# Patient Record
Sex: Female | Born: 1995 | Race: Black or African American | Hispanic: No | Marital: Single | Smoking: Former smoker
Health system: Southern US, Community
[De-identification: ages and names within clinical notes are randomized; demographics above are authoritative.]

## PROBLEM LIST (undated history)

## (undated) DIAGNOSIS — F32A Depression, unspecified: Secondary | ICD-10-CM

## (undated) DIAGNOSIS — F419 Anxiety disorder, unspecified: Secondary | ICD-10-CM

## (undated) DIAGNOSIS — F329 Major depressive disorder, single episode, unspecified: Secondary | ICD-10-CM

## (undated) DIAGNOSIS — J45909 Unspecified asthma, uncomplicated: Secondary | ICD-10-CM

## (undated) DIAGNOSIS — F209 Schizophrenia, unspecified: Secondary | ICD-10-CM

## (undated) HISTORY — PX: HERNIA REPAIR: SHX51

## (undated) HISTORY — PX: EUSTACHIAN TUBE DILATION: SHX6770

---
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
## 2015-01-30 ENCOUNTER — Emergency Department: Payer: Self-pay | Admitting: Emergency Medicine

## 2015-05-22 ENCOUNTER — Encounter: Payer: Self-pay | Admitting: Emergency Medicine

## 2015-05-22 ENCOUNTER — Emergency Department: Payer: Medicaid Other

## 2015-05-22 ENCOUNTER — Emergency Department
Admission: EM | Admit: 2015-05-22 | Discharge: 2015-05-22 | Disposition: A | Discharge status: Home / Self Care | Payer: Medicaid Other | Attending: Student | Admitting: Student

## 2015-05-22 DIAGNOSIS — Z3202 Encounter for pregnancy test, result negative: Secondary | ICD-10-CM | POA: Insufficient documentation

## 2015-05-22 DIAGNOSIS — S39012D Strain of muscle, fascia and tendon of lower back, subsequent encounter: Secondary | ICD-10-CM | POA: Diagnosis not present

## 2015-05-22 DIAGNOSIS — M545 Low back pain: Secondary | ICD-10-CM | POA: Diagnosis present

## 2015-05-22 DIAGNOSIS — Z720 Tobacco use: Secondary | ICD-10-CM | POA: Insufficient documentation

## 2015-05-22 DIAGNOSIS — X58XXXD Exposure to other specified factors, subsequent encounter: Secondary | ICD-10-CM | POA: Diagnosis not present

## 2015-05-22 LAB — URINALYSIS COMPLETE WITH MICROSCOPIC (ARMC ONLY)
Bacteria, UA: NONE SEEN
Bilirubin Urine: NEGATIVE
Glucose, UA: NEGATIVE mg/dL
Hgb urine dipstick: NEGATIVE
KETONES UR: NEGATIVE mg/dL
LEUKOCYTES UA: NEGATIVE
NITRITE: NEGATIVE
Protein, ur: NEGATIVE mg/dL
Specific Gravity, Urine: 1.019 (ref 1.005–1.030)
pH: 7 (ref 5.0–8.0)

## 2015-05-22 LAB — POCT PREGNANCY, URINE: PREG TEST UR: NEGATIVE

## 2015-05-22 MED ORDER — DIAZEPAM 5 MG PO TABS: 5.0000 mg | ORAL_TABLET | Freq: Three times a day (TID) | ORAL | Status: DC | PRN

## 2015-05-22 MED ORDER — PREDNISONE 20 MG PO TABS: 40.0000 mg | ORAL_TABLET | Freq: Every day | ORAL | Status: DC

## 2015-05-22 NOTE — ED Provider Notes (Signed)
[facility] Emergency Department Provider Note ____________________________________________  Time seen: Approximately 4:02 PM  I have reviewed the triage vital signs and the nursing notes.   HISTORY  Chief Complaint Back Pain    HPI Tracy Poole is a 19 y.o. femalepresents the ER for the complaint of low back pain. Patient states she has had a history of some low back pain since she was age 75 after pregnancy. Patient denies recent fall or injury. Patient states that this "flare "pain has been present for approximately 3 weeks. Patient states the pain is primarily with movement. Patient states the pain improves when laying on her left side and on soft bedding. Patient states that she was seen 3 weeks ago at a hospital in [place] and diagnosed with a muscle pain. States no xrays have been done on her back. Patient states that she's been taken 10 mg Flexeril as needed for pain which has not relieved her pain.  States that pain occasionally radiates into right buttocks. Patient denies numbness or tingling. Patient denies difficulty ambulating. Denies dysuria, abdominal pain or other back pain. Denies fever. Denies chest pain or shortness of breath. Reports last menstrual last week.     History reviewed. No pertinent past medical history.  There are no active problems to display for this patient.   Past Surgical History  Procedure Laterality Date  . Hernia repair      No current outpatient prescriptions on file. Flexeril  Allergies Review of patient's allergies indicates no known allergies.  No family history on file.  Social History History  Substance Use Topics  . Smoking status: Current Some Day Smoker  . Smokeless tobacco: Not on file  . Alcohol Use: No    Review of Systems Constitutional: No fever/chills Eyes: No visual changes. ENT: No sore throat. Cardiovascular: Denies chest pain. Respiratory: Denies shortness of  breath. Gastrointestinal: No abdominal pain.  No nausea, no vomiting.  No diarrhea.  No constipation. Genitourinary: Negative for dysuria. Musculoskeletal: Positive for back pain. Negative for neck pain, arm or leg pain Skin: Negative for rash. Neurological: Negative for headaches, dizziness, weakness, focal weakness or numbness.  10-point ROS otherwise negative.  ____________________________________________   PHYSICAL EXAM:                                                           --     VITAL SIGNS:  blood pressure 103/60, pulse 80, temperature 98.4 F (36.9 C), temperature source Oral, resp. rate 18, height 5\' 4"  (1.626 m), weight 197 lb (89.359 kg), last menstrual period 05/13/2015, SpO2 100 %.  Constitutional: Alert and oriented. Well appearing and in no acute distress.playing on cellphone in room. Eyes: Conjunctivae are normal. PERRL. EOMI. Head: Atraumatic. Nose: No congestion/rhinnorhea. Mouth/Throat: Mucous membranes are moist.  Oropharynx non-erythematous. Neck: No stridor.  No cervical spine tenderness to palpation. Hematological/Lymphatic/Immunilogical: No cervical lymphadenopathy. Cardiovascular: Normal rate, regular rhythm. Grossly normal heart sounds.  Good peripheral circulation. Respiratory: Normal respiratory effort.  No retractions. Lungs CTAB. Gastrointestinal: Soft and nontender. No distention. No abdominal bruits. No CVA tenderness.normal bowel sounds. Musculoskeletal: No lower extremity tenderness nor edema.  No joint effusions. No cervical or thoracic tenderness. Mild to moderate lumbar and right paralumbar tenderness with pain over the right greater sciatic notch. Changes positions quickly and  easily without difficulty or distress. Bilateral straight leg test negative. No saddle anesthesia. Steady gait. Distal pedal pulses equal bilaterally and easily found. NO CVA tenderness. Neurologic:  Normal speech and language. No gross focal neurologic  deficits are appreciated. Speech is normal. No gait instability. Skin:  Skin is warm, dry and intact. No rash noted. Psychiatric: Mood and affect are normal. Speech and behavior are normal.  ____________________________________________   LABS (all labs ordered are listed, but only abnormal results are displayed)  Labs Reviewed  URINALYSIS COMPLETEWITH MICROSCOPIC ([facility] ONLY) - Abnormal; Notable for the following:    Color, Urine YELLOW (*)    APPearance CLEAR (*)    Squamous Epithelial / LPF 0-5 (*)    All other components within normal limits  POCT PREGNANCY, URINE   ____________________________________________   RADIOLOGY  LUMBAR SPINE - COMPLETE 4+ VIEW  COMPARISON: CT of the abdomen and pelvis on 07/17/2013  FINDINGS: There is no evidence of lumbar spine fracture. Alignment is normal. Intervertebral disc spaces are maintained.  IMPRESSION: Negative.   Electronically Signed By: Norva Pavlov M.D. On: 05/22/2015 16:46 ____________________________________________   INITIAL IMPRESSION / ASSESSMENT AND PLAN / ED COURSE  Pertinent labs & imaging results that were available during my care of the patient were reviewed by me and considered in my medical decision making (see chart for details).  No acute distress. Very well-appearing patient. Presents the ER for complaints of low back pain with intermittent radiation down her right buttocks 3 weeks. Reports history of similar. Denies other complaints. Changes positions quickly and easily without distress.  Xray negative and urine negative for UTI. Suspect lumbosacral strain. Will treat with oral valium, STOP flexeril, and prednisone. Follow up with PCP. Pt agreed to plan.  ____________________________________________   FINAL CLINICAL IMPRESSION(S) / ED DIAGNOSES  Final diagnoses:  Lumbosacral strain, subsequent encounter  Low back pain without sciatica, unspecified back pain laterality      Renford Dills, NP 05/22/15 1734  Renford Dills, NP 05/22/15 1735  Gayla Doss, MD 05/22/15 2101

## 2015-05-22 NOTE — ED Notes (Signed)
Right lower back pain radiating to right leg , no injury .

## 2015-05-22 NOTE — ED Notes (Signed)
Lower back and right leg pain x3 weeks. Ambulatory to triage.

## 2015-05-22 NOTE — Discharge Instructions (Signed)
Take medication as prescribed. Alternate heat and ice for comfort. Stretch well. Avoid overly strenuous activity. Follow up with your primary care physician this week. Follow up with the above as needed.  Return to the ER for new or worsening concerns.  Back Exercises Back exercises help treat and prevent back injuries. The goal of back exercises is to increase the strength of your abdominal and back muscles and the flexibility of your back. These exercises should be started when you no longer have back pain. Back exercises include:  Pelvic Tilt. Lie on your back with your knees bent. Tilt your pelvis until the lower part of your back is against the floor. Hold this position 5 to 10 sec and repeat 5 to 10 times.  Knee to Chest. Pull first 1 knee up against your chest and hold for 20 to 30 seconds, repeat this with the other knee, and then both knees. This may be done with the other leg straight or bent, whichever feels better.  Sit-Ups or Curl-Ups. Bend your knees 90 degrees. Start with tilting your pelvis, and do a partial, slow sit-up, lifting your trunk only 30 to 45 degrees off the floor. Take at least 2 to 3 seconds for each sit-up. Do not do sit-ups with your knees out straight. If partial sit-ups are difficult, simply do the above but with only tightening your abdominal muscles and holding it as directed.  Hip-Lift. Lie on your back with your knees flexed 90 degrees. Push down with your feet and shoulders as you raise your hips a couple inches off the floor; hold for 10 seconds, repeat 5 to 10 times.  Back arches. Lie on your stomach, propping yourself up on bent elbows. Slowly press on your hands, causing an arch in your low back. Repeat 3 to 5 times. Any initial stiffness and discomfort should lessen with repetition over time.  Shoulder-Lifts. Lie face down with arms beside your body. Keep hips and torso pressed to floor as you slowly lift your head and shoulders off the floor. Do not  overdo your exercises, especially in the beginning. Exercises may cause you some mild back discomfort which lasts for a few minutes; however, if the pain is more severe, or lasts for more than 15 minutes, do not continue exercises until you see your caregiver. Improvement with exercise therapy for back problems is slow.  See your caregivers for assistance with developing a proper back exercise program. Document Released: 12/20/2004 Document Revised: 02/04/2012 Document Reviewed: 09/13/2011 [facility] Patient Information 2015 [place], [place]. This information is not intended to replace advice given to you by your health care provider. Make sure you discuss any questions you have with your health care provider.

## 2015-08-19 ENCOUNTER — Emergency Department
Admission: EM | Admit: 2015-08-19 | Discharge: 2015-08-19 | Discharge status: Against Medical Advice | Payer: Medicaid Other | Attending: Emergency Medicine | Admitting: Emergency Medicine

## 2015-08-19 ENCOUNTER — Encounter: Payer: Self-pay | Admitting: Emergency Medicine

## 2015-08-19 DIAGNOSIS — Z720 Tobacco use: Secondary | ICD-10-CM | POA: Diagnosis not present

## 2015-08-19 DIAGNOSIS — R1032 Left lower quadrant pain: Secondary | ICD-10-CM | POA: Diagnosis not present

## 2015-08-19 LAB — CBC WITH DIFFERENTIAL/PLATELET
BASOS PCT: 1 %
Basophils Absolute: 0 10*3/uL (ref 0–0.1)
Eosinophils Absolute: 0.2 10*3/uL (ref 0–0.7)
Eosinophils Relative: 3 %
HCT: 38.5 % (ref 35.0–47.0)
Hemoglobin: 13.1 g/dL (ref 12.0–16.0)
Lymphocytes Relative: 27 %
Lymphs Abs: 2.1 10*3/uL (ref 1.0–3.6)
MCH: 30.2 pg (ref 26.0–34.0)
MCHC: 34.1 g/dL (ref 32.0–36.0)
MCV: 88.7 fL (ref 80.0–100.0)
Monocytes Absolute: 0.6 10*3/uL (ref 0.2–0.9)
Monocytes Relative: 7 %
NEUTROS ABS: 4.8 10*3/uL (ref 1.4–6.5)
Neutrophils Relative %: 62 %
Platelets: 152 10*3/uL (ref 150–440)
RBC: 4.35 MIL/uL (ref 3.80–5.20)
RDW: 13.4 % (ref 11.5–14.5)
WBC: 7.7 10*3/uL (ref 3.6–11.0)

## 2015-08-19 LAB — URINALYSIS COMPLETE WITH MICROSCOPIC (ARMC ONLY)
BILIRUBIN URINE: NEGATIVE
Glucose, UA: NEGATIVE mg/dL
Hgb urine dipstick: NEGATIVE
Ketones, ur: NEGATIVE mg/dL
NITRITE: NEGATIVE
Protein, ur: NEGATIVE mg/dL
Specific Gravity, Urine: 1.006 (ref 1.005–1.030)
pH: 7 (ref 5.0–8.0)

## 2015-08-19 LAB — COMPREHENSIVE METABOLIC PANEL
ALBUMIN: 4.1 g/dL (ref 3.5–5.0)
ALT: 28 U/L (ref 14–54)
ANION GAP: 6 (ref 5–15)
AST: 23 U/L (ref 15–41)
Alkaline Phosphatase: 72 U/L (ref 38–126)
BUN: 9 mg/dL (ref 6–20)
CALCIUM: 9 mg/dL (ref 8.9–10.3)
CO2: 25 mmol/L (ref 22–32)
Chloride: 105 mmol/L (ref 101–111)
Creatinine, Ser: 0.68 mg/dL (ref 0.44–1.00)
GFR calc Af Amer: >60 mL/min (ref >60)
GFR calc non Af Amer: >60 mL/min (ref >60)
GLUCOSE: 103 mg/dL — AB (ref 65–99)
Potassium: 4 mmol/L (ref 3.5–5.1)
SODIUM: 136 mmol/L (ref 135–145)
Total Bilirubin: 0.9 mg/dL (ref 0.3–1.2)
Total Protein: 7 g/dL (ref 6.5–8.1)

## 2015-08-19 LAB — POCT PREGNANCY, URINE: Preg Test, Ur: NEGATIVE

## 2015-08-19 NOTE — ED Notes (Signed)
Pt to ED with c/o LLQ abd. Pain that radiates toward back, states pain was worse yesterday and a little better today, denies any n,v,d or urinary symptoms

## 2015-08-22 ENCOUNTER — Telehealth: Payer: Self-pay | Admitting: Emergency Medicine

## 2015-08-22 NOTE — ED Notes (Signed)
Called patient due to lwot to inquire about condition and follow up plans. Patient says she is feeling better now, but not she has a cold.  Her pcp is at [facility].  She agrees to call them and inform them of the ed visit and that we did labs here.

## 2016-04-07 ENCOUNTER — Emergency Department: Payer: Medicaid Other

## 2016-04-07 ENCOUNTER — Emergency Department
Admission: EM | Admit: 2016-04-07 | Discharge: 2016-04-07 | Disposition: A | Discharge status: Home / Self Care | Payer: Medicaid Other | Attending: Emergency Medicine | Admitting: Emergency Medicine

## 2016-04-07 ENCOUNTER — Encounter: Payer: Self-pay | Admitting: Emergency Medicine

## 2016-04-07 DIAGNOSIS — Y9389 Activity, other specified: Secondary | ICD-10-CM | POA: Insufficient documentation

## 2016-04-07 DIAGNOSIS — Y999 Unspecified external cause status: Secondary | ICD-10-CM | POA: Insufficient documentation

## 2016-04-07 DIAGNOSIS — S0990XA Unspecified injury of head, initial encounter: Secondary | ICD-10-CM

## 2016-04-07 DIAGNOSIS — Y929 Unspecified place or not applicable: Secondary | ICD-10-CM | POA: Diagnosis not present

## 2016-04-07 DIAGNOSIS — F17210 Nicotine dependence, cigarettes, uncomplicated: Secondary | ICD-10-CM | POA: Insufficient documentation

## 2016-04-07 LAB — POCT PREGNANCY, URINE: Preg Test, Ur: NEGATIVE

## 2016-04-07 MED ORDER — NAPROXEN 500 MG PO TABS: 500.0000 mg | ORAL_TABLET | Freq: Two times a day (BID) | ORAL | Status: DC

## 2016-04-07 NOTE — Discharge Instructions (Signed)
Concussion, Adult  A concussion, or closed-head injury, is a brain injury caused by a direct blow to the head or by a quick and sudden movement (jolt) of the head or neck. Concussions are usually not life-threatening. Even so, the effects of a concussion can be serious. If you have had a concussion before, you are more likely to experience concussion-like symptoms after a direct blow to the head.   CAUSES  · Direct blow to the head, such as from running into another player during a soccer game, being hit in a fight, or hitting your head on a hard surface.  · A jolt of the head or neck that causes the brain to move back and forth inside the skull, such as in a car crash.  SIGNS AND SYMPTOMS  The signs of a concussion can be hard to notice. Early on, they may be missed by you, family members, and health care providers. You may look fine but act or feel differently.  Symptoms are usually temporary, but they may last for days, weeks, or even longer. Some symptoms may appear right away while others may not show up for hours or days. Every head injury is different. Symptoms include:  · Mild to moderate headaches that will not go away.  · A feeling of pressure inside your head.  · Having more trouble than usual:    Learning or remembering things you have heard.    Answering questions.    Paying attention or concentrating.    Organizing daily tasks.    Making decisions and solving problems.  · Slowness in thinking, acting or reacting, speaking, or reading.  · Getting lost or being easily confused.  · Feeling tired all the time or lacking energy (fatigued).  · Feeling drowsy.  · Sleep disturbances.    Sleeping more than usual.    Sleeping less than usual.    Trouble falling asleep.    Trouble sleeping (insomnia).  · Loss of balance or feeling lightheaded or dizzy.  · Nausea or vomiting.  · Numbness or tingling.  · Increased sensitivity to:    Sounds.    Lights.    Distractions.  · Vision problems or eyes that tire  easily.  · Diminished sense of taste or smell.  · Ringing in the ears.  · Mood changes such as feeling sad or anxious.  · Becoming easily irritated or angry for little or no reason.  · Lack of motivation.  · Seeing or hearing things other people do not see or hear (hallucinations).  DIAGNOSIS  Your health care provider can usually diagnose a concussion based on a description of your injury and symptoms. He or she will ask whether you passed out (lost consciousness) and whether you are having trouble remembering events that happened right before and during your injury.  Your evaluation might include:  · A brain scan to look for signs of injury to the brain. Even if the test shows no injury, you may still have a concussion.  · Blood tests to be sure other problems are not present.  TREATMENT  · Concussions are usually treated in an emergency department, in urgent care, or at a clinic. You may need to stay in the hospital overnight for further treatment.  · Tell your health care provider if you are taking any medicines, including prescription medicines, over-the-counter medicines, and natural remedies. Some medicines, such as blood thinners (anticoagulants) and aspirin, may increase the chance of complications. Also tell your health care   provider whether you have had alcohol or are taking illegal drugs. This information may affect treatment.  · Your health care provider will send you home with important instructions to follow.  · How fast you will recover from a concussion depends on many factors. These factors include how severe your concussion is, what part of your brain was injured, your age, and how healthy you were before the concussion.  · Most people with mild injuries recover fully. Recovery can take time. In general, recovery is slower in older persons. Also, persons who have had a concussion in the past or have other medical problems may find that it takes longer to recover from their current injury.  HOME  CARE INSTRUCTIONS  General Instructions  · Carefully follow the directions your health care provider gave you.  · Only take over-the-counter or prescription medicines for pain, discomfort, or fever as directed by your health care provider.  · Take only those medicines that your health care provider has approved.  · Do not drink alcohol until your health care provider says you are well enough to do so. Alcohol and certain other drugs may slow your recovery and can put you at risk of further injury.  · If it is harder than usual to remember things, write them down.  · If you are easily distracted, try to do one thing at a time. For example, do not try to watch TV while fixing dinner.  · Talk with family members or close friends when making important decisions.  · Keep all follow-up appointments. Repeated evaluation of your symptoms is recommended for your recovery.  · Watch your symptoms and tell others to do the same. Complications sometimes occur after a concussion. Older adults with a brain injury may have a higher risk of serious complications, such as a blood clot on the brain.  · Tell your teachers, school nurse, school counselor, coach, athletic trainer, or work manager about your injury, symptoms, and restrictions. Tell them about what you can or cannot do. They should watch for:    Increased problems with attention or concentration.    Increased difficulty remembering or learning new information.    Increased time needed to complete tasks or assignments.    Increased irritability or decreased ability to cope with stress.    Increased symptoms.  · Rest. Rest helps the brain to heal. Make sure you:    Get plenty of sleep at night. Avoid staying up late at night.    Keep the same bedtime hours on weekends and weekdays.    Rest during the day. Take daytime naps or rest breaks when you feel tired.  · Limit activities that require a lot of thought or concentration. These include:    Doing homework or job-related  work.    Watching TV.    Working on the computer.  · Avoid any situation where there is potential for another head injury (football, hockey, soccer, basketball, martial arts, downhill snow sports and horseback riding). Your condition will get worse every time you experience a concussion. You should avoid these activities until you are evaluated by the appropriate follow-up health care providers.  Returning To Your Regular Activities  You will need to return to your normal activities slowly, not all at once. You must give your body and brain enough time for recovery.  · Do not return to sports or other athletic activities until your health care provider tells you it is safe to do so.  · Ask   your health care provider when you can drive, ride a bicycle, or operate heavy machinery. Your ability to react may be slower after a brain injury. Never do these activities if you are dizzy.  · Ask your health care provider about when you can return to work or school.  Preventing Another Concussion  It is very important to avoid another brain injury, especially before you have recovered. In rare cases, another injury can lead to permanent brain damage, brain swelling, or death. The risk of this is greatest during the first 7-10 days after a head injury. Avoid injuries by:  · Wearing a seat belt when riding in a car.  · Drinking alcohol only in moderation.  · Wearing a helmet when biking, skiing, skateboarding, skating, or doing similar activities.  · Avoiding activities that could lead to a second concussion, such as contact or recreational sports, until your health care provider says it is okay.  · Taking safety measures in your home.    Remove clutter and tripping hazards from floors and stairways.    Use grab bars in bathrooms and handrails by stairs.    Place non-slip mats on floors and in bathtubs.    Improve lighting in dim areas.  SEEK MEDICAL CARE IF:  · You have increased problems paying attention or  concentrating.  · You have increased difficulty remembering or learning new information.  · You need more time to complete tasks or assignments than before.  · You have increased irritability or decreased ability to cope with stress.  · You have more symptoms than before.  Seek medical care if you have any of the following symptoms for more than 2 weeks after your injury:  · Lasting (chronic) headaches.  · Dizziness or balance problems.  · Nausea.  · Vision problems.  · Increased sensitivity to noise or light.  · Depression or mood swings.  · Anxiety or irritability.  · Memory problems.  · Difficulty concentrating or paying attention.  · Sleep problems.  · Feeling tired all the time.  SEEK IMMEDIATE MEDICAL CARE IF:  · You have severe or worsening headaches. These may be a sign of a blood clot in the brain.  · You have weakness (even if only in one hand, leg, or part of the face).  · You have numbness.  · You have decreased coordination.  · You vomit repeatedly.  · You have increased sleepiness.  · One pupil is larger than the other.  · You have convulsions.  · You have slurred speech.  · You have increased confusion. This may be a sign of a blood clot in the brain.  · You have increased restlessness, agitation, or irritability.  · You are unable to recognize people or places.  · You have neck pain.  · It is difficult to wake you up.  · You have unusual behavior changes.  · You lose consciousness.  MAKE SURE YOU:  · Understand these instructions.  · Will watch your condition.  · Will get help right away if you are not doing well or get worse.     This information is not intended to replace advice given to you by your health care provider. Make sure you discuss any questions you have with your health care provider.     Document Released: 02/02/2004 Document Revised: 12/03/2014 Document Reviewed: 06/04/2013  [employer]vier Interactive Patient Education ©2016 [employer]
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
## 2016-04-07 NOTE — ED Notes (Signed)
Discussed discharge instructions, prescriptions, and follow-up care with patient. No questions or concerns at this time. Pt stable at discharge.  
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
## 2017-02-26 ENCOUNTER — Encounter: Payer: Self-pay | Admitting: Emergency Medicine

## 2017-02-26 ENCOUNTER — Emergency Department
Admission: EM | Admit: 2017-02-26 | Discharge: 2017-02-26 | Disposition: A | Discharge status: Home / Self Care | Payer: Medicaid Other | Attending: Emergency Medicine | Admitting: Emergency Medicine

## 2017-02-26 DIAGNOSIS — G8929 Other chronic pain: Secondary | ICD-10-CM | POA: Diagnosis not present

## 2017-02-26 DIAGNOSIS — M545 Low back pain, unspecified: Secondary | ICD-10-CM

## 2017-02-26 DIAGNOSIS — Z79899 Other long term (current) drug therapy: Secondary | ICD-10-CM | POA: Diagnosis not present

## 2017-02-26 DIAGNOSIS — R1012 Left upper quadrant pain: Secondary | ICD-10-CM

## 2017-02-26 DIAGNOSIS — F17210 Nicotine dependence, cigarettes, uncomplicated: Secondary | ICD-10-CM | POA: Diagnosis not present

## 2017-02-26 LAB — URINALYSIS, COMPLETE (UACMP) WITH MICROSCOPIC
Bacteria, UA: NONE SEEN
Bilirubin Urine: NEGATIVE
GLUCOSE, UA: NEGATIVE mg/dL
Hgb urine dipstick: NEGATIVE
Ketones, ur: NEGATIVE mg/dL
Leukocytes, UA: NEGATIVE
NITRITE: NEGATIVE
PH: 6 (ref 5.0–8.0)
Protein, ur: NEGATIVE mg/dL
Specific Gravity, Urine: 1.019 (ref 1.005–1.030)

## 2017-02-26 LAB — CBC
HCT: 37.8 % (ref 35.0–47.0)
Hemoglobin: 13 g/dL (ref 12.0–16.0)
MCH: 29.7 pg (ref 26.0–34.0)
MCHC: 34.3 g/dL (ref 32.0–36.0)
MCV: 86.7 fL (ref 80.0–100.0)
PLATELETS: 178 10*3/uL (ref 150–440)
RBC: 4.36 MIL/uL (ref 3.80–5.20)
RDW: 13.8 % (ref 11.5–14.5)
WBC: 9.2 10*3/uL (ref 3.6–11.0)

## 2017-02-26 LAB — COMPREHENSIVE METABOLIC PANEL
ALT: 39 U/L (ref 14–54)
AST: 33 U/L (ref 15–41)
Albumin: 4 g/dL (ref 3.5–5.0)
Alkaline Phosphatase: 69 U/L (ref 38–126)
Anion gap: 6 (ref 5–15)
BUN: 7 mg/dL (ref 6–20)
CALCIUM: 9 mg/dL (ref 8.9–10.3)
CO2: 25 mmol/L (ref 22–32)
CREATININE: 0.7 mg/dL (ref 0.44–1.00)
Chloride: 105 mmol/L (ref 101–111)
GFR calc Af Amer: >60 mL/min (ref >60)
GFR calc non Af Amer: >60 mL/min (ref >60)
Glucose, Bld: 79 mg/dL (ref 65–99)
Potassium: 3.3 mmol/L — ABNORMAL LOW (ref 3.5–5.1)
Sodium: 136 mmol/L (ref 135–145)
TOTAL PROTEIN: 7.2 g/dL (ref 6.5–8.1)
Total Bilirubin: 0.5 mg/dL (ref 0.3–1.2)

## 2017-02-26 LAB — POCT PREGNANCY, URINE: Preg Test, Ur: NEGATIVE

## 2017-02-26 LAB — LIPASE, BLOOD: Lipase: 20 U/L (ref 11–51)

## 2017-02-26 MED ORDER — FAMOTIDINE 20 MG PO TABS
40.0000 mg | ORAL_TABLET | Freq: Once | ORAL | Status: AC
  Administered 2017-02-26: 40 mg via ORAL
  Filled 2017-02-26: qty 2

## 2017-02-26 MED ORDER — GI COCKTAIL ~~LOC~~
30.0000 mL | Freq: Once | ORAL | Status: AC
  Administered 2017-02-26: 30 mL via ORAL
  Filled 2017-02-26: qty 30

## 2017-02-26 MED ORDER — FAMOTIDINE 40 MG PO TABS: 20.0000 mg | ORAL_TABLET | Freq: Two times a day (BID) | ORAL | 0 refills | Status: DC

## 2017-02-26 NOTE — Discharge Instructions (Signed)
Please take your antacid medication twice a day for a full month. Return to the emergency department for any new or worsening symptoms such as if she cannot eat or drink, worsening pain, or for any other concerns. Otherwise follow up with your primary care physician as needed.  It was a pleasure to take care of you today, and thank you for coming to our emergency department.  If you have any questions or concerns before leaving please ask the nurse to grab me and I'm more than happy to go through your aftercare instructions again.  If you were prescribed any opioid pain medication today such as Norco, Vicodin, Percocet, morphine, hydrocodone, or oxycodone please make sure you do not drive when you are taking this medication as it can alter your ability to drive safely.  If you have any concerns once you are home that you are not improving or are in fact getting worse before you can make it to your follow-up appointment, please do not hesitate to call 911 and come back for further evaluation.  Merrily Brittle MD  Results for orders placed or performed during the hospital encounter of 02/26/17  Lipase, blood  Result Value Ref Range   Lipase 20 11 - 51 U/L  Comprehensive metabolic panel  Result Value Ref Range   Sodium 136 135 - 145 mmol/L   Potassium 3.3 (L) 3.5 - 5.1 mmol/L   Chloride 105 101 - 111 mmol/L   CO2 25 22 - 32 mmol/L   Glucose, Bld 79 65 - 99 mg/dL   BUN 7 6 - 20 mg/dL   Creatinine, Ser 1.61 0.44 - 1.00 mg/dL   Calcium 9.0 8.9 - 09.6 mg/dL   Total Protein 7.2 6.5 - 8.1 g/dL   Albumin 4.0 3.5 - 5.0 g/dL   AST 33 15 - 41 U/L   ALT 39 14 - 54 U/L   Alkaline Phosphatase 69 38 - 126 U/L   Total Bilirubin 0.5 0.3 - 1.2 mg/dL   GFR calc non Af Amer >60 >60 mL/min   GFR calc Af Amer >60 >60 mL/min   Anion gap 6 5 - 15  CBC  Result Value Ref Range   WBC 9.2 3.6 - 11.0 K/uL   RBC 4.36 3.80 - 5.20 MIL/uL   Hemoglobin 13.0 12.0 - 16.0 g/dL   HCT 04.5 40.9 - 81.1 %   MCV 86.7 80.0  - 100.0 fL   MCH 29.7 26.0 - 34.0 pg   MCHC 34.3 32.0 - 36.0 g/dL   RDW 91.4 78.2 - 95.6 %   Platelets 178 150 - 440 K/uL  Urinalysis, Complete w Microscopic  Result Value Ref Range   Color, Urine YELLOW (A) YELLOW   APPearance CLEAR (A) CLEAR   Specific Gravity, Urine 1.019 1.005 - 1.030   pH 6.0 5.0 - 8.0   Glucose, UA NEGATIVE NEGATIVE mg/dL   Hgb urine dipstick NEGATIVE NEGATIVE   Bilirubin Urine NEGATIVE NEGATIVE   Ketones, ur NEGATIVE NEGATIVE mg/dL   Protein, ur NEGATIVE NEGATIVE mg/dL   Nitrite NEGATIVE NEGATIVE   Leukocytes, UA NEGATIVE NEGATIVE   RBC / HPF 0-5 0 - 5 RBC/hpf   WBC, UA 0-5 0 - 5 WBC/hpf   Bacteria, UA NONE SEEN NONE SEEN   Squamous Epithelial / LPF 0-5 (A) NONE SEEN   Mucous PRESENT   Pregnancy, urine POC  Result Value Ref Range   Preg Test, Ur NEGATIVE NEGATIVE

## 2017-02-26 NOTE — ED Triage Notes (Signed)
Patient to ER for c/o left mid abd pain. Patient states she has chronic back pain, has noticed pain with walking to her back, but now having pain to side as well with walking. Patient unsure of pregnancy status and states she wants blood test done to test for pregnancy.

## 2017-02-26 NOTE — ED Notes (Signed)
Discussed lab results with patient, as well as advised of status (wait time). Patient awaiting room for MD eval. No other needs at this time. Patient in no acute distress.

## 2017-02-26 NOTE — ED Provider Notes (Signed)
[facility] Emergency Department Provider Note  ____________________________________________   First MD Initiated Contact with Patient 02/26/17 [phone]     (approximate)  I have reviewed the triage vital signs and the nursing notes.   HISTORY  Chief Complaint Abdominal Pain    HPI Tracy Poole is a 21 y.o. female who self presents to the emergency department with 2 issues. Today while walking to get dinner she felt moderate severity aching left upper quadrant burning sensation. Pain waxes and wanes. Nothing in particular makes it better or worse. It was not different after eating. She has had normal bowel movements and flatus. She had an umbilical hernia repair in the past. She's had no fevers or chills. She also has a one-month history of aching low back pain and figured that since she was coming to the emergency department anyway she would like that evaluated. The pain is been intermittent aching cramping mild to moderate severity and nonradiating. She's never had x-rays. No trauma. No fevers or chills. No steroids. No intravenous drug use. She has been intermittently taking naproxen with minimal relief.   History reviewed. No pertinent past medical history.  There are no active problems to display for this patient.   Past Surgical History:  Procedure Laterality Date  . HERNIA REPAIR      Prior to Admission medications   Medication Sig Start Date End Date Taking? Authorizing Provider  diazepam (VALIUM) 5 MG tablet Take 1 tablet (5 mg total) by mouth every 8 (eight) hours as needed for muscle spasms (do not drive or operate machinery while taking as can cause drowsiness.). 05/22/15   Renford Dills, NP  famotidine (PEPCID) 40 MG tablet Take 0.5 tablets (20 mg total) by mouth 2 (two) times daily. 02/26/17 03/28/17  Merrily Brittle, MD  naproxen (NAPROSYN) 500 MG tablet Take 1 tablet (500 mg total) by mouth 2 (two) times daily with a meal. 04/07/16   Christella Scheuermann,  PA-C  predniSONE (DELTASONE) 20 MG tablet Take 2 tablets (40 mg total) by mouth daily. 05/22/15   Renford Dills, NP    Allergies Patient has no known allergies.  No family history on file.  Social History Social History  Substance Use Topics  . Smoking status: Current Some Day Smoker    Packs/day: 1.00    Types: Cigarettes  . Smokeless tobacco: Never Used  . Alcohol use No    Review of Systems Constitutional: No fever/chills Eyes: No visual changes. ENT: No sore throat. Cardiovascular: Denies chest pain. Respiratory: Denies shortness of breath. Gastrointestinal: Positive abdominal pain.  No nausea, no vomiting.  No diarrhea.  No constipation. Genitourinary: Negative for dysuria. Musculoskeletal: Positive for back pain. Skin: Negative for rash. Neurological: Negative for headaches, focal weakness or numbness.  10-point ROS otherwise negative.  ____________________________________________   PHYSICAL EXAM:  VITAL SIGNS: ED Triage Vitals  Enc Vitals Group     BP 02/26/17 0010 (!) 146/74     Pulse Rate 02/26/17 0010 93     Resp 02/26/17 0010 18     Temp 02/26/17 0010 98.3 F (36.8 C)     Temp Source 02/26/17 0010 Oral     SpO2 02/26/17 0010 100 %     Weight 02/26/17 0011 215 lb (97.5 kg)     Height 02/26/17 0011  (1.626 m)     Head Circumference --      Peak Flow --      Pain Score 02/26/17 0011 7  Pain Loc --      Pain Edu? --      Excl. in GC? --     Constitutional: Alert and oriented x 4 well appearing nontoxic no diaphoresis speaks in full, clear sentences Eyes: PERRL EOMI. Head: Atraumatic. Nose: No congestion/rhinnorhea. Mouth/Throat: No trismus Neck: No stridor.   Cardiovascular: Normal rate, regular rhythm. Grossly normal heart sounds.  Good peripheral circulation. Respiratory: Normal respiratory effort.  No retractions. Lungs CTAB and moving good air Gastrointestinal: Soft nondistended nontender no rebound no guarding no peritonitis no  McBurney's tenderness negative Rovsing's no costovertebral tenderness negative Murphy's Musculoskeletal: No lower extremity edema   Neurologic:  Normal speech and language. No gross focal neurologic deficits are appreciated. Skin:  Skin is warm, dry and intact. No rash noted. Psychiatric: Mood and affect are normal. Speech and behavior are normal.    ____________________________________________   DIFFERENTIAL  Appendicitis, diverticulitis, cholecystitis, biliary colic, gastritis ____________________________________________   LABS (all labs ordered are listed, but only abnormal results are displayed)  Labs Reviewed  COMPREHENSIVE METABOLIC PANEL - Abnormal; Notable for the following:       Result Value   Potassium 3.3 (*)    All other components within normal limits  URINALYSIS, COMPLETE (UACMP) WITH MICROSCOPIC - Abnormal; Notable for the following:    Color, Urine YELLOW (*)    APPearance CLEAR (*)    Squamous Epithelial / LPF 0-5 (*)    All other components within normal limits  LIPASE, BLOOD  CBC  POC URINE PREG, ED  POCT PREGNANCY, URINE    Labs reassuring. No pancreatitis and normal renal function  EKG   ____________________________________________  RADIOLOGY   ____________________________________________   PROCEDURES  Procedure(s) performed: no  Procedures  Critical Care performed: no  ____________________________________________   INITIAL IMPRESSION / ASSESSMENT AND PLAN / ED COURSE  Pertinent labs & imaging results that were available during my care of the patient were reviewed by me and considered in my medical decision making (see chart for details).  The patient arrives very well-appearing with mild left upper quadrant pain. She is currently in no pain. Her abdomen is benign and nonsurgical. He did not believe she warrants advanced imaging at this time. I will give her a trial of H2 blocker and refer her back to her primary care physician.  Regarding her back pain of advised her to continue losing weight and to continue exercising as she has done and that it would be a good idea to take up yoga. She has normal sensation throughout. No red flags for back pain.      ____________________________________________   FINAL CLINICAL IMPRESSION(S) / ED DIAGNOSES  Final diagnoses:  Left upper quadrant pain  Chronic right-sided low back pain without sciatica      NEW MEDICATIONS STARTED DURING THIS VISIT:  New Prescriptions   FAMOTIDINE (PEPCID) 40 MG TABLET    Take 0.5 tablets (20 mg total) by mouth 2 (two) times daily.     Note:  This document was prepared using Dragon voice recognition software and may include unintentional dictation errors.     Merrily Brittle, MD 02/26/17 [phone]

## 2017-02-26 NOTE — ED Notes (Addendum)
Patient advised of wait time. No other needs at this time.

## 2017-03-23 ENCOUNTER — Emergency Department
Admission: EM | Admit: 2017-03-23 | Discharge: 2017-03-24 | Disposition: A | Discharge status: Home / Self Care | Payer: Medicaid Other | Attending: Emergency Medicine | Admitting: Emergency Medicine

## 2017-03-23 ENCOUNTER — Encounter: Payer: Self-pay | Admitting: Emergency Medicine

## 2017-03-23 DIAGNOSIS — Z3201 Encounter for pregnancy test, result positive: Secondary | ICD-10-CM

## 2017-03-23 DIAGNOSIS — F17210 Nicotine dependence, cigarettes, uncomplicated: Secondary | ICD-10-CM | POA: Diagnosis not present

## 2017-03-23 DIAGNOSIS — R51 Headache: Secondary | ICD-10-CM | POA: Insufficient documentation

## 2017-03-23 DIAGNOSIS — R519 Headache, unspecified: Secondary | ICD-10-CM

## 2017-03-23 DIAGNOSIS — Z3A01 Less than 8 weeks gestation of pregnancy: Secondary | ICD-10-CM | POA: Insufficient documentation

## 2017-03-23 DIAGNOSIS — O99331 Smoking (tobacco) complicating pregnancy, first trimester: Secondary | ICD-10-CM | POA: Diagnosis not present

## 2017-03-23 DIAGNOSIS — O26891 Other specified pregnancy related conditions, first trimester: Secondary | ICD-10-CM | POA: Diagnosis present

## 2017-03-23 DIAGNOSIS — Z79899 Other long term (current) drug therapy: Secondary | ICD-10-CM | POA: Diagnosis not present

## 2017-03-23 HISTORY — DX: Major depressive disorder, single episode, unspecified: F32.9

## 2017-03-23 HISTORY — DX: Depression, unspecified: F32.A

## 2017-03-23 LAB — URINALYSIS, COMPLETE (UACMP) WITH MICROSCOPIC
BILIRUBIN URINE: NEGATIVE
Bacteria, UA: NONE SEEN
Glucose, UA: NEGATIVE mg/dL
Hgb urine dipstick: NEGATIVE
KETONES UR: NEGATIVE mg/dL
Leukocytes, UA: NEGATIVE
Nitrite: NEGATIVE
PH: 6 (ref 5.0–8.0)
Protein, ur: NEGATIVE mg/dL
Specific Gravity, Urine: 1.02 (ref 1.005–1.030)

## 2017-03-23 LAB — POCT PREGNANCY, URINE: Preg Test, Ur: POSITIVE — AB

## 2017-03-23 NOTE — ED Triage Notes (Signed)
Pt arrives POV ambulatory to triage with c/o possible pregnancy, headache, and multiple medical complaints. Pt states that she had a positive pregnancy test at home but wanted to "make sure". Pt is in NAD at this time.

## 2017-03-24 MED ORDER — ACETAMINOPHEN 325 MG PO TABS
650.0000 mg | ORAL_TABLET | Freq: Once | ORAL | Status: AC
  Administered 2017-03-24: 650 mg via ORAL
  Filled 2017-03-24: qty 2

## 2017-03-24 NOTE — ED Provider Notes (Signed)
[facility] Emergency Department Provider Note   ____________________________________________   None    (approximate)  I have reviewed the triage vital signs and the nursing notes.   HISTORY  Chief Complaint Possible Pregnancy and Headache    HPI Tracy Poole is a 21 y.o. female who comes into the hospital today to confirm a pregnancy test and with a headache. The patient reports that her last period was sometime for last month. When asked if it was March of February she reports that she doesn't really keep up with when she gets her period. She reports that she is also had a headache for a couple of days. She took some ibuprofen but reports that the pain is currently a 6 out of 10 in intensity. She is post to be on Abilify and hydroxyzine for depression but after finding out that she was pregnant she stopped taking the medication. The family reports that she has had some of these headaches even before then. The patient has had nausea and increased stress. She hasn't really been eating or drinking much over the last few days. She reports that today is the first day that she's been eating and drinking. The patient is here for evaluation. The patient denies any abdominal pain any vomiting any back pain blurry vision.The patient has not had any vaginal discharge or bleeding.   Past Medical History:  Diagnosis Date  . Depression     There are no active problems to display for this patient.   Past Surgical History:  Procedure Laterality Date  . EUSTACHIAN TUBE DILATION    . HERNIA REPAIR      Prior to Admission medications   Medication Sig Start Date End Date Taking? Authorizing Provider  diazepam (VALIUM) 5 MG tablet Take 1 tablet (5 mg total) by mouth every 8 (eight) hours as needed for muscle spasms (do not drive or operate machinery while taking as can cause drowsiness.). 05/22/15   Renford Dills, NP  famotidine (PEPCID) 40 MG tablet Take 0.5 tablets  (20 mg total) by mouth 2 (two) times daily. 02/26/17 03/28/17  Merrily Brittle, MD  naproxen (NAPROSYN) 500 MG tablet Take 1 tablet (500 mg total) by mouth 2 (two) times daily with a meal. 04/07/16   Christella Scheuermann, PA-C  predniSONE (DELTASONE) 20 MG tablet Take 2 tablets (40 mg total) by mouth daily. 05/22/15   Renford Dills, NP    Allergies Patient has no known allergies.  No family history on file.  Social History Social History  Substance Use Topics  . Smoking status: Current Some Day Smoker    Packs/day: 1.00    Types: Cigarettes  . Smokeless tobacco: Never Used  . Alcohol use No    Review of Systems  Constitutional: No fever/chills Eyes: No visual changes. ENT: No sore throat. Cardiovascular: Denies chest pain. Respiratory: Denies shortness of breath. Gastrointestinal: Nausea with No abdominal pain. no vomiting.  No diarrhea.  No constipation. Genitourinary: Negative for dysuria. Musculoskeletal: Negative for back pain. Skin: Negative for rash. Neurological: Headache   ____________________________________________   PHYSICAL EXAM:  VITAL SIGNS: ED Triage Vitals  Enc Vitals Group     BP 03/23/17 2220 120/64     Pulse Rate 03/23/17 2220 (!) 102     Resp 03/23/17 2220 18     Temp 03/23/17 2220 99.2 F (37.3 C)     Temp Source 03/23/17 2220 Oral     SpO2 03/23/17 2220 100 %  Weight 03/23/17 2219 220 lb (99.8 kg)     Height 03/23/17 2219  (1.626 m)     Head Circumference --      Peak Flow --      Pain Score 03/23/17 2219 5     Pain Loc --      Pain Edu? --      Excl. in GC? --     Constitutional: Alert and oriented. Well appearing and in no acute distress. Eyes: Conjunctivae are normal. PERRL. EOMI. Head: Atraumatic. Nose: No congestion/rhinnorhea. Mouth/Throat: Mucous membranes are moist.  Oropharynx non-erythematous. Cardiovascular: Normal rate, regular rhythm. Grossly normal heart sounds.  Good peripheral circulation. Respiratory: Normal  respiratory effort.  No retractions. Lungs CTAB. Gastrointestinal: Soft and nontender. No distention. Positive bowel sounds Musculoskeletal: No lower extremity tenderness nor edema.   Neurologic:  Normal speech and language. No gross focal neurologic deficits are appreciated.  Skin:  Skin is warm, dry and intact.  Psychiatric: Mood and affect are normal.   ____________________________________________   LABS (all labs ordered are listed, but only abnormal results are displayed)  Labs Reviewed  URINALYSIS, COMPLETE (UACMP) WITH MICROSCOPIC - Abnormal; Notable for the following:       Result Value   Color, Urine YELLOW (*)    APPearance HAZY (*)    Squamous Epithelial / LPF 0-5 (*)    All other components within normal limits  POCT PREGNANCY, URINE - Abnormal; Notable for the following:    Preg Test, Ur POSITIVE (*)    All other components within normal limits   ____________________________________________  EKG  none ____________________________________________  RADIOLOGY  none ____________________________________________   PROCEDURES  Procedure(s) performed: None  Procedures  Critical Care performed: No  ____________________________________________   INITIAL IMPRESSION / ASSESSMENT AND PLAN / ED COURSE  Pertinent labs & imaging results that were available during my care of the patient were reviewed by me and considered in my medical decision making (see chart for details).  This is a 21 year old female who comes into the hospital today to confirm a pregnancy test that was positive at home. The patient also has a mild headache. The entire time that I'm attempting to speak to the patient she is texting on her phone. I did give the patient 650 mg of Tylenol for her headache. I also discussed with the patient that increased stress, hormones, and not eating and drinking well could contribute to her headache. The patient's mother did ask me about resuming her Abilify and  her hydroxyzine. I did inform her that she needs to get that information from the OB/GYN. The patient reports that she only recently started taking it. According to [employer] caution is advised taking Abilify in the first trimester and caution is also advised taking hydroxyzine. Again I will defer the patient's medication management to the OB/GYN's. The patient otherwise has no other complaints. She has no abdominal pain and no vaginal bleeding. She will be discharged home to follow-up with OB.      ____________________________________________   FINAL CLINICAL IMPRESSION(S) / ED DIAGNOSES  Final diagnoses:  Acute nonintractable headache, unspecified headache type  Positive pregnancy test      NEW MEDICATIONS STARTED DURING THIS VISIT:  New Prescriptions   No medications on file     Note:  This document was prepared using Dragon voice recognition software and may include unintentional dictation errors.    Rebecka Apley, MD 03/24/17 [phone]

## 2017-03-24 NOTE — Discharge Instructions (Signed)
Please follow-up with OB/GYN. Please asked them about resuming your antidepressant medications. Please return with any vaginal bleeding any abdominal pain or any other concerns.

## 2017-03-24 NOTE — ED Notes (Signed)
Patient she feels right anterior head pain )5/10) with no change in sensory perception.  Patient's mother says she gave her a  ibuprofen around 8pm tonight and she has had no relief.

## 2017-03-24 NOTE — ED Notes (Signed)
ED Provider at bedside. 

## 2017-03-27 ENCOUNTER — Emergency Department
Admission: EM | Admit: 2017-03-27 | Discharge: 2017-03-27 | Disposition: A | Discharge status: Home / Self Care | Payer: Medicaid Other | Attending: Emergency Medicine | Admitting: Emergency Medicine

## 2017-03-27 ENCOUNTER — Inpatient Hospital Stay
Admission: AD | Admit: 2017-03-27 | Discharge: 2017-04-01 | DRG: 775 | Disposition: A | Discharge status: Home / Self Care | Payer: Medicaid Other | Source: Intra-hospital | Attending: Psychiatry | Admitting: Psychiatry

## 2017-03-27 ENCOUNTER — Encounter: Payer: Self-pay | Admitting: Emergency Medicine

## 2017-03-27 DIAGNOSIS — F209 Schizophrenia, unspecified: Secondary | ICD-10-CM | POA: Diagnosis present

## 2017-03-27 DIAGNOSIS — G4700 Insomnia, unspecified: Secondary | ICD-10-CM | POA: Diagnosis present

## 2017-03-27 DIAGNOSIS — F3164 Bipolar disorder, current episode mixed, severe, with psychotic features: Secondary | ICD-10-CM | POA: Diagnosis not present

## 2017-03-27 DIAGNOSIS — E876 Hypokalemia: Secondary | ICD-10-CM | POA: Insufficient documentation

## 2017-03-27 DIAGNOSIS — Z3A00 Weeks of gestation of pregnancy not specified: Secondary | ICD-10-CM

## 2017-03-27 DIAGNOSIS — O99344 Other mental disorders complicating childbirth: Principal | ICD-10-CM | POA: Diagnosis present

## 2017-03-27 DIAGNOSIS — Z79899 Other long term (current) drug therapy: Secondary | ICD-10-CM | POA: Diagnosis not present

## 2017-03-27 DIAGNOSIS — O99281 Endocrine, nutritional and metabolic diseases complicating pregnancy, first trimester: Secondary | ICD-10-CM | POA: Insufficient documentation

## 2017-03-27 DIAGNOSIS — F29 Unspecified psychosis not due to a substance or known physiological condition: Secondary | ICD-10-CM

## 2017-03-27 DIAGNOSIS — O99341 Other mental disorders complicating pregnancy, first trimester: Secondary | ICD-10-CM | POA: Diagnosis present

## 2017-03-27 DIAGNOSIS — Z3491 Encounter for supervision of normal pregnancy, unspecified, first trimester: Secondary | ICD-10-CM

## 2017-03-27 DIAGNOSIS — Z87891 Personal history of nicotine dependence: Secondary | ICD-10-CM

## 2017-03-27 DIAGNOSIS — F200 Paranoid schizophrenia: Secondary | ICD-10-CM | POA: Diagnosis present

## 2017-03-27 DIAGNOSIS — Z3481 Encounter for supervision of other normal pregnancy, first trimester: Secondary | ICD-10-CM

## 2017-03-27 DIAGNOSIS — F2081 Schizophreniform disorder: Secondary | ICD-10-CM

## 2017-03-27 DIAGNOSIS — F323 Major depressive disorder, single episode, severe with psychotic features: Secondary | ICD-10-CM | POA: Diagnosis present

## 2017-03-27 DIAGNOSIS — O209 Hemorrhage in early pregnancy, unspecified: Secondary | ICD-10-CM

## 2017-03-27 LAB — CBC
HEMATOCRIT: 38.2 % (ref 35.0–47.0)
HEMOGLOBIN: 12.7 g/dL (ref 12.0–16.0)
MCH: 29.5 pg (ref 26.0–34.0)
MCHC: 33.4 g/dL (ref 32.0–36.0)
MCV: 88.5 fL (ref 80.0–100.0)
Platelets: 196 10*3/uL (ref 150–440)
RBC: 4.31 MIL/uL (ref 3.80–5.20)
RDW: 13.3 % (ref 11.5–14.5)
WBC: 12.4 10*3/uL — ABNORMAL HIGH (ref 3.6–11.0)

## 2017-03-27 LAB — LIPASE, BLOOD: LIPASE: 18 U/L (ref 11–51)

## 2017-03-27 LAB — URINE DRUG SCREEN, QUALITATIVE (ARMC ONLY)
Amphetamines, Ur Screen: NOT DETECTED
BARBITURATES, UR SCREEN: NOT DETECTED
BENZODIAZEPINE, UR SCRN: NOT DETECTED
CANNABINOID 50 NG, UR ~~LOC~~: NOT DETECTED
COCAINE METABOLITE, UR ~~LOC~~: NOT DETECTED
MDMA (Ecstasy)Ur Screen: NOT DETECTED
METHADONE SCREEN, URINE: NOT DETECTED
Opiate, Ur Screen: NOT DETECTED
Phencyclidine (PCP) Ur S: NOT DETECTED
TRICYCLIC, UR SCREEN: NOT DETECTED

## 2017-03-27 LAB — URINALYSIS, COMPLETE (UACMP) WITH MICROSCOPIC
BACTERIA UA: NONE SEEN
BILIRUBIN URINE: NEGATIVE
Glucose, UA: NEGATIVE mg/dL
KETONES UR: 80 mg/dL — AB
Nitrite: NEGATIVE
PH: 6 (ref 5.0–8.0)
PROTEIN: 30 mg/dL — AB
Specific Gravity, Urine: 1.02 (ref 1.005–1.030)

## 2017-03-27 LAB — COMPREHENSIVE METABOLIC PANEL
ALT: 24 U/L (ref 14–54)
AST: 20 U/L (ref 15–41)
Albumin: 4.5 g/dL (ref 3.5–5.0)
Alkaline Phosphatase: 53 U/L (ref 38–126)
Anion gap: 9 (ref 5–15)
BUN: 5 mg/dL — ABNORMAL LOW (ref 6–20)
CHLORIDE: 103 mmol/L (ref 101–111)
CO2: 22 mmol/L (ref 22–32)
CREATININE: 0.6 mg/dL (ref 0.44–1.00)
Calcium: 9.3 mg/dL (ref 8.9–10.3)
Glucose, Bld: 104 mg/dL — ABNORMAL HIGH (ref 65–99)
POTASSIUM: 2.8 mmol/L — AB (ref 3.5–5.1)
SODIUM: 134 mmol/L — AB (ref 135–145)
Total Bilirubin: 1 mg/dL (ref 0.3–1.2)
Total Protein: 7.7 g/dL (ref 6.5–8.1)

## 2017-03-27 LAB — POCT PREGNANCY, URINE: Preg Test, Ur: POSITIVE — AB

## 2017-03-27 LAB — ETHANOL

## 2017-03-27 MED ORDER — POTASSIUM CHLORIDE CRYS ER 20 MEQ PO TBCR
40.0000 meq | EXTENDED_RELEASE_TABLET | Freq: Once | ORAL | Status: AC
  Administered 2017-03-27: 40 meq via ORAL
  Filled 2017-03-27: qty 2

## 2017-03-27 MED ORDER — PRENATAL PLUS 27-1 MG PO TABS
1.0000 | ORAL_TABLET | Freq: Every day | ORAL | Status: DC
  Administered 2017-03-30 – 2017-04-01 (×2): 1 via ORAL
  Filled 2017-03-27 (×5): qty 1

## 2017-03-27 MED ORDER — PRENATAL MULTIVITAMIN CH: 1.0000 | ORAL_TABLET | Freq: Every day | ORAL | Status: DC

## 2017-03-27 MED ORDER — ACETAMINOPHEN 325 MG PO TABS
650.0000 mg | ORAL_TABLET | Freq: Once | ORAL | Status: AC
  Administered 2017-03-27: 650 mg via ORAL
  Filled 2017-03-27: qty 2

## 2017-03-27 MED ORDER — ALUM & MAG HYDROXIDE-SIMETH 200-200-20 MG/5ML PO SUSP: 30.0000 mL | ORAL | Status: DC | PRN

## 2017-03-27 MED ORDER — HYDROXYZINE HCL 25 MG PO TABS
25.0000 mg | ORAL_TABLET | Freq: Three times a day (TID) | ORAL | Status: DC | PRN
  Administered 2017-03-27 – 2017-03-28 (×2): 25 mg via ORAL
  Filled 2017-03-27 (×2): qty 1

## 2017-03-27 MED ORDER — MAGNESIUM HYDROXIDE 400 MG/5ML PO SUSP: 30.0000 mL | Freq: Every day | ORAL | Status: DC | PRN

## 2017-03-27 MED ORDER — TRAZODONE HCL 100 MG PO TABS
100.0000 mg | ORAL_TABLET | Freq: Every evening | ORAL | Status: DC | PRN
  Administered 2017-03-27: 100 mg via ORAL
  Filled 2017-03-27: qty 1

## 2017-03-27 MED ORDER — ACETAMINOPHEN 325 MG PO TABS
650.0000 mg | ORAL_TABLET | Freq: Four times a day (QID) | ORAL | Status: DC | PRN
  Administered 2017-03-28 (×2): 650 mg via ORAL
  Filled 2017-03-27 (×2): qty 2

## 2017-03-27 MED ORDER — DIPHENHYDRAMINE HCL 25 MG PO CAPS
50.0000 mg | ORAL_CAPSULE | Freq: Once | ORAL | Status: AC
  Administered 2017-03-27: 50 mg via ORAL
  Filled 2017-03-27: qty 2

## 2017-03-27 NOTE — ED Notes (Signed)
Dr.clapaac talking with patient 
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
## 2017-03-27 NOTE — ED Notes (Signed)
Pt claiming her name isn't her name now, pt referring to herself as "Deshawn".
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
## 2017-03-27 NOTE — ED Notes (Signed)
PT IVC PENDING PSYCH CONSULT. 

## 2017-03-27 NOTE — Consult Note (Signed)
[place] [facility] Consult   Reason for Consult:  Consult for 21 year old woman who came to the emergency room with her mother although I believe commitment papers being filed. New onset bizarre behavior. Referring Physician:  Mariea Clonts Patient Identification: Tracy Poole MRN:  [id number] Principal Diagnosis: Schizophreniform disorder [facility]) Diagnosis:   Patient Active Problem List   Diagnosis Date Noted  . Schizophreniform disorder ([place]) [F20.81] 03/27/2017  . First trimester pregnancy [Z34.90] 03/27/2017    Total Time spent with patient: 1 hour  Subjective:   Tracy Poole is a 21 y.o. female patient admitted with "I want to talk to my mother".  HPI:  Patient interviewed. She was not very cooperative. Reviewed findings with emergency room physician and TTS as well. Chart reviewed. This is a 21 year old woman who was brought in by her mother after what is reported to of been bizarre behavior today. The patient tells me "I guess I was acting crazy". She can't be any more specific than that except to say that she was running away from her mother. She can't remember why she was running away. Won't discuss what if anything is on her mind. Patient is not very forthcoming and quickly shuts down during the interview saying that she only wants to speak to her mother and won't talk with me anymore. Emergency room physician reports that her interaction with the patient showed the patient to be bizarre and appearing to be delusional and possibly hallucinating. TTS spoke with the patient and her interaction was described as the patient suddenly announcing that she was a man going by a separate name. Apparently this seemed to be pretty bizarre as well. Patient has newly been diagnosed with pregnancy. There is secondhand report in the chart that suggest that she had been prescribed some antipsychotic medicine recently at [facility] although the details of her symptoms are unclear. Also secondhand information to  suggest she had discontinued it when she found out she was pregnant although that may have only been a few days ago. So far we don't know anything about any substance abuse. Labs are still coming in.  Medical history: Patient does have a positive pregnancy test. No known medical problems otherwise. She has a history of complaining of chronic headaches without any specific etiology being found.  Substance abuse history: No record in the chart of any past substance abuse concerns. Patient denies any current substance abuse. Labs are still pending.  Social history: Patient tells me she lives with her mother here locally and does not work. She can't give me any other detail about her life.  Past Psychiatric History: We do not know of any previous psychiatric hospitalization. We know that his diagnosis of "bipolar" has been mentioned although the specifics of her symptoms are not on record. It appears that she had been prescribed some Abilify although the dosage and the time course of it is unknown. No known history of suicide or violence.  Risk to Self: Is patient at risk for suicide?: No Risk to Others:   Prior Inpatient Therapy:   Prior Outpatient Therapy:    Past Medical History:  Past Medical History:  Diagnosis Date  . Depression     Past Surgical History:  Procedure Laterality Date  . EUSTACHIAN TUBE DILATION    . HERNIA REPAIR     Family History: No family history on file. Family Psychiatric  History: Unknown. Patient denies being aware of any. Social History:  History  Alcohol Use No  History  Drug Use No    Social History   Social History  . Marital status: Single    Spouse name: N/A  . Number of children: N/A  . Years of education: N/A   Social History Main Topics  . Smoking status: Former Smoker    Packs/day: 1.00    Types: Cigarettes  . Smokeless tobacco: Never Used  . Alcohol use No  . Drug use: No  . Sexual activity: Not Asked   Other Topics Concern  .  None   Social History Narrative  . None   Additional Social History:    Allergies:  No Known Allergies  Labs:  Results for orders placed or performed during the hospital encounter of 03/27/17 (from the past 48 hour(s))  Lipase, blood     Status: None   Collection Time: 03/27/17 12:29 PM  Result Value Ref Range   Lipase 18 11 - 51 U/L  Comprehensive metabolic panel     Status: Abnormal   Collection Time: 03/27/17 12:29 PM  Result Value Ref Range   Sodium 134 (L) 135 - 145 mmol/L   Potassium 2.8 (L) 3.5 - 5.1 mmol/L   Chloride 103 101 - 111 mmol/L   CO2 22 22 - 32 mmol/L   Glucose, Bld 104 (H) 65 - 99 mg/dL   BUN 5 (L) 6 - 20 mg/dL   Creatinine, Ser 0.60 0.44 - 1.00 mg/dL   Calcium 9.3 8.9 - 10.3 mg/dL   Total Protein 7.7 6.5 - 8.1 g/dL   Albumin 4.5 3.5 - 5.0 g/dL   AST 20 15 - 41 U/L   ALT 24 14 - 54 U/L   Alkaline Phosphatase 53 38 - 126 U/L   Total Bilirubin 1.0 0.3 - 1.2 mg/dL   GFR calc non Af Amer >60 >60 mL/min   GFR calc Af Amer >60 >60 mL/min    Comment: (NOTE) The eGFR has been calculated using the CKD EPI equation. This calculation has not been validated in all clinical situations. eGFR's persistently <60 mL/min signify possible Chronic Kidney Disease.    Anion gap 9 5 - 15  CBC     Status: Abnormal   Collection Time: 03/27/17 12:29 PM  Result Value Ref Range   WBC 12.4 (H) 3.6 - 11.0 K/uL   RBC 4.31 3.80 - 5.20 MIL/uL   Hemoglobin 12.7 12.0 - 16.0 g/dL   HCT 38.2 35.0 - 47.0 %   MCV 88.5 80.0 - 100.0 fL   MCH 29.5 26.0 - 34.0 pg   MCHC 33.4 32.0 - 36.0 g/dL   RDW 13.3 11.5 - 14.5 %   Platelets 196 150 - 440 K/uL  Ethanol     Status: None   Collection Time: 03/27/17 12:29 PM  Result Value Ref Range   Alcohol, Ethyl (B) <5 <5 mg/dL    Comment:        LOWEST DETECTABLE LIMIT FOR SERUM ALCOHOL IS 5 mg/dL FOR MEDICAL PURPOSES ONLY   Urinalysis, Complete w Microscopic     Status: Abnormal   Collection Time: 03/27/17  1:52 PM  Result Value Ref  Range   Color, Urine YELLOW (A) YELLOW   APPearance CLOUDY (A) CLEAR   Specific Gravity, Urine 1.020 1.005 - 1.030   pH 6.0 5.0 - 8.0   Glucose, UA NEGATIVE NEGATIVE mg/dL   Hgb urine dipstick SMALL (A) NEGATIVE   Bilirubin Urine NEGATIVE NEGATIVE   Ketones, ur 80 (A) NEGATIVE mg/dL   Protein, ur 30 (A)  NEGATIVE mg/dL   Nitrite NEGATIVE NEGATIVE   Leukocytes, UA MODERATE (A) NEGATIVE   RBC / HPF 0-5 0 - 5 RBC/hpf   WBC, UA 6-30 0 - 5 WBC/hpf   Bacteria, UA NONE SEEN NONE SEEN   Squamous Epithelial / LPF 6-30 (A) NONE SEEN   Mucous PRESENT   Pregnancy, urine POC     Status: Abnormal   Collection Time: 03/27/17  2:02 PM  Result Value Ref Range   Preg Test, Ur POSITIVE (A) NEGATIVE    Comment:        THE SENSITIVITY OF THIS METHODOLOGY IS >24 mIU/mL     Current Facility-Administered Medications  Medication Dose Route Frequency Provider Last Rate Last Dose  . potassium chloride SA (K-DUR,KLOR-CON) CR tablet 40 mEq  40 mEq Oral Once Eula Listen, MD       Current Outpatient Prescriptions  Medication Sig Dispense Refill  . ARIPiprazole (ABILIFY) 20 MG tablet Take 20 mg by mouth daily.  0  . hydrOXYzine (ATARAX/VISTARIL) 50 MG tablet Take 100 mg by mouth at bedtime.  0  . diazepam (VALIUM) 5 MG tablet Take 1 tablet (5 mg total) by mouth every 8 (eight) hours as needed for muscle spasms (do not drive or operate machinery while taking as can cause drowsiness.). 12 tablet 0  . famotidine (PEPCID) 40 MG tablet Take 0.5 tablets (20 mg total) by mouth 2 (two) times daily. (Patient not taking: Reported on 03/27/2017) 60 tablet 0  . naproxen (NAPROSYN) 500 MG tablet Take 1 tablet (500 mg total) by mouth 2 (two) times daily with a meal. (Patient not taking: Reported on 03/27/2017) 30 tablet 0  . predniSONE (DELTASONE) 20 MG tablet Take 2 tablets (40 mg total) by mouth daily. (Patient not taking: Reported on 03/27/2017) 10 tablet 0    Musculoskeletal: Strength & Muscle Tone: within normal  limits Gait & Station: normal Patient leans: N/A  Psychiatric Specialty Exam: Physical Exam  Nursing note and vitals reviewed. Constitutional: She appears well-developed and well-nourished.  HENT:  Head: Normocephalic and atraumatic.  Eyes: Conjunctivae are normal. Pupils are equal, round, and reactive to light.  Neck: Normal range of motion.  Cardiovascular: Regular rhythm and normal heart sounds.   Respiratory: Effort normal. No respiratory distress.  GI: Soft.  Musculoskeletal: Normal range of motion.  Neurological: She is alert.  Skin: Skin is warm and dry.  Psychiatric: Her affect is blunt and inappropriate. Her speech is delayed and tangential. She is slowed and withdrawn. Thought content is paranoid and delusional. Cognition and memory are impaired. She expresses impulsivity. She expresses no homicidal and no suicidal ideation. She is noncommunicative. She exhibits abnormal recent memory.    Review of Systems  Constitutional: Negative.   HENT: Negative.   Eyes: Negative.   Respiratory: Negative.   Cardiovascular: Negative.   Gastrointestinal: Negative.   Musculoskeletal: Negative.   Skin: Negative.   Neurological: Positive for headaches.  Psychiatric/Behavioral: The patient is nervous/anxious.     Blood pressure 128/70, pulse (!) 127, temperature 98.4 F (36.9 C), temperature source Oral, resp. rate 20, height _0  (1.626 m), weight 101.6 kg (224 lb), SpO2 100 %.Body mass index is 38.45 kg/m.  General Appearance: Fairly Groomed  Eye Contact:  Minimal  Speech:  Slow  Volume:  Decreased  Mood:  Anxious  Affect:  Blunt and Constricted  Thought Process:  Irrelevant  Orientation:  Negative  Thought Content:  Illogical, Rumination and Tangential  Suicidal Thoughts:  No  Homicidal Thoughts:  No  Memory:  Immediate;   Fair Recent;   Poor Remote;   Poor  Judgement:  Impaired  Insight:  Lacking  Psychomotor Activity:  Decreased  Concentration:  Concentration: Poor   Recall:  Poor  Fund of Knowledge:  Fair  Language:  Fair  Akathisia:  No  Handed:  Right  AIMS (if indicated):     Assets:  Housing Physical Health Resilience Social Support  ADL's:  Impaired  Cognition:  Impaired,  Mild  Sleep:        Treatment Plan Summary: Plan 21 year old woman who is presenting to the hospital with what seems like a fairly acute psychosis. A lot of the details of the history is still lacking. Differential diagnosis includes schizophreniform disorder, psychotic mood disorder, brief reactive psychosis, substance-induced psychosis or medically induced psychosis. Ups are still pending but she appears to be medically stable enough that it would be safe to admit her to the psychiatric unit. Patient is under IVC. Orders will be completed to admit her to the inpatient psychiatric unit with when necessary medicines for now and the primary treatment team can work on deciding about further medication that would be specific to her illness. I left a phone message for the patient's mother as well.  Disposition: Recommend psychiatric Inpatient admission when medically cleared. Supportive therapy provided about ongoing stressors.  Alethia Berthold, MD 03/27/2017 2:56 PM

## 2017-03-27 NOTE — ED Notes (Signed)
Guido Sander, RN from [facility] called for report. No questions at this time. Updating VS then admitting downstairs.

## 2017-03-27 NOTE — ED Provider Notes (Signed)
[facility] Emergency Department Provider Note  ____________________________________________  Time seen: Approximately 12:46 PM  I have reviewed the triage vital signs and the nursing notes.   HISTORY  Chief Complaint Psychiatric Evaluation    HPI Tracy Poole is a 21 y.o. female with a history of bipolar disorder, off medications, possibly pregnant, brought by police for paranoia. The patient is not forthcoming about why she is here, and has no medical complaints at this time. Police reportthat mom told them that the patient was in "crisis" last week, and was at the public health department today when she began to say that she thought her mom would hurt her, so IVC paperwork was taken out. There is some question about whether the patient is pregnant or not.   Past Medical History:  Diagnosis Date  . Depression     There are no active problems to display for this patient.   Past Surgical History:  Procedure Laterality Date  . EUSTACHIAN TUBE DILATION    . HERNIA REPAIR      Current Outpatient Rx  . Order #: [id number] Class: Historical Med  . Order #: [id number] Class: Historical Med  . Order #: [id number] Class: Print  . Order #: [id number] Class: Print  . Order #: [id number] Class: Print  . Order #: [id number] Class: Print    Allergies Patient has no known allergies.  No family history on file.  Social History Social History  Substance Use Topics  . Smoking status: Former Smoker    Packs/day: 1.00    Types: Cigarettes  . Smokeless tobacco: Never Used  . Alcohol use No    Review of Systems Constitutional: No fever/chills. Eyes: No visual changes. ENT: No sore throat. No congestion or rhinorrhea. Cardiovascular: Denies chest pain. Denies palpitations. Respiratory: Denies shortness of breath.  No cough. Gastrointestinal: No abdominal pain.  No nausea, no vomiting.  No diarrhea.  No constipation. Genitourinary: Negative for dysuria.Possible  pregnancy. Musculoskeletal: Negative for back pain. Skin: Negative for rash. Neurological: Negative for headaches. No focal numbness, tingling or weakness.  Psychiatric:Positive delusional and paranoid. Denies SI, HI, auditory or visual hallucinations.  10-point ROS otherwise negative.  ____________________________________________   PHYSICAL EXAM:  VITAL SIGNS: ED Triage Vitals  Enc Vitals Group     BP 03/27/17 1226 128/70     Pulse Rate 03/27/17 1226 (!) 127     Resp 03/27/17 1226 20     Temp 03/27/17 1226 98.4 F (36.9 C)     Temp Source 03/27/17 1226 Oral     SpO2 03/27/17 1226 100 %     Weight 03/27/17 1227 224 lb (101.6 kg)     Height 03/27/17 1227  (1.626 m)     Head Circumference --      Peak Flow --      Pain Score 03/27/17 1226 5     Pain Loc --      Pain Edu? --      Excl. in GC? --     Constitutional: Alert and oriented. Well appearing and in no acute distress. Answers questions appropriately. Eyes: Conjunctivae are normal.  EOMI. No scleral icterus. Head: Atraumatic. Nose: No congestion/rhinnorhea. Mouth/Throat: Mucous membranes are moist.  Neck: No stridor.  Supple.  No JVD. Cardiovascular: Normal rate, regular rhythm. No murmurs, rubs or gallops.  Respiratory: Normal respiratory effort.  No accessory muscle use or retractions. Lungs CTAB.  No wheezes, rales or ronchi. Gastrointestinal: Overweight. Soft, nontender and nondistended.  No guarding or rebound.  No peritoneal signs.  Musculoskeletal: No LE edema.  Neurologic:  A&Ox3.  Speech is clear.  Face and smile are symmetric.  EOMI.  Moves all extremities well. Skin:  Skin is warm, dry and intact. No rash noted. Psychiatric: The patient has bizarre and paranoid responses to my questions. Occasionally she does appear to be responsive to internal stimulus. She has no insight into why she is here. She does not have pressured speech. She denies SI, HI or  hallucinations  ____________________________________________   LABS (all labs ordered are listed, but only abnormal results are displayed)  Labs Reviewed  COMPREHENSIVE METABOLIC PANEL - Abnormal; Notable for the following:       Result Value   Sodium 134 (*)    Potassium 2.8 (*)    Glucose, Bld 104 (*)    BUN 5 (*)    All other components within normal limits  CBC - Abnormal; Notable for the following:    WBC 12.4 (*)    All other components within normal limits  URINALYSIS, COMPLETE (UACMP) WITH MICROSCOPIC - Abnormal; Notable for the following:    Color, Urine YELLOW (*)    APPearance CLOUDY (*)    Hgb urine dipstick SMALL (*)    Ketones, ur 80 (*)    Protein, ur 30 (*)    Leukocytes, UA MODERATE (*)    Squamous Epithelial / LPF 6-30 (*)    All other components within normal limits  POCT PREGNANCY, URINE - Abnormal; Notable for the following:    Preg Test, Ur POSITIVE (*)    All other components within normal limits  LIPASE, BLOOD  ETHANOL  URINE DRUG SCREEN, QUALITATIVE ([facility] ONLY)  POC URINE PREG, ED   ____________________________________________  EKG  Not indicated ____________________________________________  RADIOLOGY  No results found.  ____________________________________________   PROCEDURES  Procedure(s) performed: None  Procedures  Critical Care performed: No ____________________________________________   INITIAL IMPRESSION / ASSESSMENT AND PLAN / ED COURSE  Pertinent labs & imaging results that were available during my care of the patient were reviewed by me and considered in my medical decision making (see chart for details).  21 y.o. female with a history of bipolar disorder off her medications presenting for paranoia and delusional behavior. Overall, the patient is mildly tachycardic and we will recheck this. We'll also do a pregnancy test. I'm awaiting the results of her laboratory studies for final medical clearance. She does meet  criteria for IVC, and will require psychiatric evaluation for final disposition.  ----------------------------------------- 2:48 PM on 03/27/2017 -----------------------------------------  The patient does have a positive pregnancy test, but she is not having any vaginal discharge, vaginal bleeding or acute complaints pertaining to her pregnancy today. She is hypokalemic and this has been supplemented. She has not been having nausea vomiting or diarrhea, so no additional monitoring of her potassium as needed. The patient is medically cleared for psychiatric disposition at this time.  ____________________________________________  FINAL CLINICAL IMPRESSION(S) / ED DIAGNOSES  Final diagnoses:  Hypokalemia  Bipolar disorder, current episode mixed, severe, with psychotic features (HCC)         NEW MEDICATIONS STARTED DURING THIS VISIT:  New Prescriptions   No medications on file      Rockne Menghini, MD 03/27/17 1449
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
## 2017-03-27 NOTE — Tx Team (Signed)
Initial Treatment Plan 03/27/2017 11:44 PM Tracy Poole [id number]    PATIENT STRESSORS: Marital or family conflict Medication change or noncompliance   PATIENT STRENGTHS: Ability for insight Motivation for treatment/growth Religious Affiliation   PATIENT IDENTIFIED PROBLEMS: DEPRESSION     PSYCHOSIS                  DISCHARGE CRITERIA:  Improved stabilization in mood, thinking, and/or behavior Motivation to continue treatment in a less acute level of care  PRELIMINARY DISCHARGE PLAN: Participate in family therapy  PATIENT/FAMILY INVOLVEMENT: This treatment plan has been presented to and reviewed with the patient, Tracy Poole,The patient and family have been given the opportunity to ask questions and make suggestions.  Trula Ore, RN 03/27/2017, 11:44 PM

## 2017-03-27 NOTE — ED Notes (Signed)
PT IVC/SEEN BY DR CLAPACS/RECOMMENDS PSYCHIATRIC INPATIENT ADMISSION WHEN MEDICALLY CLEARED. 

## 2017-03-27 NOTE — BHH Counselor (Signed)
Patient is to be admitted to [facility] by Dr. Toni Amend.  Attending Physician will be Dr.Pucilowska.   Patient has been assigned to room 315, by [facility] Charge Nurse Gwen.   Intake Paper Work has been signed and placed on patient chart.  ER staff is aware of the admission.

## 2017-03-27 NOTE — BH Assessment (Addendum)
TTS interview complete. Dr. Clapacs recommends inpatient admission pending medical clearance. Unit Charge RN (Gwen) provided with pt MRN. Pt chart pending review for possible placement with [facility].   

## 2017-03-28 ENCOUNTER — Encounter: Payer: Self-pay | Admitting: Psychiatry

## 2017-03-28 DIAGNOSIS — F29 Unspecified psychosis not due to a substance or known physiological condition: Secondary | ICD-10-CM

## 2017-03-28 DIAGNOSIS — F323 Major depressive disorder, single episode, severe with psychotic features: Secondary | ICD-10-CM | POA: Diagnosis present

## 2017-03-28 LAB — LIPID PANEL
Cholesterol: 120 mg/dL (ref 0–200)
HDL: 38 mg/dL — AB (ref >40)
LDL CALC: 77 mg/dL (ref 0–99)
Total CHOL/HDL Ratio: 3.2 RATIO
Triglycerides: 24 mg/dL (ref <150)
VLDL: 5 mg/dL (ref 0–40)

## 2017-03-28 LAB — TSH: TSH: 1.219 u[IU]/mL (ref 0.350–4.500)

## 2017-03-28 MED ORDER — OLANZAPINE 10 MG PO TABS
10.0000 mg | ORAL_TABLET | Freq: Once | ORAL | Status: AC
  Administered 2017-03-28: 10 mg via ORAL
  Filled 2017-03-28: qty 1

## 2017-03-28 MED ORDER — OLANZAPINE 5 MG PO TBDP
10.0000 mg | ORAL_TABLET | Freq: Once | ORAL | Status: AC
  Administered 2017-03-28: 10 mg via ORAL
  Filled 2017-03-28: qty 2

## 2017-03-28 MED ORDER — FOLIC ACID 1 MG PO TABS
2.0000 mg | ORAL_TABLET | Freq: Every day | ORAL | Status: DC
  Administered 2017-03-29 – 2017-04-01 (×3): 2 mg via ORAL
  Filled 2017-03-28 (×4): qty 2

## 2017-03-28 MED ORDER — ZOLPIDEM TARTRATE 5 MG PO TABS
5.0000 mg | ORAL_TABLET | Freq: Every evening | ORAL | Status: DC | PRN
  Administered 2017-03-28 – 2017-03-29 (×2): 5 mg via ORAL
  Filled 2017-03-28 (×2): qty 1

## 2017-03-28 MED ORDER — OLANZAPINE 5 MG PO TBDP
10.0000 mg | ORAL_TABLET | Freq: Two times a day (BID) | ORAL | Status: DC
  Administered 2017-03-28 – 2017-03-29 (×2): 10 mg via ORAL
  Filled 2017-03-28 (×2): qty 2

## 2017-03-28 NOTE — Progress Notes (Signed)
Pt to this nurse multiple times this morning requesting to see the doctor and to be discharged "to go home to my mama." Pt does endorse feelings of paranoia, thoughts of "the devil or someone trying to kill me." Pt claims she has been having these thoughts/feelings for a week now, reports "I know they're not real now." Pt appears flat/depressed, she is calm and cooperative. Reports stopped taking medications a few weeks ago because she found out she is pregnant. Reports past miscarriages. Support and encouragement provided. Safety maintained with every 15 minute checks. Will continue to monitor.
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
## 2017-03-28 NOTE — Progress Notes (Signed)
Pt to medication room, refusing medications as ordered at this time to include prenatal vitamin and folic acid. Medication use explained to pt, but she continues to voice feeling/thinking paranoid "like something bad is going to happen to me. I just want to go home." Pt reassured that safety will be maintained. Will continue to monitor.

## 2017-03-28 NOTE — Progress Notes (Signed)
Pt continues to voice paranoia. Requests room change, change denied due to patient already being moved once to a new room due to paranoia. Compliant with medications as ordered with encouragement. Per self inventory, pt reports fair sleep last night without the help of sleep medications (it is reported that pt slept none last night even though medications were given as ordered). Reports good appetite, normal energy, good concentration. Rates depression 0/10, hopelessness 0/10, anxiety 0/10 (low 0-10 high). Goal today is "going home so I can e with my mama" by "be good and do what I have to do." Support and encouragement provided. Medications administered as ordered with education. Safety maintained with every 15 minute checks. Will continue to monitor.

## 2017-03-28 NOTE — Progress Notes (Signed)
Recreation Therapy Notes  Date: 05.03.18 Time: 9:30 am Location: Craft Room  Group Topic: Leisure Education  Goal Area(s) Addresses:  Patient will identify things they are grateful for. Patient will identify how being grateful can influence decision making.  Behavioral Response: Intermittently Attentive, Intermittently Interactive  Intervention: Grateful Wheel  Activity: Patients were given an I Am Grateful For worksheet and were instructed to write things they were grateful for under each category.  Education: LRT educated patients on leisure.  Education Outcome: In group clarification offered  Clinical Observations/Feedback: Patient wrote things she is grateful for. Patient contributed to group discussion by stating things she is grateful for. Patient would lay her head on the table throughout group.  Jacquelynn CreeGreene,Cera Rorke M, LRT/CTRS 03/28/2017 10:21 AM

## 2017-03-28 NOTE — BHH Group Notes (Signed)
Goals Group Date/Time: 03/28/2017 9:00 AM Type of Therapy and Topic: Group Therapy: Goals Group: SMART Goals   Participation Level: Moderate  Description of Group:    The purpose of a daily goals group is to assist and guide patients in setting recovery/wellness-related goals. The objective is to set goals as they relate to the crisis in which they were admitted. Patients will be using SMART goal modalities to set measurable goals. Characteristics of realistic goals will be discussed and patients will be assisted in setting and processing how one will reach their goal. Facilitator will also assist patients in applying interventions and coping skills learned in psycho-education groups to the SMART goal and process how one will achieve defined goal.   Therapeutic Goals:   -Patients will develop and document one goal related to or their crisis in which brought them into treatment.  -Patients will be guided by LCSW using SMART goal setting modality in how to set a measurable, attainable, realistic and time sensitive goal.  -Patients will process barriers in reaching goal.  -Patients will process interventions in how to overcome and successful in reaching goal.   Patient's Goal: Pt goal was to speak with her MD about her medicine and to participate in the daily groups.   Therapeutic Modalities:  Motivational Interviewing  Research officer, political partyCognitive Behavioral Therapy  Crisis Intervention Model  SMART goals setting   Daleen SquibbGreg Naylea [place], [place]CSW
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
## 2017-03-28 NOTE — Progress Notes (Signed)
Recreation Therapy Notes  INPATIENT RECREATION THERAPY ASSESSMENT  Patient Details Name: Tracy Poole MRN: [id number] DOB: 02/24/1996 Today's Date: 03/28/2017  Patient Stressors: Other (Comment) (Being in the hospital - wants to go home)  Coping Skills:   Avoidance, Exercise, Art/Dance, Talking, Music, Sports  Personal Challenges: Anger  Leisure Interests (2+):  Sports - Swimming  Awareness of Community Resources:  No  Community Resources:     Current Use:    If no, Barriers?:    Patient Strengths:  Personality, smart  Patient Identified Areas of Improvement:  No  Current Recreation Participation:  Nothing  Patient Goal for Hospitalization:  To get home  [place]  County [place] Residence:  [place]   Current SI (including self-harm):  No  Current HI:  No (Patient stated she had thoughts of others wanting to hurt her)  Consent to Intern Participation: N/A   Jacquelynn CreeGreene,Huong Luthi M, LRT/CTRS 03/28/2017, 4:31 PM
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
## 2017-03-28 NOTE — [facility] Suicide Risk Assessment (Signed)
[facility] INPATIENT:  Family/Significant Other Suicide Prevention Education  Suicide Prevention Education:  Education Completed;Vicky Corbett(mother [phone]), has been identified by the patient as the family member/significant other with whom the patient will be residing, and identified as the person(s) who will aid the patient in the event of a mental health crisis (suicidal ideations/suicide attempt).  With written consent from the patient, the family member/significant other has been provided the following suicide prevention education, prior to the and/or following the discharge of the patient.  The suicide prevention education provided includes the following:  Suicide risk factors  Suicide prevention and interventions  National Suicide Hotline telephone number  [facility] assessment telephone number  [facility] Emergency Assistance 911  [facility] and/or Residential Mobile Crisis Unit telephone number  Request made of family/significant other to:  Remove weapons (e.g., guns, rifles, knives), all items previously/currently identified as safety concern.    Remove drugs/medications (over-the-counter, prescriptions, illicit drugs), all items previously/currently identified as a safety concern.  The family member/significant other verbalizes understanding of the suicide prevention education information provided.  The family member/significant other agrees to remove the items of safety concern listed above.  Lavone Weisel G. Garnette CzechSampson MSW, LCSWA 03/28/2017, 2:58 PM
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
## 2017-03-28 NOTE — [facility] Counselor (Signed)
Adult Comprehensive Assessment  Patient ID: Tracy Poole, female   DOB: 06-09-1996, 21 y.o.   MRN: [id number]  Information Source: Information source: Patient  Current Stressors:  Educational / Learning stressors: n/a Employment / Job issues: n/a Family Relationships: n/a Surveyor, quantity / Lack of resources (include bankruptcy): n/a Housing / Lack of housing: n/a Physical health (include injuries & life threatening diseases): Patient thinks she is 2 months pregnant  Social relationships: n/a Substance abuse: Patient denies  Bereavement / Loss: n/a  Living/Environment/Situation:  Living Arrangements: Parent Living conditions (as described by patient or guardian): Patient states it is going okay.  How long has patient lived in current situation?: 3 weeks What is atmosphere in current home: Comfortable, Paramedic, Supportive  Family History:  Marital status: Single Are you sexually active?: Yes What is your sexual orientation?: heterosexual Has your sexual activity been affected by drugs, alcohol, medication, or emotional stress?: n/a Does patient have children?: No (Patient is currently pregnant)  Childhood History:  By whom was/is the patient raised?: Mother, Grandparents Additional childhood history information: n/a Description of patient's relationship with caregiver when they were a child: Patient states she had a good childhood Patient's description of current relationship with people who raised him/her: Patient states she gets along with her mother and grandparents.  How were you disciplined when you got in trouble as a child/adolescent?: n/a Does patient have siblings?: Yes Number of Siblings: 2 Description of patient's current relationship with siblings: Patient states she has siblings on her father side.  Did patient suffer any verbal/emotional/physical/sexual abuse as a child?: No Did patient suffer from severe childhood neglect?: No Has patient ever been sexually  abused/assaulted/raped as an adolescent or adult?: No Was the patient ever a victim of a crime or a disaster?: No Witnessed domestic violence?: No Has patient been effected by domestic violence as an adult?: No  Education:  Highest grade of school patient has completed: 11th grade Currently a student?: No Learning disability?: No  Employment/Work Situation:   Employment situation: Unemployed Patient's job has been impacted by current illness: No What is the longest time patient has a held a job?: 6 months Where was the patient employed at that time?: [employer] Has patient ever been in the [employer]?: No Has patient ever served in combat?: No Did You Receive Any Psychiatric Treatment/Services While in Equities trader?: No Are There Guns or Education officer, community in Your Home?: No Are These Comptroller?:  (n/a)  Financial Resources:   Surveyor, quantity resources: Support from parents / caregiver, Medicaid Does patient have a Lawyer or guardian?: No  Alcohol/Substance Abuse:   What has been your use of drugs/alcohol within the last 12 months?: Patient denies If attempted suicide, did drugs/alcohol play a role in this?: No Alcohol/Substance Abuse Treatment Hx: Denies past history Has alcohol/substance abuse ever caused legal problems?: No  Social Support System:   Conservation officer, nature Support System: Good Describe Community Support System: Patient has support from mother and grandmother Type of faith/religion: n/a How does patient's faith help to cope with current illness?: n/a  Leisure/Recreation:   Leisure and Hobbies: listening to music, writing, dancing, video games  Strengths/Needs:   What things does the patient do well?: cooking, doing hair,communication In what areas does patient struggle / problems for patient: "I just need help getting back to how I used to be"  Discharge Plan:   Does patient have access to transportation?: Yes Will patient be returning to same  living situation after discharge?: Yes Currently  receiving community mental health services: No If no, would patient like referral for services when discharged?: Yes (What county?) [facility]([place]) Does patient have financial barriers related to discharge medications?: No  Summary/Recommendations:   Patient is a 21 year old female admitted involuntarily with a diagnosis of 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 Information was obtained from psychosocial assessment completed with patient and chart review conducted by this evaluator. Patient presented to the hospital with paranoia and auditory hallucinations. Patient reports primary triggers for admission were recently finding out she was pregnant and discontinuing all of her medications. Patient has support from her mother. Patient will benefit from crisis stabilization, medication evaluation, group therapy and psycho education in addition to case management for discharge. At discharge, it is recommended that patient remain compliant with established discharge plan and continued treatment.   Nishant Schrecengost G. Garnette CzechSampson MSW, [facility]SWA 03/28/2017 2:39 PM

## 2017-03-28 NOTE — [facility] Group Notes (Signed)
[facility] LCSW Group Therapy  03/28/2017 3:16 PM  Type of Therapy:  Group Therapy  Participation Level:  Minimal  Participation Quality:  Attentive  Affect:  Anxious  Cognitive:  Delusional  Insight:  Poor  Engagement in Therapy:  Limited  Modes of Intervention:  Activity, Discussion, Education, Problem-solving, Dance movement psychotherapisteality Testing, Socialization and Support  Summary of Progress/Problems: Balance in life: Patients will discuss the concept of balance and how it looks and feels to be unbalanced. Pt will identify areas in their life that is unbalanced and ways to become more balanced. They discussed what aspects in their lives has influenced their self care. Patients also discussed self care in the areas of self regulation/control, hygiene/appearance, sleep/relaxation, healthy leisure, healthy eating habits, exercise, inner peace/spirituality, self improvement, sobriety, and health management. They were challenged to identify changes that are needed in order to improve self care.  Tracy Poole G. Garnette CzechSampson MSW, LCSWA 03/28/2017, 3:16 PM

## 2017-03-28 NOTE — Progress Notes (Signed)
Pt requesting name of medications given to her last night as ordered. Provided names of medications given, pt voices paranoia stating, "I think those are someone else's medications." Support and encouragement provided. Pt reassured again that she is safe on unit, safety maintained with every 15 minute checks. Will continue to monitor.

## 2017-03-28 NOTE — Plan of Care (Signed)
Problem: Education: Goal: Will be free of psychotic symptoms Outcome: Not Progressing Pt extremely paranoid, voices thoughts of "the devil planting roots, someone is going to kill me at 6 o'clock."

## 2017-03-29 ENCOUNTER — Inpatient Hospital Stay: Payer: Medicaid Other

## 2017-03-29 LAB — HEMOGLOBIN A1C
Hgb A1c MFr Bld: 5.1 % (ref 4.8–5.6)
Mean Plasma Glucose: 100 mg/dL

## 2017-03-29 LAB — PROLACTIN: PROLACTIN: 53.4 ng/mL — AB (ref 4.8–23.3)

## 2017-03-29 LAB — TYPE AND SCREEN
ABO/RH(D): O NEG
Antibody Screen: NEGATIVE

## 2017-03-29 LAB — HCG, QUANTITATIVE, PREGNANCY: hCG, Beta Chain, Quant, S: 36566 m[IU]/mL — ABNORMAL HIGH (ref <5)

## 2017-03-29 MED ORDER — OLANZAPINE 5 MG PO TBDP
20.0000 mg | ORAL_TABLET | Freq: Every day | ORAL | Status: AC
  Administered 2017-03-30: 20 mg via ORAL
  Filled 2017-03-29: qty 4

## 2017-03-29 MED ORDER — HALOPERIDOL 5 MG PO TABS: 5.0000 mg | ORAL_TABLET | Freq: Four times a day (QID) | ORAL | Status: DC | PRN

## 2017-03-29 MED ORDER — OLANZAPINE 5 MG PO TBDP
30.0000 mg | ORAL_TABLET | Freq: Every day | ORAL | Status: DC
  Administered 2017-03-31: 30 mg via ORAL
  Filled 2017-03-29 (×3): qty 6

## 2017-03-29 MED ORDER — RHO D IMMUNE GLOBULIN 1500 UNIT/2ML IJ SOSY
300.0000 ug | PREFILLED_SYRINGE | Freq: Once | INTRAMUSCULAR | Status: AC
  Administered 2017-03-30: 300 ug via INTRAMUSCULAR
  Filled 2017-03-29: qty 2

## 2017-03-29 MED ORDER — OLANZAPINE 5 MG PO TBDP: 30.0000 mg | ORAL_TABLET | Freq: Every day | ORAL | Status: DC

## 2017-03-29 NOTE — Plan of Care (Signed)
Problem: Education: Goal: Will be free of psychotic symptoms Outcome: Not Progressing Patient is still showing symptoms of psychosis; paranoia and delusions.

## 2017-03-29 NOTE — Consult Note (Signed)
Ultrasound findings reviewed.  In patient's current state of acute psychosis transabdominal images were able to be obtained but she refused transvaginal limiting quality of images.  Ultrasound does demonstrate an early intrauterine pregnancy ruling out ectopic.  Fetal hear tones were not yet able to be visualized by shape, location, and internal structures (yolk sac) were appropriately visualized.  Recommendation is for repeat ultrasound in 11 days or more to determine viability.  Rhogam ordered for Rh negative status.    "Society of Radiologyst in Ultrasound Guidelines for Transvaginal Ultrasonographic Diagnosis of Early Pregnancy Loss" and adopted in ACOG Practice Bulletin Number 150, May 2015 (reaffirmed 2017) "Early Pregnancy Loss"  - CRL of 7mm or greater and absence of fetal heartbeat  - Mean sac diameter of 25mm or greater and no embryo  - Absence of embryo with a discernable heartbeat 2 week after initial ultrasound showing a gestational sac without a yolk sac  - Absence of embryo with a discernable heartbeat 11 days after initial ultrasound showing a gestational sac with  yolk sac present

## 2017-03-29 NOTE — Progress Notes (Signed)
D: Pt denies SI/HI/AVH but endorsed hearing voices, but denies command hallucination. Pt is pleasant and cooperative, affect is flat and sad, patient is paranoid and suspicious of staff. Patient repeatedly asked staff multiple questions; if she is going to be okay? If someone put a root on her head. Pt was redirected multiple times, but patient kept asking repeatedly. Patient is very anxious and restless  and she is not interacting with peers and staff appropriately.  A: Pt was offered support and encouragement. Pt was given scheduled medications. Pt was encouraged to attend groups. Q 15 minute checks were done for safety.  R:Pt attends group but does not interacts well with peers and staff. Pt is very suspicious taking medication she was often cued and mouth checks were done because patient was noted to be cheeking her medication. Pt is not receptive to treatment, safety maintained on unit, will continue to closely monitor.

## 2017-03-29 NOTE — [facility] Group Notes (Signed)
[facility] LCSW Group Therapy  03/29/2017 3:45 PM  Type of Therapy:  Group Therapy  Participation Level:  Minimal  Participation Quality:  Drowsy and Inattentive  Affect:  Lethargic  Cognitive:  Disorganized and Delusional  Insight:  Poor  Engagement in Therapy:  Limited  Modes of Intervention:  Activity, Discussion, Education, Problem-solving, Dance movement psychotherapisteality Testing, Socialization and Support  Summary of Progress/Problems: Feelings around Relapse. Group members discussed the meaning of relapse and shared personal stories of relapse, how it affected them and others, and how they perceived themselves during this time. Group members were encouraged to identify triggers, warning signs and coping skills used when facing the possibility of relapse. Social supports were discussed and explored in detail. Patients also discussed facing disappointment and how that can trigger someone to relapse.  Diondra Pines G. Garnette CzechSampson MSW, LCSWA 03/29/2017, 3:46 PM

## 2017-03-29 NOTE — [facility] Group Notes (Signed)
[facility] Group Notes:  (Nursing/MHT/Case Management/Adjunct)  Date:  03/29/2017  Time:  11:31 PM  Type of Therapy:  Group Therapy  Participation Level:  Active  Participation Quality:  Appropriate  Affect:  Appropriate  Cognitive:  Appropriate  Insight:  Appropriate  Engagement in Group:  Engaged  Modes of Intervention:  Discussion  Summary of Progress/Problems:  Tracy Poole 03/29/2017, 11:31 PM

## 2017-03-29 NOTE — Progress Notes (Signed)
Pt delusional and suspicious regarding medications. Presented to RN station and stated that " I think a snake is trying to get me, there is something wrong with my bed"  Pt given PRN ambien for sleep. Effectiveness TBD. Visited with mom and grandmother. Mom requesting that MD call her to update her on Pts progress.  Medication compliant with encouragement. Denies pain and voices no additional concerns at this time. Encouragement and support provided. Safety maintained. Will continue to monitor.

## 2017-03-29 NOTE — Tx Team (Addendum)
Interdisciplinary Treatment and Diagnostic Plan Update  03/29/2017 Time of Session: 10:30 AM  Tracy Poole MRN: [id number]  Principal Diagnosis: Schizophrenia spectrum disorder with psychotic disorder type not yet determined  Secondary Diagnoses: Principal Problem:   Schizophrenia spectrum disorder with psychotic disorder type not yet determined Active Problems:   First trimester pregnancy   Major depressive disorder, single episode, severe with psychotic features (HCC)   Current Medications:  Current Facility-Administered Medications  Medication Dose Route Frequency Provider Last Rate Last Dose  . acetaminophen (TYLENOL) tablet 650 mg  650 mg Oral Q6H PRN Audery Amel, MD   650 mg at 03/28/17 2103  . alum & mag hydroxide-simeth (MAALOX/MYLANTA) 200-200-20 MG/5ML suspension 30 mL  30 mL Oral Q4H PRN Audery Amel, MD      . folic acid (FOLVITE) tablet 2 mg  2 mg Oral Daily Jolanta B Pucilowska, MD   2 mg at 03/29/17 0857  . hydrOXYzine (ATARAX/VISTARIL) tablet 25 mg  25 mg Oral TID PRN Audery Amel, MD   25 mg at 03/28/17 2057  . magnesium hydroxide (MILK OF MAGNESIA) suspension 30 mL  30 mL Oral Daily PRN Audery Amel, MD      . OLANZapine zydis (ZYPREXA) disintegrating tablet 10 mg  10 mg Oral BID Shari Prows, MD   10 mg at 03/29/17 0857  . prenatal vitamin w/FE, FA (PRENATAL 1 + 1) 27-1 MG tablet 1 tablet  1 tablet Oral Q1200 Audery Amel, MD      . zolpidem (AMBIEN) tablet 5 mg  5 mg Oral QHS PRN Shari Prows, MD   5 mg at 03/28/17 2057   PTA Medications: Prescriptions Prior to Admission  Medication Sig Dispense Refill Last Dose  . ARIPiprazole (ABILIFY) 20 MG tablet Take 20 mg by mouth daily.  0   . hydrOXYzine (ATARAX/VISTARIL) 50 MG tablet Take 100 mg by mouth at bedtime.  0   . diazepam (VALIUM) 5 MG tablet Take 1 tablet (5 mg total) by mouth every 8 (eight) hours as needed for muscle spasms (do not drive or operate machinery while taking as can cause  drowsiness.). (Patient not taking: Reported on 03/27/2017) 12 tablet 0 Not Taking at Unknown time  . famotidine (PEPCID) 40 MG tablet Take 0.5 tablets (20 mg total) by mouth 2 (two) times daily. (Patient not taking: Reported on 03/27/2017) 60 tablet 0 Not Taking at Unknown time  . naproxen (NAPROSYN) 500 MG tablet Take 1 tablet (500 mg total) by mouth 2 (two) times daily with a meal. (Patient not taking: Reported on 03/27/2017) 30 tablet 0 Not Taking at Unknown time  . predniSONE (DELTASONE) 20 MG tablet Take 2 tablets (40 mg total) by mouth daily. (Patient not taking: Reported on 03/27/2017) 10 tablet 0 Not Taking at Unknown time    Patient Stressors: Marital or family conflict Medication change or noncompliance  Patient Strengths: Ability for insight Motivation for treatment/growth Religious Affiliation  Treatment Modalities: Medication Management, Group therapy, Case management,  1 to 1 session with clinician, Psychoeducation, Recreational therapy.   Physician Treatment Plan for Primary Diagnosis: Schizophrenia spectrum disorder with psychotic disorder type not yet determined Long Term Goal(s): Improvement in symptoms so as ready for discharge NA   Short Term Goals: Ability to identify changes in lifestyle to reduce recurrence of condition will improve Ability to verbalize feelings will improve Ability to disclose and discuss suicidal ideas Ability to demonstrate self-control will improve Ability to identify and develop effective coping  behaviors will improve Compliance with prescribed medications will improve Ability to identify triggers associated with substance abuse/mental health issues will improve NA  Medication Management: Evaluate patient's response, side effects, and tolerance of medication regimen.  Therapeutic Interventions: 1 to 1 sessions, Unit Group sessions and Medication administration.  Evaluation of Outcomes: Progressing  Physician Treatment Plan for Secondary  Diagnosis: Principal Problem:   Schizophrenia spectrum disorder with psychotic disorder type not yet determined Active Problems:   First trimester pregnancy   Major depressive disorder, single episode, severe with psychotic features (HCC)  Long Term Goal(s): Improvement in symptoms so as ready for discharge NA   Short Term Goals: Ability to identify changes in lifestyle to reduce recurrence of condition will improve Ability to verbalize feelings will improve Ability to disclose and discuss suicidal ideas Ability to demonstrate self-control will improve Ability to identify and develop effective coping behaviors will improve Compliance with prescribed medications will improve Ability to identify triggers associated with substance abuse/mental health issues will improve NA     Medication Management: Evaluate patient's response, side effects, and tolerance of medication regimen.  Therapeutic Interventions: 1 to 1 sessions, Unit Group sessions and Medication administration.  Evaluation of Outcomes: Progressing   RN Treatment Plan for Primary Diagnosis: Schizophrenia spectrum disorder with psychotic disorder type not yet determined Long Term Goal(s): Knowledge of disease and therapeutic regimen to maintain health will improve  Short Term Goals: Ability to demonstrate self-control, Ability to identify and develop effective coping behaviors will improve and Compliance with prescribed medications will improve  Medication Management: RN will administer medications as ordered by provider, will assess and evaluate patient's response and provide education to patient for prescribed medication. RN will report any adverse and/or side effects to prescribing provider.  Therapeutic Interventions: 1 on 1 counseling sessions, Psychoeducation, Medication administration, Evaluate responses to treatment, Monitor vital signs and CBGs as ordered, Perform/monitor CIWA, COWS, AIMS and Fall Risk screenings as  ordered, Perform wound care treatments as ordered.  Evaluation of Outcomes: Progressing   LCSW Treatment Plan for Primary Diagnosis: Schizophrenia spectrum disorder with psychotic disorder type not yet determined Long Term Goal(s): Safe transition to appropriate next level of care at discharge, Engage patient in therapeutic group addressing interpersonal concerns.  Short Term Goals: Engage patient in aftercare planning with referrals and resources, Increase emotional regulation and Increase skills for wellness and recovery  Therapeutic Interventions: Assess for all discharge needs, 1 to 1 time with Social worker, Explore available resources and support systems, Assess for adequacy in community support network, Educate family and significant other(s) on suicide prevention, Complete Psychosocial Assessment, Interpersonal group therapy.  Evaluation of Outcomes: Progressing    Recreational Therapy Treatment Plan for Primary Diagnosis: Schizophrenia spectrum disorder with psychotic disorder type not yet determined Long Term Goal(s): Patient will participate in recreation therapy treatment in at least 2 group sessions without prompting from LRT  Short Term Goals: Increase anger management skills  Treatment Modalities: Group Therapy and Individual Treatment Sessions  Therapeutic Interventions: Psychoeducation  Evaluation of Outcomes: Progressing   Progress in Treatment: Attending groups: Yes. Participating in groups: Yes. Taking medication as prescribed: Yes. Toleration medication: Yes. Family/Significant other contact made: Yes, individual(s) contacted:  CSW spoke with patient's mother. Patient understands diagnosis: Yes. Discussing patient identified problems/goals with staff: Yes. Medical problems stabilized or resolved: Yes. Denies suicidal/homicidal ideation: Yes. Issues/concerns per patient self-inventory: No.  New problem(s) identified: No, Describe:  None identified.  New  Short Term/Long Term Goal(s): Patient's goal is to discharge home  and be with family.  Discharge Plan or Barriers: CSW assessing proper aftercare plans.  Reason for Continuation of Hospitalization: Delusions  Depression  Estimated Length of Stay: 7 days   Attendees: Patient: Tracy Poole  03/29/2017 11:48 AM  Physician: Dr. Kristine Linea, MD  03/29/2017 11:48 AM  Nursing:  03/29/2017 11:48 AM  RN Care Manager: 03/29/2017 11:48 AM  Social Worker: Hampton Abbot, MSW, LCSW-A 03/29/2017 11:48 AM  Recreational Therapist: Princella Ion, LRT/CTRS  03/29/2017 11:48 AM  Other:  03/29/2017 11:48 AM  Other:  03/29/2017 11:48 AM  Other: 03/29/2017 11:48 AM    Scribe for Treatment Team: Lynden Oxford, LCSWA 03/29/2017 11:48 AM

## 2017-03-29 NOTE — Progress Notes (Signed)
Patient is delusional.  Asks "Are ya'll trying to kill me."  Social worker reported that she told another patient that she(the social worker was going to kill her"  Patient to delusional at this time to re-orient to reality.  Refused ultrasound and refused to take prenatal vitamin.  Paces unit and stares.  Support offered.  Safety checks maintained.

## 2017-03-29 NOTE — Plan of Care (Signed)
Problem: Coping: Goal: Ability to verbalize feelings will improve Outcome: Not Progressing Patient forwards little and reluctant to talk.
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
## 2017-03-29 NOTE — Progress Notes (Signed)
Recreation Therapy Notes  Date: 05.04.18 Time: 9:30 am Location: Craft Room  Group Topic: Coping Skills  Goal Area(s) Addresses:  Patient will participate in healthy coping skill. Patient will verbalize benefit of using art as a coping skill.  Behavioral Response: Attentive  Intervention: Coloring  Activity: Patients were given coloring sheets to color and were instructed to think about the emotions they were feeling as well as what their minds were focused on.  Education: LRT educated patients on healthy coping skills.  Education Outcome: In group clarification offered  Clinical Observations/Feedback: At the beginning of group, patient thought she had blood in her head. Patient went to nurse's station. Patient returned to group. Patient colored coloring sheet. Patient asked about being discharged. Peer said she would not be discharged. LRT redirect patient and informed her she would talk to her treatment team about her treatment plan.  Jacquelynn CreeGreene,Vanecia Limpert M, LRT/CTRS 03/29/2017 10:20 AM

## 2017-03-29 NOTE — Consult Note (Signed)
Spoke with attending, patient is a 21 year old admitted with acute psychotic episode, positive pregnancy test, and some vaginal bleeding. She is reportedly not a reliable historian at present, but does not appear to have established prenatal care.  Urine pregnancy test was negative on 02/26/17 positive on 03/23/17 narrowing down her likely gestational age, HCG value is 35,000.  Will obtain TVUS to document viability, rule out ectopic pregnancy.  Type and screen ordered, should patient be Rh negative she will require rhogam to prevent Rh isoimmunization.  Will follow up with patient and psychiatry staff after results of ultrasound.

## 2017-03-30 NOTE — Progress Notes (Signed)
Pt denies SI/HI/AVH. Visible in dayroom with appropriate behaviors among staff and peers. Mom and grandma visited with Pt today and stated that they "can see some improvement" in Pt. Pt was medication compliant but was suspicious of dosage. Presents less frequently to RN station. Encouragement and support provided, safety maintained. Will continue to monitor.

## 2017-03-30 NOTE — Plan of Care (Signed)
Problem: Education: Goal: Will be free of psychotic symptoms Outcome: Not Progressing Pt delusional. States a snake is trying to get her.

## 2017-03-30 NOTE — [facility] Group Notes (Signed)
[facility] LCSW Group Therapy  03/30/2017 2:21 PM  Type of Therapy:  Group Therapy  Participation Level:  Active  Participation Quality:  Attentive  Affect:  Appropriate  Cognitive:  Alert  Insight:  Limited  Engagement in Therapy:  Limited  Modes of Intervention:  Activity, Discussion, Education, Problem-solving, Reality Testing, Socialization and Support  Summary of Progress/Problems: Communications: Patients identify how individuals communicate with one another appropriately and inappropriately. Patients will be guided to discuss their thoughts, feelings, and behaviors related to barriers when communicating. The group will process together ways to execute positive and appropriate communications. Patient stated she would like to improve communication with her mother and work on her active listening skills. CSW provided support to patient and discussed the different communication styles.   Shenika Quint G. Garnette CzechSampson MSW, LCSWA 03/30/2017, 2:22 PM

## 2017-03-30 NOTE — Progress Notes (Signed)
Pt less paranoid today, did make some paranoid statements first thing this morning about "roots." Pt at first would not take folic acid as ordered, questions rhogam injection as ordered. This nurse provided written information about use of prenatal vitamins, folic acid and rhogam, then pt was compliant. Pt allowed staff to braid her hair. Appropriately interacts with staff/peers. Completes ADLs without hesitation. Attend group. Reports better sleep. MHT reports pt asks about discharge numerous times and states, "I feel like something bad is going to happen on Monday, like I'm going to die or something." Pt appears less anxious. Support and encouragement provided. Medications administered as ordered with education. Safety maintained with every 15 minute checks. Will continue to monitor.

## 2017-03-30 NOTE — [facility] Group Notes (Signed)
[facility] Group Notes:  (Nursing/MHT/Case Management/Adjunct)  Date:  03/30/2017  Time:  11:39 PM  Type of Therapy:  Group Therapy  Participation Level:  Active  Participation Quality:  Appropriate  Affect:  Appropriate  Cognitive:  Alert  Insight:  Good  Engagement in Group:  Engaged  Modes of Intervention:  Support  Summary of Progress/Problems:  Mayra NeerJackie L Zubair Lofton 03/30/2017, 11:39 PM
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
## 2017-03-30 NOTE — Plan of Care (Signed)
Problem: Coping: Goal: Ability to cope will improve Outcome: Progressing Pt less paranoid today, but continues to voice feeling like something bad is going to happen and that someone is placing "roots" on her. Interactive with staff/peers, appropriate with interactions, completes ADLs.

## 2017-03-31 LAB — RHOGAM INJECTION: Unit division: 0

## 2017-03-31 NOTE — Progress Notes (Signed)
Pt denies SI/HI/AVH. Medication compliant with much encouragement. She stated "my mom said that I don't need that much medicine. Will I overdose"? Pt affect is improved as she is able to hold a conversation. She stated that she "is ready to go back to school and that she wants to either be a CNA or a cosmetologist.  She expressed concern that she would be "kicked out for missing so many days". Visible in dayroom with appropriate behaviors. Cooperative with plan of care. Denies pain and voices no additional concerns at this time. Encouragement and support provided. Safety maintained. Will continue to monitor.
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
## 2017-03-31 NOTE — [facility] Group Notes (Signed)
[facility] Group Notes:  (Nursing/MHT/Case Management/Adjunct)  Date:  03/31/2017  Time:  11:25 PM  Type of Therapy:  Group Therapy  Participation Level:  Active  Participation Quality:  Appropriate  Affect:  Appropriate  Cognitive:  Appropriate  Insight:  Appropriate  Engagement in Group:  Engaged  Modes of Intervention:  Discussion  Summary of Progress/Problems:  Burt EkJanice Marie Deby Adger 03/31/2017, 11:25 PM
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
## 2017-03-31 NOTE — [facility] Group Notes (Signed)
[facility] LCSW Group Therapy  03/31/2017 2:49 PM  Type of Therapy:  Group Therapy  Participation Level:  Minimal  Participation Quality:  Attentive  Affect:  Anxious and Blunted  Cognitive:  Disorganized, Delusional and Lacking  Insight:  Limited, Poor and Patient was fixated on her discharge plan.  Engagement in Therapy:  Lacking and Limited  Modes of Intervention:  Activity, Clarification, Discussion, Education, Exploration, Limit-setting, Problem-solving, Reality Testing, Socialization and Support  Summary of Progress/Problems: Coping Skills: Patients defined and discussed healthy coping skills. Patients identified healthy coping skills they would like to try during hospitalization and after discharge. CSW offered insight to varying coping skills that may have been new to patients such as practicing mindfulness. Patient came to group on this date. When CSW asked patient how she was doing today, Patient stated "I just don't feel right I feel like something bad is going to happen." Patient was preoccupied with her discharge and repeatedly asked about being discharge tomorrow. Patient also shared she took her braids out because "I feel like it's a root in my head". CSW provided support to patient and discussed positive coping skills.   Merlie Noga G. Garnette CzechSampson MSW, LCSWA 03/31/2017, 2:53 PM

## 2017-04-01 LAB — HCG, QUANTITATIVE, PREGNANCY: hCG, Beta Chain, Quant, S: 66958 m[IU]/mL — ABNORMAL HIGH (ref <5)

## 2017-04-01 MED ORDER — PRENATAL PLUS 27-1 MG PO TABS: 1.0000 | ORAL_TABLET | Freq: Every day | ORAL | 1 refills | Status: DC

## 2017-04-01 MED ORDER — FOLIC ACID 1 MG PO TABS: 2.0000 mg | ORAL_TABLET | Freq: Every day | ORAL | 1 refills | Status: DC

## 2017-04-01 MED ORDER — OLANZAPINE 15 MG PO TBDP: 30.0000 mg | ORAL_TABLET | Freq: Every day | ORAL | 1 refills | Status: DC

## 2017-04-01 NOTE — [facility] Suicide Risk Assessment (Signed)
[facility]   Principal Problem: Schizophrenia spectrum disorder with psychotic disorder type not yet determined Discharge Diagnoses:  Patient Active Problem List   Diagnosis Date Noted  . 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 First trimester pregnancy [Z34.90] 03/27/2017  . Schizophrenia spectrum disorder with psychotic disorder type not yet determined [F29] 03/27/2017    Total Time spent with patient: 30 minutes  Musculoskeletal: Strength & Muscle Tone: within normal limits Gait & Station: normal Patient leans: N/A  Psychiatric Specialty Exam: Review of Systems  Psychiatric/Behavioral: The patient is nervous/anxious.   All other systems reviewed and are negative.   Blood pressure (!) 113/49, pulse 78, temperature 98.7 F (37.1 C), temperature source Oral, resp. rate 19, height 5\' 4"  (1.626 m), weight 99.8 kg (220 lb), SpO2 100 %.Body mass index is 37.76 kg/m.  General Appearance: Casual  Eye Contact::  Good  Speech:  Clear and Coherent409  Volume:  Normal  Mood:  Anxious  Affect:  Appropriate  Thought Process:  Goal Directed and Descriptions of Associations: Intact  Orientation:  Full (Time, Place, and Person)  Thought Content:  WDL  Suicidal Thoughts:  No  Homicidal Thoughts:  No  Memory:  Immediate;   Fair Recent;   Fair Remote;   Fair  Judgement:  Impaired  Insight:  Shallow  Psychomotor Activity:  Normal  Concentration:  Fair  Recall:  FiservFair  Fund of Knowledge:Fair  Language: Fair  Akathisia:  No  Handed:  Right  AIMS (if indicated):     Assets:  Communication Skills Desire for Improvement Financial Resources/Insurance Housing Physical Health Resilience Social Support Transportation Vocational/Educational  Sleep:  Number of Hours: 7.45  Cognition: WNL  ADL's:  Intact   Mental Status Per Nursing Assessment::   On Admission:  NA  Demographic Factors:   Adolescent or young adult  Loss Factors: NA  Historical Factors: Family history of mental illness or substance abuse and Impulsivity  Risk Reduction Factors:   Pregnancy, Sense of responsibility to family, Living with another person, especially a relative and Positive social support  Continued Clinical Symptoms:  Depression:   Recent sense of peace/wellbeing Schizophrenia:   Depressive state Less than 21 years old  Cognitive Features That Contribute To Risk:  None    Suicide Risk:  Minimal: No identifiable suicidal ideation.  Patients presenting with no risk factors but with morbid ruminations; may be classified as minimal risk based on the severity of the depressive symptoms    Plan Of Care/Follow-up recommendations:  Activity:  as tolerated. Diet:  regular. Other:  keep follow up appointments.    Kristine LineaJolanta Leea Rambeau, MD 04/01/2017, 1:07 PM

## 2017-04-01 NOTE — Progress Notes (Signed)
Provided and reviewed discharge paperwork and prescriptions. Verified understanding by use of teach back method. Verbalizes understanding as well. Denies SI/HI/AVH, pain, vaginal discharge. Belongings returned as noted on discharge. Pt's mother to pick her up for transportation home once mother is off work. Safety maintained with every 15 minute checks. Will continue to monitor.

## 2017-04-01 NOTE — [facility] Group Notes (Signed)
[facility] Group Notes:  (Nursing/MHT/Case Management/Adjunct)  Date:  04/01/2017  Time:  4:14 PM  Type of Therapy:  Psychoeducational Skills  Participation Level:  Active  Participation Quality:  Redirectable and Sharing  Affect:  Anxious, Blunted and Excited  Cognitive:  Disorganized  Insight:  Lacking  Engagement in Group:  Engaged and Limited  Modes of Intervention:  Discussion and Education  Summary of Progress/Problems:  Tracy Poole M Tracy Poole 04/01/2017, 4:14 PM

## 2017-04-01 NOTE — Progress Notes (Signed)
Recreation Therapy Notes  Date: 05.07.18 Time: 9:30 am Location: Craft Room  Group Topic: Self-expression  Goal Area(s) Addresses:  Patient will effectively use art as a means of self-expression. Patient will recognize positive benefit of self-expression. Patient will be able to identify one emotion experienced during group session. Patient will identify use art as a coping skill.  Behavioral Response: Attentive, Interactive  Intervention: Two Faces of Me  Activity: Patients were given a blank face worksheet and were instructed to draw a line down the middle. On one side, patients were instructed to draw or write how they felt when they were admitted to the hospital. On the other side, patients were instructed to draw or write how they want to feel when they are d/c.  Education: LRT educated patients on other forms of self-expression.  Education Outcome: Acknowledges education/In group clarification offered  Clinical Observations/Feedback: Patient drew how she felt when she was admitted and how she wants to feel when she is d/c. Patient contributed to group discussion by stating what makes art a good coping skill. Patient was focused on d/c today and kept asking LRT questions about it. LRT had to redirect patient stating she had to wait until she talked to her doctor.  Jacquelynn CreeGreene,Lois Ostrom M, LRT/CTRS 04/01/2017 10:07 AM

## 2017-04-01 NOTE — Progress Notes (Addendum)
Pt presents appropriately this morning. Reports good sleep last night, fair appetite, normal energy, good concentration. Rates depression 0/10, hopelessness 0/10, anxiety 0/10 (low 0-10 high). Denies SI/HI/AVH, pain, vaginal discharge. Goal today is "to tell Dr. Demetrius CharityP that I'm ready to go home and I'm back to myself and I'm ready to go so I can get back to school." Does not voice any paranoia this morning, improvement noted.Pt is medication compliant without hesitation. Completes ADLs independently. Voices readiness to be discharged, reports job interview scheduled tomorrow. Safety maintained with every 15 minute checks. Will continue to monitor.   Spoke with pt's mother-(Vicki Laurence AlyCorbett [phone]) via telephone, requesting to speak with doctor. She reports she feels her daughter's paranoia has improved, is inquiring about discharge plans and reports that her daughter does in fact have a job interview scheduled for tomorrow (Tuesday, 04/02/2017).

## 2017-04-01 NOTE — Progress Notes (Signed)
  [facility] :  Will you be returning to the same living situation after discharge:  Yes,  return home At discharge, do you have transportation home?: Yes,  mother Do you have the ability to pay for your medications: Yes,  has insurance  Release of information consent forms completed and in the chart;  Patient's signature needed at discharge.  Patient to Follow up at: Follow-up Information    [facility] Go on 04/01/2017.   Why:  Please follow-up with [facility] on May 9th at 12:30 PM for your initial assessment. Please bring discharge paperwork to this appointment. If you have questions or concerns, contact office directly.  Contact information: [address] [place] [id number] [phone]           Next level of care provider [facility] Link:no  Safety Planning and Suicide Prevention discussed: Yes,  w mother  Have you used any form of tobacco in the last 30 days? (Cigarettes, Smokeless Tobacco, Cigars, and/or Pipes): Yes  Has patient been referred to the Quitline?: Patient refused referral  Patient has been referred for addiction treatment: Yes  [name] 04/01/2017, 5:37 PM

## 2017-04-01 NOTE — Plan of Care (Signed)
Problem: [facility] Participation in Recreation Therapeutic Interventions Goal: STG-Patient will identify at least five coping skills for ** STG: Coping Skills - Within 4 treatment sessions, patient will verbalize at least 5 coping skills for anger in each of 2 treatment sessions to increase anger management skills.  Outcome: Not Applicable Date Met: 16/75/61 At approximately 1:55 pm, patient reported she felt good about managing her anger and reported she did not need help with it at this time.  Leonette Monarch, LRT/CTRS 05.07.18 3:03 pm
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
## 2017-04-01 NOTE — [facility] Group Notes (Signed)
[facility] LCSW Group Therapy   04/01/2017 1:00 pm Type of Therapy: Group Therapy   Participation Level: Active   Participation Quality: Attentive, Sharing and Supportive   Affect: Appropriate  Cognitive: Alert and Oriented   Insight: Developing/Improving and Engaged   Engagement in Therapy: Developing/Improving and Engaged   Modes of Intervention: Clarification, Confrontation, Discussion, Education, Exploration,  Limit-setting, Orientation, Problem-solving, Rapport Building, Dance movement psychotherapisteality Testing, Socialization and Support   Summary of Progress/Problems: Pt identified obstacles faced currently and processed barriers involved in overcoming these obstacles. Pt identified steps necessary for overcoming these obstacles and explored motivation (internal and external) for facing these difficulties head on. Pt further identified one area of concern in their lives and chose a goal to focus on for today.  Patient identified obstacles that led to hospitalization. CSW discussed CBT approach to decrease negative thoughts. Patient stated coping mechanisms to implement at discharge to better overcome obstacles.  Hampton AbbotKadijah Tyqwan Pink, MSW, LCSW-A 04/01/2017, 2:14PM

## 2017-04-02 NOTE — Progress Notes (Signed)
Recreation Therapy Notes  INPATIENT RECREATION TR PLAN  Patient Details Name: Tracy Poole MRN: [id number] DOB: [birth year] Today's Date: 04/02/2017  Rec Therapy Plan Is patient appropriate for Therapeutic Recreation?: Yes Treatment times per week: At least once a week TR Treatment/Interventions: 1:1 session, Group participation (Comment) (Appropriate participation in daily recreational therapy tx)  Discharge Criteria Pt will be discharged from therapy if:: Treatment goals are met, Discharged Treatment plan/goals/alternatives discussed and agreed upon by:: Patient/family  Discharge Summary Short term goals set: See Care Plan Short term goals met: Other (Comment) (Not applicable) Reason goals not met: Patient reported the she felt good about managing her anger and no longer needed help with it at this time Therapeutic equipment acquired: None Reason patient discharged from therapy: Discharge from hospital Date patient discharged from therapy: 04/01/17   Leonette Monarch, LRT/CTRS 04/02/2017, 8:43 AM

## 2017-04-05 ENCOUNTER — Other Ambulatory Visit: Payer: Self-pay | Admitting: Family Medicine

## 2017-04-05 ENCOUNTER — Ambulatory Visit
Admit: 2017-04-05 | Discharge: 2017-04-05 | Disposition: A | Discharge status: Home / Self Care | Payer: Medicaid Other | Attending: Family Medicine | Admitting: Family Medicine

## 2017-04-05 ENCOUNTER — Ambulatory Visit
Admission: RE | Admit: 2017-04-05 | Discharge: 2017-04-05 | Disposition: A | Discharge status: Home / Self Care | Payer: Medicaid Other | Source: Ambulatory Visit | Attending: Family Medicine | Admitting: Family Medicine

## 2017-04-05 DIAGNOSIS — Z3481 Encounter for supervision of other normal pregnancy, first trimester: Secondary | ICD-10-CM

## 2017-04-05 DIAGNOSIS — Z3687 Encounter for antenatal screening for uncertain dates: Secondary | ICD-10-CM

## 2017-04-05 DIAGNOSIS — Z3A01 Less than 8 weeks gestation of pregnancy: Secondary | ICD-10-CM | POA: Diagnosis not present

## 2017-04-05 DIAGNOSIS — O3481 Maternal care for other abnormalities of pelvic organs, first trimester: Secondary | ICD-10-CM | POA: Diagnosis not present

## 2017-04-12 ENCOUNTER — Encounter: Payer: Self-pay | Admitting: Emergency Medicine

## 2017-04-12 ENCOUNTER — Emergency Department
Admission: EM | Admit: 2017-04-12 | Discharge: 2017-04-12 | Disposition: A | Discharge status: Home / Self Care | Payer: Medicaid Other | Attending: Emergency Medicine | Admitting: Emergency Medicine

## 2017-04-12 DIAGNOSIS — Z5321 Procedure and treatment not carried out due to patient leaving prior to being seen by health care provider: Secondary | ICD-10-CM | POA: Diagnosis not present

## 2017-04-12 DIAGNOSIS — R51 Headache: Secondary | ICD-10-CM | POA: Diagnosis not present

## 2017-04-12 NOTE — ED Notes (Signed)
Went in to assess patient. PA was in the room with patient. Patient told PA, "I am fine." patient refused to sign. Patient and mom left ED.
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
## 2017-05-16 ENCOUNTER — Other Ambulatory Visit: Payer: Self-pay | Admitting: Family Medicine

## 2017-05-17 ENCOUNTER — Other Ambulatory Visit: Payer: Self-pay | Admitting: Family Medicine

## 2017-05-17 DIAGNOSIS — N9489 Other specified conditions associated with female genital organs and menstrual cycle: Secondary | ICD-10-CM

## 2017-05-27 ENCOUNTER — Ambulatory Visit: Payer: Medicaid Other

## 2017-06-05 ENCOUNTER — Other Ambulatory Visit: Payer: Self-pay | Admitting: Family Medicine

## 2017-06-05 ENCOUNTER — Ambulatory Visit
Admission: RE | Admit: 2017-06-05 | Discharge: 2017-06-05 | Disposition: A | Discharge status: Home / Self Care | Payer: Medicaid Other | Source: Ambulatory Visit | Attending: Family Medicine | Admitting: Family Medicine

## 2017-06-05 DIAGNOSIS — Z3A16 16 weeks gestation of pregnancy: Secondary | ICD-10-CM | POA: Insufficient documentation

## 2017-06-05 DIAGNOSIS — N949 Unspecified condition associated with female genital organs and menstrual cycle: Secondary | ICD-10-CM | POA: Diagnosis not present

## 2017-06-05 DIAGNOSIS — O26892 Other specified pregnancy related conditions, second trimester: Secondary | ICD-10-CM | POA: Diagnosis present

## 2017-06-05 DIAGNOSIS — N9489 Other specified conditions associated with female genital organs and menstrual cycle: Secondary | ICD-10-CM

## 2017-06-12 ENCOUNTER — Other Ambulatory Visit: Payer: Self-pay | Admitting: Family Medicine

## 2017-06-12 DIAGNOSIS — Z3689 Encounter for other specified antenatal screening: Secondary | ICD-10-CM

## 2017-06-20 ENCOUNTER — Ambulatory Visit: Payer: Medicaid Other | Attending: Family Medicine

## 2017-07-11 ENCOUNTER — Ambulatory Visit
Admission: RE | Admit: 2017-07-11 | Discharge: 2017-07-11 | Disposition: A | Discharge status: Home / Self Care | Payer: Medicaid Other | Source: Ambulatory Visit | Attending: Family Medicine | Admitting: Family Medicine

## 2017-07-11 ENCOUNTER — Ambulatory Visit (HOSPITAL_BASED_OUTPATIENT_CLINIC_OR_DEPARTMENT_OTHER)
Admission: RE | Admit: 2017-07-11 | Discharge: 2017-07-11 | Disposition: A | Discharge status: Home / Self Care | Payer: Medicaid Other | Source: Ambulatory Visit | Attending: Obstetrics and Gynecology | Admitting: Obstetrics and Gynecology

## 2017-07-11 ENCOUNTER — Encounter: Payer: Self-pay | Admitting: *Deleted

## 2017-07-11 DIAGNOSIS — Z3689 Encounter for other specified antenatal screening: Secondary | ICD-10-CM

## 2017-07-11 DIAGNOSIS — O99212 Obesity complicating pregnancy, second trimester: Secondary | ICD-10-CM | POA: Diagnosis not present

## 2017-07-11 DIAGNOSIS — E669 Obesity, unspecified: Secondary | ICD-10-CM | POA: Diagnosis not present

## 2017-07-11 DIAGNOSIS — Q909 Down syndrome, unspecified: Secondary | ICD-10-CM | POA: Insufficient documentation

## 2017-07-11 DIAGNOSIS — O281 Abnormal biochemical finding on antenatal screening of mother: Secondary | ICD-10-CM | POA: Insufficient documentation

## 2017-07-11 DIAGNOSIS — O360920 Maternal care for other rhesus isoimmunization, second trimester, not applicable or unspecified: Secondary | ICD-10-CM | POA: Diagnosis not present

## 2017-07-11 DIAGNOSIS — Z3A21 21 weeks gestation of pregnancy: Secondary | ICD-10-CM | POA: Diagnosis not present

## 2017-07-11 DIAGNOSIS — O289 Unspecified abnormal findings on antenatal screening of mother: Secondary | ICD-10-CM

## 2017-07-11 NOTE — Progress Notes (Signed)
Referring Provider:  [facility] Length of Consultation: 45 minutes   Ms. Tracy Poole was referred to [facility] for genetic counseling and targeted ultrasound because of an increased risk for Down syndrome by the maternal serum prenatal screen.  This note summarizes the information we discussed.   We provided background information on the maternal serum prenatal screen.  It was explained that this is a maternal blood test performed between the 14th and 21st week of pregnancy which measures several pregnancy proteins.  The levels of these proteins can help determine if a pregnancy is at high risk for certain birth defects or problems.  However, it cannot diagnose or rule out these defects.  An abnormal maternal serum screen does not necessarily mean that the baby has a health condition.  Maternal serum screening can identify approximately 80% of neural tube defects, up to 75% of Down syndrome cases and 60% of trisomy 18 cases.  The neural tube consists of the fetal head and spine and if this structure fails to close during development, spina bifida (open spine) or anencephaly (open skull) could result.  Chromosomes are the inherited structures that contain our instructions for development (genes).  Each cell in our body normally has 46 chromosomes.  Rarely, when an egg and sperm unite, an extra or missing chromosome can be passed on to the baby by mistake.  These types of chromosome problems typically cause mental retardation and might also cause birth defects.  We discussed Down syndrome (an extra chromosome #21) and trisomy 50 (an extra chromosome #18).    The maternal serum screen revealed protein levels that increased the chance of Down syndrome (Trisomy 21) in the pregnancy.  Before maternal serum screening, the age-related chance of Down syndrome in the pregnancy was 1 in 1,170.  Given the maternal serum screen results, the chance is now estimated to be 1 in 6.   This means that the chance the baby does not have Down syndrome is greater than 80 percent.  The following prenatal testing options for this pregnancy:  Targeted ultrasound uses high frequency sound waves to create an image of the developing fetus.  An ultrasound is often recommended as a routine means of evaluating the pregnancy.  It is also used to screen for fetal anatomy problems (for example, a heart defect) that might be suggestive of a chromosomal or other abnormality.    Amniocentesis involves the removal of a small amount of amniotic fluid from the sac surrounding the fetus with the use of a thin needle inserted through the maternal abdomen and uterus.  Ultrasound guidance is used throughout the procedure.  Fetal cells are directly evaluated and > 98% of neural tube defects can be detected.  The main risks to this procedure include complications leading to miscarriage in less than 1 in 200 cases (0.5%).    We also reviewed the availability of cell free fetal DNA testing from maternal blood to determine whether or not the baby may have Down syndrome, trisomy 65, or trisomy 77.  This test utilizes a maternal blood sample and DNA sequencing technology to isolate circulating cell free fetal DNA from maternal plasma.  The fetal DNA can then be analyzed for DNA sequences that are derived from the three most common chromosomes involved in aneuploidy, chromosomes 13, 18, and 21.  If the overall amount of DNA is greater than the expected level for any of these chromosomes, aneuploidy is suspected.  While we do not consider it  a replacement for invasive testing and karyotype analysis, a negative result from this testing would be reassuring, though not a guarantee of a normal chromosome complement for the baby.  An abnormal result is certainly suggestive of an abnormal chromosome complement, though we would still recommend CVS or amniocentesis to confirm any findings from this testing.  Cystic Fibrosis and  Spinal Muscular Atrophy (SMA) screening were also discussed with the patient. Both conditions are recessive, which means that both parents must be carriers in order to have a child with the disease.  Cystic fibrosis (CF) is one of the most common genetic conditions in persons of Caucasian ancestry.  This condition occurs in approximately 1 in 2,500 Caucasian persons and results in thickened secretions in the lungs, digestive, and reproductive systems.  For a baby to be at risk for having CF, both of the parents must be carriers for this condition.  Approximately 1 in 725 Caucasian persons is a carrier for CF.  Current carrier testing looks for the most common mutations in the gene for CF and can detect approximately 90% of carriers in the Caucasian population.  This means that the carrier screening can greatly reduce, but cannot eliminate, the chance for an individual to have a child with CF.  If an individual is found to be a carrier for CF, then carrier testing would be available for the partner. As part of [place]'s newborn screening profile, all babies born in the state of [place] will have a two-tier screening process.  Specimens are first tested to determine the concentration of immunoreactive trypsinogen (IRT).  The top 5% of specimens with the highest IRT values then undergo DNA testing using a panel of over 40 common CF mutations. SMA is a neurodegenerative disorder that leads to atrophy of skeletal muscle and overall weakness.  This condition is also more prevalent in the Caucasian population, with 1 in 40-1 in 60 persons being a carrier and 1 in 6,000-1 in 10,000 children being affected.  There are multiple forms of the disease, with some causing death in infancy to other forms with survival into adulthood.  The genetics of SMA is complex, but carrier screening can detect up to 95% of carriers in the Caucasian population.  Similar to CF, a negative result can greatly reduce, but cannot  eliminate, the chance to have a child with SMA.  We obtained a detailed family history and pregnancy history.  The remainder of the family history is unremarkable for birth defects, developmental delays, recurrent pregnancy loss or known chromosome abnormalities.  [name] stated that this is her second pregnancy, the first with this partner.  She had an early miscarriage in her first pregnancy.  The father of the baby has two healthy children from a prior relationship.  She reported that she was prescribed abilify and zyprexa early in this pregnancy but that she only took them briefly and is not currently taking any medications.  She feels like she is doing well without medication and felt concerned about possible harm to the fetus from medication.  While neither of these medications is known to increase the risk for birth defects, we reviewed the fact that medications are not expected to increase the risk for chromosome conditions including Down syndrome.  After consideration of the options, Ms. Nicklin elected to proceed with an ultrasound and cell free fetal DNA testing.  She declined CF and SMA carrier screening.  We requested a copy of her prenatal labs to include hemoglobinopathy testing  and CBC, but have not yet received them from [facility]. If hemoglobinopathy testing has not been performed, we would recommend offering that testing.  An ultrasound at the time of this visit showed no structural difference or soft markers suggestive of a chromosome condition.  However, it is important to remember that ultrasound is not diagnostic for chromosome conditions.  Given the elevated hCG level (4.54 MoM), we would recommend follow up ultrasound for growth in the third trimester.  This was scheduled today.  The patient was encouraged to call with questions or concerns. We can be contacted at [phone].    Cherly Anderson, MS, CGC

## 2017-07-12 NOTE — Progress Notes (Signed)
Tracy Wells, MS, CGC performed an integral service incident to the physician's initial service.  I was physically present in the clinical area and was immediately available to render assistance.   Shawntina Diffee C Tameron Lama  

## 2017-07-17 LAB — INFORMASEQ(SM) WITH XY ANALYSIS
Fetal Fraction (%):: 9.5
Fetal Number: 1
Gestational Age at Collection: 21 weeks
WEIGHT: 244 [lb_av]

## 2017-07-18 ENCOUNTER — Telehealth: Payer: Self-pay | Admitting: Obstetrics and Gynecology

## 2017-07-18 NOTE — Telephone Encounter (Signed)
The patient was informed of the results of her recent InformaSeq testing (performed at [facility]) which yielded NEGATIVE results.  The patient's specimen showed DNA consistent with two copies of chromosomes 21, 18 and 13.  The sensitivity for trisomy 63, trisomy 62 and trisomy 41 using this testing are reported as 99.1%, 98.3% and 98.1% respectively.  Thus, while the results of this testing are highly accurate, they are not considered diagnostic at this time.  Should more definitive information be desired, the patient may still consider amniocentesis.   As requested to know by the patient, sex chromosome analysis was included for this sample.  Results was consistent with a female (XX) fetus. This is predicted with >97% accuracy.  A maternal serum AFP only should be considered if screening for neural tube defects is desired.  Given the abnormal maternal serum screening results with the elevated hCG (4.54 MoM), we have scheduled her for a follow up ultrasound for growth on September 05, 2017 at 9am.  Cherly Anderson, MS, [facility]

## 2017-08-09 ENCOUNTER — Emergency Department
Admission: EM | Admit: 2017-08-09 | Discharge: 2017-08-09 | Disposition: A | Discharge status: Home / Self Care | Payer: Medicaid Other | Attending: Emergency Medicine | Admitting: Emergency Medicine

## 2017-08-09 ENCOUNTER — Encounter: Payer: Self-pay | Admitting: Emergency Medicine

## 2017-08-09 DIAGNOSIS — Z87891 Personal history of nicotine dependence: Secondary | ICD-10-CM | POA: Diagnosis not present

## 2017-08-09 DIAGNOSIS — F29 Unspecified psychosis not due to a substance or known physiological condition: Secondary | ICD-10-CM

## 2017-08-09 DIAGNOSIS — F418 Other specified anxiety disorders: Secondary | ICD-10-CM | POA: Insufficient documentation

## 2017-08-09 DIAGNOSIS — Z3A24 24 weeks gestation of pregnancy: Secondary | ICD-10-CM | POA: Diagnosis not present

## 2017-08-09 DIAGNOSIS — E876 Hypokalemia: Secondary | ICD-10-CM

## 2017-08-09 DIAGNOSIS — O99340 Other mental disorders complicating pregnancy, unspecified trimester: Secondary | ICD-10-CM | POA: Insufficient documentation

## 2017-08-09 HISTORY — DX: Anxiety disorder, unspecified: F41.9

## 2017-08-09 LAB — CBC WITH DIFFERENTIAL/PLATELET
BASOS PCT: 0 %
Basophils Absolute: 0 10*3/uL (ref 0–0.1)
Eosinophils Absolute: 0.1 10*3/uL (ref 0–0.7)
Eosinophils Relative: 1 %
HCT: 36.4 % (ref 35.0–47.0)
Hemoglobin: 12.7 g/dL (ref 12.0–16.0)
Lymphocytes Relative: 9 %
Lymphs Abs: 1.1 10*3/uL (ref 1.0–3.6)
MCH: 30.5 pg (ref 26.0–34.0)
MCHC: 34.8 g/dL (ref 32.0–36.0)
MCV: 87.8 fL (ref 80.0–100.0)
MONO ABS: 0.5 10*3/uL (ref 0.2–0.9)
MONOS PCT: 5 %
NEUTROS ABS: 10.1 10*3/uL — AB (ref 1.4–6.5)
NEUTROS PCT: 85 %
Platelets: 212 10*3/uL (ref 150–440)
RBC: 4.15 MIL/uL (ref 3.80–5.20)
RDW: 14.9 % — AB (ref 11.5–14.5)
WBC: 11.8 10*3/uL — ABNORMAL HIGH (ref 3.6–11.0)

## 2017-08-09 LAB — URINE DRUG SCREEN, QUALITATIVE (ARMC ONLY)
AMPHETAMINES, UR SCREEN: NOT DETECTED
BENZODIAZEPINE, UR SCRN: NOT DETECTED
Barbiturates, Ur Screen: NOT DETECTED
CANNABINOID 50 NG, UR ~~LOC~~: NOT DETECTED
Cocaine Metabolite,Ur ~~LOC~~: NOT DETECTED
MDMA (ECSTASY) UR SCREEN: NOT DETECTED
METHADONE SCREEN, URINE: NOT DETECTED
Opiate, Ur Screen: NOT DETECTED
PHENCYCLIDINE (PCP) UR S: NOT DETECTED
TRICYCLIC, UR SCREEN: NOT DETECTED

## 2017-08-09 LAB — COMPREHENSIVE METABOLIC PANEL
ALBUMIN: 3.2 g/dL — AB (ref 3.5–5.0)
ALT: 25 U/L (ref 14–54)
ANION GAP: 9 (ref 5–15)
AST: 25 U/L (ref 15–41)
Alkaline Phosphatase: 79 U/L (ref 38–126)
CO2: 22 mmol/L (ref 22–32)
Calcium: 9 mg/dL (ref 8.9–10.3)
Chloride: 109 mmol/L (ref 101–111)
Creatinine, Ser: 0.47 mg/dL (ref 0.44–1.00)
GFR calc Af Amer: >60 mL/min (ref >60)
GFR calc non Af Amer: >60 mL/min (ref >60)
GLUCOSE: 95 mg/dL (ref 65–99)
POTASSIUM: 2.7 mmol/L — AB (ref 3.5–5.1)
SODIUM: 140 mmol/L (ref 135–145)
TOTAL PROTEIN: 6.7 g/dL (ref 6.5–8.1)
Total Bilirubin: 0.6 mg/dL (ref 0.3–1.2)

## 2017-08-09 LAB — ETHANOL: Alcohol, Ethyl (B): <5 mg/dL (ref <5)

## 2017-08-09 LAB — SALICYLATE LEVEL

## 2017-08-09 LAB — ACETAMINOPHEN LEVEL: Acetaminophen (Tylenol), Serum: <10 ug/mL — ABNORMAL LOW (ref 10–30)

## 2017-08-09 MED ORDER — POTASSIUM CHLORIDE CRYS ER 20 MEQ PO TBCR
40.0000 meq | EXTENDED_RELEASE_TABLET | Freq: Once | ORAL | Status: AC
  Administered 2017-08-09: 40 meq via ORAL
  Filled 2017-08-09: qty 2

## 2017-08-09 MED ORDER — OLANZAPINE 5 MG PO TABS: 5.0000 mg | ORAL_TABLET | Freq: Every day | ORAL | 0 refills | Status: DC

## 2017-08-09 MED ORDER — POTASSIUM CHLORIDE ER 10 MEQ PO TBCR: 40.0000 meq | EXTENDED_RELEASE_TABLET | Freq: Two times a day (BID) | ORAL | 0 refills | Status: DC

## 2017-08-09 NOTE — BH Assessment (Signed)
Assessment Note  Tracy Poole is an 21 y.o. female who presents to the ER due her mother having concerns about her mental & emotional state. Per the patient, she woke up in the night afraid and heart racing. She states this is the first time this has happened. She's prescribed Zyprexa but stop taking it because she was afraid it's going to harm her unborn child. She start back taking it approximately two days ago. 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  Per the report of the patient's mother ([phone]) she stop taking her medications approximately a month ago. Since then the patient has had an increase of paranoia. She believes people are trying to harm her. On last night, woke up fearful and anxious.  During the interview, the patient was calm and pleasant. She was suspicious of this Clinical research associate. She asked that the door remain open and writer stay near it. "Nothing against you. I don't feel comfortable with people near me. I want to go home."  Patient denies SI/HI and AV/H. She denies the use of any mind-altering substances. As well as no involvement with the legal system.  Diagnosis: Depression & Anxiety  Past Medical History:  Past Medical History:  Diagnosis Date  . Anxiety   . Depression     Past Surgical History:  Procedure Laterality Date  . EUSTACHIAN TUBE DILATION    . HERNIA REPAIR      Family History: History reviewed. No pertinent family history.  Social History:  reports that she has quit smoking. Her smoking use included Cigarettes. She smoked 1.00 pack per day. She has never used smokeless tobacco. She reports that she does not drink alcohol or use drugs.  Additional Social History:  Alcohol / Drug Use Pain Medications: See PTA Prescriptions: See PTA Over the Counter: See PTA History of alcohol / drug use?: No history of alcohol / drug abuse Longest period of sobriety (when/how long):  n/a Negative Consequences of Use:  (n/a) Withdrawal Symptoms:  (n/a)  CIWA: CIWA-Ar BP: 130/78 Pulse Rate: (!) 108 COWS:    Allergies: No Known Allergies  Home Medications:  (Not in a hospital admission)  OB/GYN Status:  Patient's last menstrual period was 02/08/2017 (exact date).  General Assessment Data Assessment unable to be completed: Yes Location of Assessment: [facility] ED TTS Assessment: In system Is this a Tele or Face-to-Face Assessment?: Face-to-Face Is this an Initial Assessment or a Re-assessment for this encounter?: Initial Assessment Marital status: Single Maiden name: n/a Is patient pregnant?: No Pregnancy Status: No Living Arrangements: Parent Can pt return to current living arrangement?: Yes Admission Status: Involuntary Is patient capable of signing voluntary admission?: No (Under IVC) Referral Source: Self/Family/Friend Insurance type: Medicaid  Medical Screening Exam [facility] Walk-in ONLY) Medical Exam completed: Yes  Crisis Care Plan Living Arrangements: Parent Legal Guardian: Other: (Self) Name of Psychiatrist: [facility] Name of Therapist: [facility]  Education Status Is patient currently in school?: No Current Grade: n/a Highest grade of school patient has completed: n/a Name of school: n/a Contact person: n/a  Risk to self with the past 6 months Suicidal Ideation: No Has patient been a risk to self within the past 6 months prior to admission? : No Suicidal Intent: No Has patient had any suicidal intent within the past 6 months prior to admission? : No Is patient at risk for suicide?: No Suicidal Plan?: No Has patient had any suicidal plan within the  past 6 months prior to admission? : No Access to Means: No What has been your use of drugs/alcohol within the last 12 months?: Reports of none Previous Attempts/Gestures: No How many times?: 0 Other Self Harm Risks: Reports of none Triggers for Past Attempts: Other (Comment) Intentional Self Injurious  Behavior: None Family Suicide History: No Recent stressful life event(s): Other (Comment) (Off medications) Persecutory voices/beliefs?: No Depression: Yes Depression Symptoms: Isolating Substance abuse history and/or treatment for substance abuse?: No Suicide prevention information given to non-admitted patients: Not applicable  Risk to Others within the past 6 months Homicidal Ideation: No Does patient have any lifetime risk of violence toward others beyond the six months prior to admission? : No Thoughts of Harm to Others: No Current Homicidal Intent: No Current Homicidal Plan: No Access to Homicidal Means: No Identified Victim: Reports of none History of harm to others?: No Assessment of Violence: None Noted Violent Behavior Description: Reports of none Does patient have access to weapons?: No Criminal Charges Pending?: No Does patient have a court date: No Is patient on probation?: No  Psychosis Hallucinations: None noted Delusions: None noted  Mental Status Report Appearance/Hygiene: Unremarkable, In scrubs Eye Contact: Good Motor Activity: Freedom of movement, Unremarkable Speech: Soft Level of Consciousness: Alert Mood: Pleasant, Fearful, Suspicious, Anxious Affect: Blunted Anxiety Level: Minimal Thought Processes: Coherent, Relevant Judgement: Unimpaired Orientation: Person, Place, Time, Situation, Appropriate for developmental age Obsessive Compulsive Thoughts/Behaviors: Minimal  Cognitive Functioning Concentration: Normal Memory: Recent Intact, Remote Intact IQ: Average Insight: Fair Impulse Control: Fair Appetite: Good Weight Loss: 0 Weight Gain: 0 Sleep: No Change Total Hours of Sleep: 8 Vegetative Symptoms: None  ADLScreening [facility] Assessment Services) Patient's cognitive ability adequate to safely complete daily activities?: Yes Patient able to express need for assistance with ADLs?: Yes Independently performs ADLs?: Yes (appropriate for  developmental age)  Prior Inpatient Therapy Prior Inpatient Therapy: Yes Prior Therapy Dates: 03/2017 Prior Therapy Facilty/Provider(s): [facility] Reason for Treatment: Psychosis  Prior Outpatient Therapy Prior Outpatient Therapy: Yes Prior Therapy Dates: Current Prior Therapy Facilty/Provider(s): [facility] Reason for Treatment: Depression & Psychosis Does patient have an ACCT team?: No Does patient have Intensive In-House Services?  : No Does patient have [facility] services? : No Does patient have P4CC services?: No  ADL Screening (condition at time of admission) Patient's cognitive ability adequate to safely complete daily activities?: Yes Is the patient deaf or have difficulty hearing?: No Does the patient have difficulty seeing, even when wearing glasses/contacts?: No Does the patient have difficulty concentrating, remembering, or making decisions?: No Patient able to express need for assistance with ADLs?: Yes Does the patient have difficulty dressing or bathing?: No Independently performs ADLs?: Yes (appropriate for developmental age) Does the patient have difficulty walking or climbing stairs?: No Weakness of Legs: None Weakness of Arms/Hands: None  Home Assistive Devices/Equipment Home Assistive Devices/Equipment: None  Therapy Consults (therapy consults require a physician order) PT Evaluation Needed: No OT Evalulation Needed: No SLP Evaluation Needed: No Abuse/Neglect Assessment (Assessment to be complete while patient is alone) Physical Abuse: Denies Verbal Abuse: Denies Sexual Abuse: Denies Exploitation of patient/patient's resources: Denies Self-Neglect: Denies Values / Beliefs Cultural Requests During Hospitalization: None Spiritual Requests During Hospitalization: None Consults Spiritual Care Consult Needed: No Social Work Consult Needed: No Merchant navy officer (For Healthcare) Does Patient Have a Medical Advance Directive?: No    Additional Information 1:1  In Past 12 Months?: No CIRT Risk: No Elopement Risk: No Does patient have medical clearance?: Yes  Child/Adolescent Assessment  Running Away Risk: Denies (Patient is an adult)  Disposition:  Disposition Initial Assessment Completed for this Encounter: Yes Disposition of Patient: Other dispositions (ER MD Ordered Psych Consult)  On Site Evaluation by:   Reviewed with Physician:    Lilyan Gilford MS, LCAS, LPC, NCC, CCSI Therapeutic Triage Specialist 08/09/2017 1:29 PM

## 2017-08-09 NOTE — ED Notes (Signed)
Calvin in with pt at this time.

## 2017-08-09 NOTE — ED Triage Notes (Signed)
G2P0A1.  Pt is [redacted] weeks pregnant.  Here with mother.  C/o feeling shaky.  Was on medication for depression/anxiety but stopped taking because pregnant. Anxiety/depression have been worse and mother thinks needs a medication.  Pt denies SI/HI at this time.  VSS.  Pt difficulty to get history from, unable to give RN sx here for.

## 2017-08-09 NOTE — ED Notes (Signed)
With help from officer, pt moved to 23. Dressed out.

## 2017-08-09 NOTE — ED Notes (Signed)
Pt 15 min checklist started at this time. Security notified that pt is IVC and may try to leave.

## 2017-08-09 NOTE — ED Notes (Signed)
Tried to move patient to room 23, pt refusing. Unable to dress out at this time.

## 2017-08-09 NOTE — ED Notes (Signed)
Pt mother at bedside, reports pt is walking around parking lot.

## 2017-08-09 NOTE — Progress Notes (Signed)
While rounding, CH made initial visit to room ED-19H. Pt is sitting on bed and appears agitated. Mother is bedside. Pt has extended personal "space" and asked that I keep my distance. Mother states she is not always this way and encouraged prayer. Pt declined. CH provided warm blankets for the mother and silent prayer for the Pt. CH is available for follow up if needed.    08/09/17 0900  Clinical Encounter Type  Visited With Patient;Patient and family together  Visit Type Initial;Spiritual support;ED  Consult/Referral To Chaplain  Spiritual Encounters  Spiritual Needs Prayer  Advance Directives (For Healthcare)  Does Patient Have a Medical Advance Directive? No

## 2017-08-09 NOTE — ED Notes (Signed)
Found patient by bathroom in lobby, pt refusing to be seen, states she wants to go home. MD notified. MD wants to IVC patient. Officers contacted, working on bringing patient back.

## 2017-08-09 NOTE — ED Notes (Signed)
I walked the patients mother out to the parking lot where here daughter was standing. Hospital police and [employer] officer also standing with the patient. The mother hoping she would be able to talk to her and get her to return to the ED.

## 2017-08-09 NOTE — ED Notes (Signed)
Called [facility] for consult   [phone]

## 2017-08-09 NOTE — ED Provider Notes (Signed)
[facility] Emergency Department Provider Note  ____________________________________________   First MD Initiated Contact with Patient 08/09/17 [phone]     (approximate)  I have reviewed the triage vital signs and the nursing notes.   HISTORY  Chief Complaint Anxiety   HPI Tracy Poole is a 21 y.o. female with a history of anxiety and depression who is [redacted] weeks pregnant who is presenting emergency department today with anxiety and feeling" her medication is not working." Before I was able to speak with her she ran in and out of the emergency department and needed to be escorted back by police after she had gone out to her car and sat in there with the windows up. She is denying any suicidal or homicidal ideation. Says that she is noncompliant with her olanzapine and had stopped it for several weeks and took a first dose again last night. She is denying any suicidal or homicidal ideation. However, her mother says that she has been having auditory hallucinations. The patient denies this.   Past Medical History:  Diagnosis Date  . Anxiety   . Depression     Patient Active Problem List   Diagnosis Date Noted  . Abnormal findings on antenatal screening   . Major depressive disorder, single episode, severe with psychotic features (HCC) 03/28/2017  . First trimester pregnancy 03/27/2017  . Schizophrenia spectrum disorder with psychotic disorder type not yet determined 03/27/2017    Past Surgical History:  Procedure Laterality Date  . EUSTACHIAN TUBE DILATION    . HERNIA REPAIR      Prior to Admission medications   Medication Sig Start Date End Date Taking? Authorizing Provider  OLANZapine zydis (ZYPREXA) 15 MG disintegrating tablet Take 2 tablets (30 mg total) by mouth at bedtime. 04/01/17  Yes Pucilowska, Jolanta B, MD  prenatal vitamin w/FE, FA (PRENATAL 1 + 1) 27-1 MG TABS tablet Take 1 tablet by mouth daily at 12 noon. 04/01/17  Yes Pucilowska, Jolanta B,  MD  folic acid (FOLVITE) 1 MG tablet Take 2 tablets (2 mg total) by mouth daily. Patient not taking: Reported on 08/09/2017 04/02/17   Shari Prows, MD    Allergies Patient has no known allergies.  History reviewed. No pertinent family history.  Social History Social History  Substance Use Topics  . Smoking status: Former Smoker    Packs/day: 1.00    Types: Cigarettes  . Smokeless tobacco: Never Used  . Alcohol use No    Review of Systems  Constitutional: No fever/chills Eyes: No visual changes. ENT: No sore throat. Cardiovascular: Denies chest pain. Respiratory: Denies shortness of breath. Gastrointestinal: No abdominal pain.  No nausea, no vomiting.  No diarrhea.  No constipation. Genitourinary: Negative for dysuria. Musculoskeletal: Negative for back pain. Skin: Negative for rash. Neurological: Negative for headaches, focal weakness or numbness.   ____________________________________________   PHYSICAL EXAM:  VITAL SIGNS: ED Triage Vitals  Enc Vitals Group     BP 08/09/17 0900 130/78     Pulse Rate 08/09/17 0900 (!) 108     Resp 08/09/17 0900 20     Temp 08/09/17 0900 98.9 F (37.2 C)     Temp Source 08/09/17 0900 Oral     SpO2 08/09/17 0900 98 %     Weight 08/09/17 0859 240 lb (108.9 kg)     Height 08/09/17 0859  (1.626 m)     Head Circumference --      Peak Flow --  Pain Score --      Pain Loc --      Pain Edu? --      Excl. in GC? --     Constitutional: Alert and oriented. Well appearing and in no acute distress.however, the patient has been several times of "I can go?" Eyes: Conjunctivae are normal.  Head: Atraumatic. Nose: No congestion/rhinnorhea. Mouth/Throat: Mucous membranes are moist.  Neck: No stridor.   Cardiovascular: Tachycardic, regular rhythm. Grossly normal heart sounds.  Good peripheral circulation. Respiratory: Normal respiratory effort.  No retractions. Lungs CTAB. Gastrointestinal: patient is not letting me  examine her abdomen and says "I don't like it when people touch my baby." Musculoskeletal: No lower extremity tenderness nor edema.  No joint effusions. Neurologic:  Normal speech and language. No gross focal neurologic deficits are appreciated. Skin:  Skin is warm, dry and intact. No rash noted. Psychiatric: Mood and affect are normal. Speech and behavior are normal.  ____________________________________________   LABS (all labs ordered are listed, but only abnormal results are displayed)  Labs Reviewed  CBC WITH DIFFERENTIAL/PLATELET - Abnormal; Notable for the following:       Result Value   WBC 11.8 (*)    RDW 14.9 (*)    Neutro Abs 10.1 (*)    All other components within normal limits  COMPREHENSIVE METABOLIC PANEL - Abnormal; Notable for the following:    Potassium 2.7 (*)    BUN <5 (*)    Albumin 3.2 (*)    All other components within normal limits  ACETAMINOPHEN LEVEL - Abnormal; Notable for the following:    Acetaminophen (Tylenol), Serum <10 (*)    All other components within normal limits  ETHANOL  SALICYLATE LEVEL  URINE DRUG SCREEN, QUALITATIVE ([facility] ONLY)   ____________________________________________  EKG   ____________________________________________  RADIOLOGY   ____________________________________________   PROCEDURES  Procedure(s) performed:   Procedures  Critical Care performed:   ____________________________________________   INITIAL IMPRESSION / ASSESSMENT AND PLAN / ED COURSE  Pertinent labs & imaging results that were available during my care of the patient were reviewed by me and considered in my medical decision making (see chart for details).  ----------------------------------------- 1:17 PM on 08/09/2017 -----------------------------------------  I discussed case with Dr. Maricela Bo of psychiatry who recommended the patient decrease her medication to 5 mg daily at bedtime of Zyprexa. The patient is calm and cooperative at this  time. She is not suicidal or homicidal. She is understanding the plan and willing to comply. Says that she is feeling her baby move. She'll follow-up with [facility] as an outpatient.she is not responding to any internal stimuli on my observation.      ____________________________________________   FINAL CLINICAL IMPRESSION(S) / ED DIAGNOSES  Psychosis.    NEW MEDICATIONS STARTED DURING THIS VISIT:  New Prescriptions   No medications on file     Note:  This document was prepared using Dragon voice recognition software and may include unintentional dictation errors.     Myrna Blazer, MD 08/09/17 1318

## 2017-08-09 NOTE — ED Notes (Signed)
MD at bedside, talking to patient.

## 2017-08-09 NOTE — ED Notes (Signed)
Pt ambulated to bathroom unassisted.

## 2017-08-09 NOTE — ED Notes (Signed)
Explained to patient if she would like to be seen, she needs to be in her assigned bed for the doctor to see her. Pt anxious, backing away as trying to speak. States, "Ok I am coming."

## 2017-08-09 NOTE — ED Notes (Signed)
Pt moved to room 23, changed into wine scrubs with pt mom and this tech in room. Pt gave mom permission to take pt belongings and mom has left the room. Pt is waiting on SOC at this time with computer set up in room.

## 2017-08-09 NOTE — ED Notes (Signed)
Pt refused lunch tray and said she did not want it. Pt was informed at this time that is all we have and pt stated she didn't want anything. She wants to go home.

## 2017-08-09 NOTE — ED Notes (Signed)
Pt gave permission for mom to have passcode. Passcode given to mom, copy placed on pts chart. Mom given sheets on visiting hours. Mom will take pts belongings home with her.

## 2017-09-05 ENCOUNTER — Other Ambulatory Visit: Payer: Self-pay | Admitting: *Deleted

## 2017-09-05 ENCOUNTER — Ambulatory Visit
Admission: RE | Admit: 2017-09-05 | Discharge: 2017-09-05 | Disposition: A | Discharge status: Home / Self Care | Payer: Medicaid Other | Source: Ambulatory Visit | Attending: Obstetrics & Gynecology | Admitting: Obstetrics & Gynecology

## 2017-09-05 VITALS — BP 125/62 | HR 105 | Temp 98.5°F | Resp 18 | Wt 243.6 lb

## 2017-09-05 DIAGNOSIS — Z362 Encounter for other antenatal screening follow-up: Secondary | ICD-10-CM | POA: Diagnosis present

## 2017-09-05 DIAGNOSIS — O280 Abnormal hematological finding on antenatal screening of mother: Secondary | ICD-10-CM | POA: Diagnosis present

## 2017-09-05 DIAGNOSIS — O365990 Maternal care for other known or suspected poor fetal growth, unspecified trimester, not applicable or unspecified: Secondary | ICD-10-CM | POA: Insufficient documentation

## 2017-09-05 DIAGNOSIS — O365930 Maternal care for other known or suspected poor fetal growth, third trimester, not applicable or unspecified: Secondary | ICD-10-CM | POA: Insufficient documentation

## 2017-09-05 DIAGNOSIS — Z3A29 29 weeks gestation of pregnancy: Secondary | ICD-10-CM | POA: Diagnosis not present

## 2017-09-09 ENCOUNTER — Other Ambulatory Visit: Payer: Self-pay

## 2017-09-12 ENCOUNTER — Ambulatory Visit
Admission: RE | Admit: 2017-09-12 | Discharge: 2017-09-12 | Disposition: A | Discharge status: Home / Self Care | Payer: Medicaid Other | Source: Ambulatory Visit | Attending: Obstetrics and Gynecology | Admitting: Obstetrics and Gynecology

## 2017-09-12 DIAGNOSIS — O321XX0 Maternal care for breech presentation, not applicable or unspecified: Secondary | ICD-10-CM | POA: Diagnosis not present

## 2017-09-12 DIAGNOSIS — O280 Abnormal hematological finding on antenatal screening of mother: Secondary | ICD-10-CM | POA: Insufficient documentation

## 2017-09-12 DIAGNOSIS — Z3A30 30 weeks gestation of pregnancy: Secondary | ICD-10-CM | POA: Insufficient documentation

## 2017-09-12 DIAGNOSIS — O365990 Maternal care for other known or suspected poor fetal growth, unspecified trimester, not applicable or unspecified: Secondary | ICD-10-CM | POA: Diagnosis not present

## 2017-09-16 ENCOUNTER — Other Ambulatory Visit: Payer: Self-pay

## 2017-09-16 ENCOUNTER — Other Ambulatory Visit: Payer: Self-pay | Admitting: *Deleted

## 2017-09-16 DIAGNOSIS — O365930 Maternal care for other known or suspected poor fetal growth, third trimester, not applicable or unspecified: Secondary | ICD-10-CM

## 2017-09-19 ENCOUNTER — Other Ambulatory Visit: Payer: Self-pay

## 2017-09-23 ENCOUNTER — Other Ambulatory Visit: Payer: Self-pay | Admitting: *Deleted

## 2017-09-26 ENCOUNTER — Inpatient Hospital Stay: Admission: RE | Admit: 2017-09-26 | Payer: Self-pay | Source: Ambulatory Visit

## 2017-09-26 ENCOUNTER — Other Ambulatory Visit: Payer: Self-pay | Admitting: *Deleted

## 2017-09-26 DIAGNOSIS — O365930 Maternal care for other known or suspected poor fetal growth, third trimester, not applicable or unspecified: Secondary | ICD-10-CM

## 2017-09-29 ENCOUNTER — Encounter: Payer: Self-pay | Admitting: Obstetrics and Gynecology

## 2017-09-29 ENCOUNTER — Observation Stay
Admission: EM | Admit: 2017-09-29 | Discharge: 2017-09-29 | Payer: Medicaid Other | Attending: Obstetrics & Gynecology | Admitting: Obstetrics & Gynecology

## 2017-09-29 DIAGNOSIS — Z87891 Personal history of nicotine dependence: Secondary | ICD-10-CM | POA: Insufficient documentation

## 2017-09-29 DIAGNOSIS — Z3A32 32 weeks gestation of pregnancy: Secondary | ICD-10-CM | POA: Diagnosis not present

## 2017-09-29 DIAGNOSIS — F209 Schizophrenia, unspecified: Secondary | ICD-10-CM | POA: Insufficient documentation

## 2017-09-29 DIAGNOSIS — O99343 Other mental disorders complicating pregnancy, third trimester: Secondary | ICD-10-CM | POA: Diagnosis not present

## 2017-09-29 DIAGNOSIS — O365930 Maternal care for other known or suspected poor fetal growth, third trimester, not applicable or unspecified: Secondary | ICD-10-CM | POA: Diagnosis present

## 2017-09-29 DIAGNOSIS — Z3490 Encounter for supervision of normal pregnancy, unspecified, unspecified trimester: Secondary | ICD-10-CM

## 2017-09-29 MED ORDER — SODIUM CHLORIDE FLUSH 0.9 % IV SOLN
INTRAVENOUS | Status: AC
  Filled 2017-09-29: qty 10

## 2017-09-29 MED ORDER — LACTATED RINGERS IV SOLN
Freq: Once | INTRAVENOUS | Status: AC
  Administered 2017-09-29: 10:00:00 via INTRAVENOUS

## 2017-09-29 MED ORDER — BETAMETHASONE SOD PHOS & ACET 6 (3-3) MG/ML IJ SUSP
INTRAMUSCULAR | Status: AC
  Filled 2017-09-29: qty 1

## 2017-09-29 MED ORDER — BETAMETHASONE SOD PHOS & ACET 6 (3-3) MG/ML IJ SUSP
12.0000 mg | INTRAMUSCULAR | Status: DC
  Administered 2017-09-29: 12 mg via INTRAMUSCULAR

## 2017-09-29 NOTE — H&P (Signed)
Tracy Poole is a [age] y.o. female presenting for  "contractions x 20 mins and called ambulance as no transportation. [facility] and seen sporadically due to transportation issues. Pt denies ROM, VB, decreased FM. Pt is conversing but, making poor eye contact. LMP ? With EDD of 11/20/17 based on 16 week US. Pt states she is not taking her Zyprexa as it makes her dizzy. Pt lives at home with mom.  OB History    Gravida Para Term Preterm AB Living   2       1 0   SAB TAB Ectopic Multiple Live Births   1             Past Medical History:  Diagnosis Date  . Anxiety   . Depression    Past Surgical History:  Procedure Laterality Date  . EUSTACHIAN TUBE DILATION    . HERNIA REPAIR     Family History: family history is not on file. Pt [facility] not [facility].  Social History:  reports that she has quit smoking. Her smoking use included cigarettes. She smoked 1.00 pack per day. she has never used smokeless tobacco. She reports that she does not drink alcohol or use drugs.     Maternal Diabetes: not found but, glucose 95 in the past on labs  Genetic Screening: no report found  Maternal Ultrasounds/Referrals: [facility] seen on 09/12/17 with baby <3%, normal AFI, normal Dopplers, pt has missed appts and no further records found.  Fetal Ultrasounds or other Referrals:  None Maternal Substance Abuse:  No Significant Maternal Medications:  Zyprexa 5 mg Significant Maternal Lab Results:  None Other Comments:  None  Review of Systems  Constitutional: Negative.   HENT: Negative.   Eyes: Negative.   Respiratory: Negative.   Cardiovascular: Negative.   Gastrointestinal: Negative.   Genitourinary: Negative.   Musculoskeletal: Negative.   Skin: Negative.   Neurological: Negative.   Endo/Heme/Allergies: Negative.   Psychiatric/Behavioral: Positive for depression and hallucinations. Negative for memory loss, substance abuse and suicidal ideas. The patient is not  nervous/anxious and does not have insomnia.    History Dilation: Closed Exam by:: C Jones, CNM Blood pressure 120/60, pulse 87, temperature 98.4 F (36.9 C), temperature source Oral, resp. rate 18, height 5\' 4"  (1.626 m), weight 117.5 kg (259 lb), last menstrual period 02/08/2017. Exam Physical Exam  Prenatal labs: ABO, Rh: --/--/O NEG (05/04 1642) Antibody: NEG (05/04 1642) Rubella:  not found  RPR:   not found  HBsAg:   not found  HIV:   not found  GBS:   unknown  Assessment/Plan: A:1. IUP at 32 weeks 2. Auditory hallucinations per hx (none today) 3. Schizophrenia P:1. Disc with Dr Elesa MassedWard as pt had 09/12/17 US indicating <3% for EFW, normal Dopplers, AFI WNL. Pt had 1 UC with  1 late decel and baby is minimally reactive   2. Pt is non-compliant with meds and appointments.  Sharee Pimplearon W Jones 09/29/2017, 7:29 AM

## 2017-09-29 NOTE — Progress Notes (Signed)
Patient ID: Tracy Poole, female   DOB: 11/10/1996, 21 y.o.   MRN: [id number] 1st BMZ 12.5mg  being given now.

## 2017-09-29 NOTE — OB Triage Note (Signed)
Pt arrived to L&D via EMS with c/o upper abdominal pain/cramping that started at 0500 this morning. Pain has now resolved upon pt's arrival. Pt denies leaking of fluid, vaginal bleeding, or decreased fetal movement. Pt did noted a small amount of brown vaginal discharge. Toco and EFM monitors applied, will monitor closely.

## 2017-09-29 NOTE — Progress Notes (Addendum)
Patient ID: Tracy Poole, female   DOB: 09/16/1996, 21 y.o.   MRN: [id number] Pt has [facility] to room 5428 to be transferred due to <3% and poor compliance with US, NST and baby is minimally reactive, had 1 late decel and occas variable.  At 083643m, a 2nd late deccel was noted with her 2nd ctx in one hour

## 2017-09-30 MED ORDER — OXYCODONE HCL 5 MG PO TABS
5.00 | ORAL_TABLET | ORAL | Status: DC
Start: ? — End: 2017-09-30

## 2017-09-30 MED ORDER — METHYLERGONOVINE MALEATE 0.2 MG/ML IJ SOLN
.20 | INTRAMUSCULAR | Status: DC
Start: ? — End: 2017-09-30

## 2017-09-30 MED ORDER — MISOPROSTOL 200 MCG PO TABS
800.00 | ORAL_TABLET | ORAL | Status: DC
Start: ? — End: 2017-09-30

## 2017-09-30 MED ORDER — GENERIC EXTERNAL MEDICATION
6.25 | Status: DC
Start: ? — End: 2017-09-30

## 2017-09-30 MED ORDER — METHYLERGONOVINE MALEATE 0.2 MG PO TABS
0.20 | ORAL_TABLET | ORAL | Status: DC
Start: ? — End: 2017-09-30

## 2017-09-30 MED ORDER — CARBOPROST TROMETHAMINE 250 MCG/ML IM SOLN
250.00 | INTRAMUSCULAR | Status: DC
Start: ? — End: 2017-09-30

## 2017-09-30 MED ORDER — NALOXONE HCL 0.4 MG/ML IJ SOLN
0.40 | INTRAMUSCULAR | Status: DC
Start: ? — End: 2017-09-30

## 2017-09-30 MED ORDER — ACETAMINOPHEN 325 MG PO TABS
975.00 | ORAL_TABLET | ORAL | Status: DC
Start: 2017-09-30 — End: 2017-09-30

## 2017-09-30 MED ORDER — KETOROLAC TROMETHAMINE 15 MG/ML IJ SOLN
15.00 | INTRAMUSCULAR | Status: DC
Start: 2017-09-30 — End: 2017-09-30

## 2017-09-30 MED ORDER — IBUPROFEN 600 MG PO TABS
600.00 | ORAL_TABLET | ORAL | Status: DC
Start: 2017-10-01 — End: 2017-09-30

## 2017-09-30 MED ORDER — ONDANSETRON HCL 4 MG/2ML IJ SOLN
4.00 | INTRAMUSCULAR | Status: DC
Start: ? — End: 2017-09-30

## 2017-09-30 MED ORDER — IRON PO
1.00 | ORAL | Status: DC
Start: 2017-10-01 — End: 2017-09-30

## 2017-09-30 MED ORDER — DOCUSATE SODIUM 100 MG PO CAPS
100.00 | ORAL_CAPSULE | ORAL | Status: DC
Start: 2017-09-30 — End: 2017-09-30

## 2017-09-30 MED ORDER — OXYCODONE HCL 5 MG PO TABS
10.00 | ORAL_TABLET | ORAL | Status: DC
Start: ? — End: 2017-09-30

## 2017-09-30 MED ORDER — OXYTOCIN IJ
10.00 | INTRAMUSCULAR | Status: DC
Start: ? — End: 2017-09-30

## 2017-09-30 MED ORDER — DIPHENOXYLATE-ATROPINE 2.5-0.025 MG PO TABS
2.00 | ORAL_TABLET | ORAL | Status: DC
Start: ? — End: 2017-09-30

## 2017-09-30 NOTE — Discharge Summary (Signed)
Obstetrical Discharge Summary  Patient Name: Tracy Poole DOB: 06/24/1996 MRN: [id number]  Date of Admission: 09/29/2017 Date of Delivery: N/A Delivered by: N/A Date of Discharge: 09/30/2017  Primary OB:  Phineas Realharles Drew  NWG:[id number]:Patient's last menstrual period was 02/08/2017 (exact date). EDC Estimated Date of Delivery: 11/20/17 Gestational Age at Delivery: 4142w5d   Antepartum complications: IUGR, Poor compliance with visits and psych meds Admitting Diagnosis: IUGR hx, late decels with first and 2nd contraction Secondary Diagnosis: Transportation issues necessitating pt to miss care, Schizophrenia Patient Active Problem List   Diagnosis Date Noted  . Pregnancy 09/29/2017  . Pregnancy affected by fetal growth restriction 09/05/2017  . Abnormal findings on antenatal screening   . Major depressive disorder, single episode, severe with psychotic features (HCC) 03/28/2017  . First trimester pregnancy 03/27/2017  . Schizophrenia spectrum disorder with psychotic disorder type not yet determined (HCC) 03/27/2017    Augmentation: None Complications: IUGR, Late decels Intrapartum complications/course:  Date of Delivery: N/A Delivered By: N/A Delivery Type: N/A Anesthesia: N/A Placenta: N/A Laceration: N/A Episiotomy: none Newborn Data: This patient has no babies on file.   Postpartum Procedures: N/A  Ante partum course:  Pt had IUGR baby and had no care x 2.5 weeks, missed Dopplers x 2 weeks and NST and pt also not taking her Schizo meds.  [facility]. BMZ x 1 dose.    Discharge Physical Exam:  BP (!) 124/56   Pulse 85   Temp 98.6 F (37 C) (Oral)   Resp 18   Ht 5\' 4"  (1.626 m)   Wt 117 kg (258 lb)   LMP 02/08/2017 (Exact Date)   SpO2 100%   BMI 44.29 kg/m   General: NAD CV: RRR Pulm: CTABL, nl effort ABD: s/nd/nt, fundus firm and below the umbilicus Lochia: moderate Incision: c/d/i DVT Evaluation: LE non-ttp, no evidence of DVT on  exam.  Hemoglobin  Date Value Ref Range Status  08/09/2017 12.7 12.0 - 16.0 g/dL Final   HGB  Date Value Ref Range Status  04/06/2014 14.7 12.0 - 16.0 g/dL Final   HCT  Date Value Ref Range Status  08/09/2017 36.4 35.0 - 47.0 % Final  04/06/2014 44.4 35.0 - 47.0 % Final     Disposition: stable, discharge to home. Baby Feeding: formula Baby Disposition: N/A  Rh Immune globulin given: see notes Rubella vaccine given: see notes Tdap vaccine given in AP or PP setting: N/A Flu vaccine given in AP or PP setting: N/A  Contraception: N/A  Prenatal Labs:   See chart  Plan:  Tracy Poole was discharged to home in good condition. Follow-up appointment with delivering provider in 6 weeks.  Discharge Medications: Allergies as of 09/29/2017   No Known Allergies     Medication List    ASK your doctor about these medications   folic acid 1 MG tablet Commonly known as:  FOLVITE Take 2 tablets (2 mg total) by mouth daily.   OLANZapine 5 MG tablet Commonly known as:  ZYPREXA Take 1 tablet (5 mg total) by mouth at bedtime.   prenatal vitamin w/FE, FA 27-1 MG Tabs tablet Take 1 tablet by mouth daily at 12 noon.       Signed: Sharee Pimplearon W. Jones, RN, MSN, CNM, FNP

## 2017-10-03 DIAGNOSIS — Z6841 Body Mass Index (BMI) 40.0 and over, adult: Secondary | ICD-10-CM | POA: Insufficient documentation

## 2017-10-10 ENCOUNTER — Encounter: Payer: Self-pay | Admitting: Emergency Medicine

## 2017-10-10 ENCOUNTER — Emergency Department
Admission: EM | Admit: 2017-10-10 | Discharge: 2017-10-10 | Discharge status: Against Medical Advice | Payer: Medicaid Other | Attending: Emergency Medicine | Admitting: Emergency Medicine

## 2017-10-10 DIAGNOSIS — Z5321 Procedure and treatment not carried out due to patient leaving prior to being seen by health care provider: Secondary | ICD-10-CM | POA: Insufficient documentation

## 2017-10-10 DIAGNOSIS — R102 Pelvic and perineal pain: Secondary | ICD-10-CM | POA: Insufficient documentation

## 2017-10-10 NOTE — ED Notes (Addendum)
FIRST NURSE NOTE: Pt requesting that "box" be taken off. States box was placed after c-section to help with healing per patient. Pt alert and oriented X4, active, cooperative, pt in NAD. RR even and unlabored, color WNL.

## 2017-10-10 NOTE — ED Triage Notes (Signed)
Pt came in to have a box that is used for healing for c-section checked.  Pt states she missed her OB f/u appointment.  Pt also states she is continuing to have a lot of pain.  Pt states [facility]  Pt states she also needs more medication for pain.  No redness to site and no fever noted.  Pt in NAD, A&Ox4, ambulatory to triage.

## 2017-10-10 NOTE — ED Notes (Signed)
Pt up to desk, asking wait time. States she is on the transport list for the bus that leaves at 5. Pt states that she is leaving in order to have a rid home. Pt alert and oriented X4, active, cooperative, pt in NAD. RR even and unlabored, color WNL.  Pt ambulatory.

## 2017-10-14 ENCOUNTER — Emergency Department: Payer: Medicaid Other

## 2017-10-14 ENCOUNTER — Emergency Department
Admission: EM | Admit: 2017-10-14 | Discharge: 2017-10-15 | Disposition: A | Discharge status: Home / Self Care | Payer: Medicaid Other | Attending: Emergency Medicine | Admitting: Emergency Medicine

## 2017-10-14 DIAGNOSIS — Z4801 Encounter for change or removal of surgical wound dressing: Secondary | ICD-10-CM | POA: Diagnosis not present

## 2017-10-14 DIAGNOSIS — Z87891 Personal history of nicotine dependence: Secondary | ICD-10-CM | POA: Insufficient documentation

## 2017-10-14 DIAGNOSIS — Z4889 Encounter for other specified surgical aftercare: Secondary | ICD-10-CM | POA: Insufficient documentation

## 2017-10-14 DIAGNOSIS — Z79899 Other long term (current) drug therapy: Secondary | ICD-10-CM | POA: Diagnosis not present

## 2017-10-14 LAB — COMPREHENSIVE METABOLIC PANEL
ALK PHOS: 98 U/L (ref 38–126)
ALT: 48 U/L (ref 14–54)
AST: 40 U/L (ref 15–41)
Albumin: 3.9 g/dL (ref 3.5–5.0)
Anion gap: 10 (ref 5–15)
BUN: 7 mg/dL (ref 6–20)
CALCIUM: 9 mg/dL (ref 8.9–10.3)
CHLORIDE: 104 mmol/L (ref 101–111)
CO2: 24 mmol/L (ref 22–32)
CREATININE: 0.67 mg/dL (ref 0.44–1.00)
GFR calc Af Amer: >60 mL/min (ref >60)
Glucose, Bld: 105 mg/dL — ABNORMAL HIGH (ref 65–99)
Potassium: 3.1 mmol/L — ABNORMAL LOW (ref 3.5–5.1)
Sodium: 138 mmol/L (ref 135–145)
Total Bilirubin: 0.6 mg/dL (ref 0.3–1.2)
Total Protein: 7.3 g/dL (ref 6.5–8.1)

## 2017-10-14 LAB — CBC WITH DIFFERENTIAL/PLATELET
Basophils Absolute: 0.1 10*3/uL (ref 0–0.1)
Basophils Relative: 1 %
EOS PCT: 2 %
Eosinophils Absolute: 0.2 10*3/uL (ref 0–0.7)
HCT: 39.2 % (ref 35.0–47.0)
Hemoglobin: 13.2 g/dL (ref 12.0–16.0)
LYMPHS ABS: 2.1 10*3/uL (ref 1.0–3.6)
LYMPHS PCT: 24 %
MCH: 31.4 pg (ref 26.0–34.0)
MCHC: 33.7 g/dL (ref 32.0–36.0)
MCV: 92.9 fL (ref 80.0–100.0)
MONO ABS: 0.4 10*3/uL (ref 0.2–0.9)
Monocytes Relative: 5 %
Neutro Abs: 6.2 10*3/uL (ref 1.4–6.5)
Neutrophils Relative %: 68 %
PLATELETS: 215 10*3/uL (ref 150–440)
RBC: 4.22 MIL/uL (ref 3.80–5.20)
RDW: 14.6 % — AB (ref 11.5–14.5)
WBC: 9 10*3/uL (ref 3.6–11.0)

## 2017-10-14 LAB — LIPASE, BLOOD: LIPASE: 35 U/L (ref 11–51)

## 2017-10-14 LAB — TROPONIN I: Troponin I: <0.03 ng/mL (ref <0.03)

## 2017-10-14 NOTE — ED Triage Notes (Signed)
Patient reports she had baby on 11/5. Patient has wound vac in place for C-section.   Patient c/o chest pain, upper abdominal, lower back pain, and nausea.

## 2017-10-15 LAB — URINALYSIS, COMPLETE (UACMP) WITH MICROSCOPIC
BACTERIA UA: NONE SEEN
BILIRUBIN URINE: NEGATIVE
Glucose, UA: NEGATIVE mg/dL
KETONES UR: NEGATIVE mg/dL
LEUKOCYTES UA: NEGATIVE
Nitrite: NEGATIVE
Protein, ur: NEGATIVE mg/dL
Specific Gravity, Urine: 1.02 (ref 1.005–1.030)
pH: 6 (ref 5.0–8.0)

## 2017-10-15 NOTE — ED Provider Notes (Signed)
[facility] Emergency Department Provider Note     First MD Initiated Contact with Patient 10/14/17 2353     (approximate)  I have reviewed the triage vital signs and the nursing notes.   HISTORY  Chief Complaint Post-op Problem and Chest Pain    HPI Tracy Poole is a 21 y.o. female below list of chronic medical conditions occluding recent C-section performed on 2018 presents to the emergency department with request to have a wound VAC removed.  Patient states that wound VAC was supposed to be removed 1 week ago today however she was unable to make it to [facility] secondary to lack of transportation.  Patient states that she spoke with [facility] who informed her that she can go to her doctor or a local emergency department to have it removed.  In addition the patient admits to lower abdominal discomfort and low back pain and nausea.  Patient denies any fever.  Patient denies any vomiting    Past Medical History:  Diagnosis Date  . Anxiety   . Depression     Patient Active Problem List   Diagnosis Date Noted  . Pregnancy 09/29/2017  . Pregnancy affected by fetal growth restriction 09/05/2017  . Abnormal findings on antenatal screening   . Major depressive disorder, single episode, severe with psychotic features (HCC) 03/28/2017  . First trimester pregnancy 03/27/2017  . Schizophrenia spectrum disorder with psychotic disorder type not yet determined (HCC) 03/27/2017    Past Surgical History:  Procedure Laterality Date  . CESAREAN SECTION    . EUSTACHIAN TUBE DILATION    . HERNIA REPAIR      Prior to Admission medications   Medication Sig Start Date End Date Taking? Authorizing Provider  folic acid (FOLVITE) 1 MG tablet Take 2 tablets (2 mg total) by mouth daily. Patient not taking: Reported on 08/09/2017 04/02/17   Pucilowska, Braulio ConteJolanta B, MD  OLANZapine (ZYPREXA) 5 MG tablet Take 1 tablet (5 mg total) by mouth at bedtime. Patient not taking: Reported on  09/29/2017 08/09/17 08/09/18  Schaevitz, Myra Rudeavid Matthew, MD  prenatal vitamin w/FE, FA (PRENATAL 1 + 1) 27-1 MG TABS tablet Take 1 tablet by mouth daily at 12 noon. 04/01/17   Pucilowska, Ellin GoodieJolanta B, MD    Allergies No known drug allergies No family history on file.  Social History Social History   Tobacco Use  . Smoking status: Former Smoker    Packs/day: 1.00    Types: Cigarettes  . Smokeless tobacco: Never Used  Substance Use Topics  . Alcohol use: No  . Drug use: No    Review of Systems Constitutional: No fever/chills Eyes: No visual changes. ENT: No sore throat. Cardiovascular: Denies chest pain. Respiratory: Denies shortness of breath. Gastrointestinal: Positive for lower abdominal pain and nausea  genitourinary: Negative for dysuria. Musculoskeletal: Positive for back pain. Skin: Negative for rash. Neurological: Negative for headaches, focal weakness or numbness.  ____________________________________________   PHYSICAL EXAM:  VITAL SIGNS: ED Triage Vitals  Enc Vitals Group     BP 10/14/17 2118 (!) 127/50     Pulse Rate 10/14/17 2118 90     Resp 10/14/17 2118 18     Temp 10/14/17 2118 98.1 F (36.7 C)     Temp Source 10/14/17 2118 Oral     SpO2 10/14/17 2118 99 %     Weight 10/14/17 2118 117 kg (258 lb)     Height 10/14/17 2118 1.626 m (5\' 4" )     Head Circumference --  Peak Flow --      Pain Score 10/14/17 2125 8     Pain Loc --      Pain Edu? --      Excl. in GC? --     Constitutional: Alert and oriented. Well appearing and in no acute distress. Eyes: Conjunctivae are normal. PERRL. EOMI. Head: Atraumatic. Nose: No congestion/rhinnorhea. Mouth/Throat: Mucous membranes are moist.  Oropharynx non-erythematous. Neck: No stridor.   Hematological/Lymphatic/Immunilogical: No cervical lymphadenopathy. Cardiovascular: Normal rate, regular rhythm. Grossly normal heart sounds.  Good peripheral circulation. Respiratory: Normal respiratory effort.  No  retractions. Lungs CTAB. Gastrointestinal: Soft and nontender. No distention. No abdominal bruits. No CVA tenderness. Musculoskeletal: No lower extremity tenderness nor edema.  No joint effusions. Neurologic:  Normal speech and language. No gross focal neurologic deficits are appreciated. No gait instability. Skin:  Skin is warm, dry and intact. No rash noted. Psychiatric: Mood and affect are normal. Speech and behavior are normal.  ____________________________________________   LABS (all labs ordered are listed, but only abnormal results are displayed)  Labs Reviewed  COMPREHENSIVE METABOLIC PANEL - Abnormal; Notable for the following components:      Result Value   Potassium 3.1 (*)    Glucose, Bld 105 (*)    All other components within normal limits  CBC WITH DIFFERENTIAL/PLATELET - Abnormal; Notable for the following components:   RDW 14.6 (*)    All other components within normal limits  TROPONIN I  LIPASE, BLOOD  URINALYSIS, COMPLETE (UACMP) WITH MICROSCOPIC    RADIOLOGY  Dg Chest 2 View  Result Date: 10/14/2017 CLINICAL DATA:  21 y/o F; patient reports C-section 09/30/2017. Patient presents with chest pain, upper abdominal pain, lower back pain, and nausea. EXAM: CHEST  2 VIEW COMPARISON:  07/16/2013 chest radiograph FINDINGS: Stable heart size and mediastinal contours are within normal limits. Both lungs are clear. The visualized skeletal structures are unremarkable. IMPRESSION: No active cardiopulmonary disease. Electronically Signed   By: Mitzi HansenLance  Furusawa-Stratton M.D.   On: 10/14/2017 21:51    Procedures    INITIAL IMPRESSION / ASSESSMENT AND PLAN / ED COURSE  As part of my medical decision making, I reviewed the following data within the electronic MEDICAL RECORD NUMBER Spoke with Dr.Grotegut ([facility]) who advised that it was okay to remove the wound VAC.  Patient's PICO wound vac removed  patient advised of how to care for the wound at  home.  ____________________________________________   FINAL CLINICAL IMPRESSION(S) / ED DIAGNOSES  Final diagnoses:  Encounter for postoperative wound care     ED Discharge Orders    None       Note:  This document was prepared using Dragon voice recognition software and may include unintentional dictation errors.   Darci CurrentBrown, [place], MD 10/15/17 [phone]

## 2017-10-15 NOTE — ED Notes (Signed)
Pt given cup to collect urine. Verbalized understanding.

## 2017-10-15 NOTE — ED Notes (Addendum)
Dr. Manson PasseyBrown at bedside for pt evaluation. Pt states she had a wound vac placed after her c-section 2 weeks ago and was supposed to have it removed last week. Pt reports [facility] and was told she could be seen locally to have it removed. Pt states she wants to have it removed. Has been seen in this ED for the same last week.

## 2018-03-23 ENCOUNTER — Other Ambulatory Visit: Payer: Self-pay

## 2018-03-23 ENCOUNTER — Encounter: Payer: Self-pay | Admitting: *Deleted

## 2018-03-23 ENCOUNTER — Emergency Department
Admission: EM | Admit: 2018-03-23 | Discharge: 2018-03-24 | Disposition: A | Discharge status: Home / Self Care | Payer: Medicaid Other | Attending: Emergency Medicine | Admitting: Emergency Medicine

## 2018-03-23 DIAGNOSIS — Z79899 Other long term (current) drug therapy: Secondary | ICD-10-CM | POA: Insufficient documentation

## 2018-03-23 DIAGNOSIS — F32A Depression, unspecified: Secondary | ICD-10-CM

## 2018-03-23 DIAGNOSIS — F419 Anxiety disorder, unspecified: Secondary | ICD-10-CM | POA: Diagnosis present

## 2018-03-23 DIAGNOSIS — F329 Major depressive disorder, single episode, unspecified: Secondary | ICD-10-CM | POA: Insufficient documentation

## 2018-03-23 DIAGNOSIS — Z87891 Personal history of nicotine dependence: Secondary | ICD-10-CM | POA: Diagnosis not present

## 2018-03-23 DIAGNOSIS — F209 Schizophrenia, unspecified: Secondary | ICD-10-CM | POA: Insufficient documentation

## 2018-03-23 NOTE — ED Provider Notes (Signed)
[facility] Emergency Department Provider Note  Time seen: 8:52 PM  I have reviewed the triage vital signs and the nursing notes.   HISTORY  Chief Complaint Anxiety    HPI Tracy Poole is a 22 y.o. female with a past medical history of anxiety, depression, presents to the emergency department for anxiety and depression.  According to the patient she had a baby 5 months ago, states she has had worsening stress over being a new mother, trying to find a job, Catering manager.  States she has become depressed once again and feels anxious very often.  Patient denies any SI or HI.  States she was on depression/anxiety medication prior to her pregnancy and is wishing to get back on medication.  Patient does not breast-feed.  Denies any medical complaints today.  Negative review of systems.   Past Medical History:  Diagnosis Date  . Anxiety   . Depression     Patient Active Problem List   Diagnosis Date Noted  . Pregnancy 09/29/2017  . Pregnancy affected by fetal growth restriction 09/05/2017  . Abnormal findings on antenatal screening   . Major depressive disorder, single episode, severe with psychotic features (HCC) 03/28/2017  . First trimester pregnancy 03/27/2017  . Schizophrenia spectrum disorder with psychotic disorder type not yet determined (HCC) 03/27/2017    Past Surgical History:  Procedure Laterality Date  . CESAREAN SECTION    . EUSTACHIAN TUBE DILATION    . HERNIA REPAIR      Prior to Admission medications   Medication Sig Start Date End Date Taking? Authorizing Provider  folic acid (FOLVITE) 1 MG tablet Take 2 tablets (2 mg total) by mouth daily. Patient not taking: Reported on 08/09/2017 04/02/17   Pucilowska, Braulio Conte B, MD  OLANZapine (ZYPREXA) 5 MG tablet Take 1 tablet (5 mg total) by mouth at bedtime. Patient not taking: Reported on 09/29/2017 08/09/17 08/09/18  Schaevitz, Myra Rude, MD  prenatal vitamin w/FE, FA (PRENATAL 1 + 1) 27-1 MG TABS tablet  Take 1 tablet by mouth daily at 12 noon. 04/01/17   Pucilowska, Ellin Goodie, MD    No Known Allergies  No family history on file.  Social History Social History   Tobacco Use  . Smoking status: Former Smoker    Packs/day: 1.00    Types: Cigarettes  . Smokeless tobacco: Never Used  Substance Use Topics  . Alcohol use: No  . Drug use: No    Review of Systems Constitutional: Negative for fever. Eyes: Negative for visual complaints ENT: Negative for recent illness/congestion Cardiovascular: Negative for chest pain. Respiratory: Negative for shortness of breath. Gastrointestinal: Negative for abdominal pain Genitourinary: Negative for urinary compaints Musculoskeletal: Negative for musculoskeletal complaints Skin: Negative for skin complaints  Neurological: Negative for headache All other ROS negative  ____________________________________________   PHYSICAL EXAM:  VITAL SIGNS: ED Triage Vitals  Enc Vitals Group     BP 03/23/18 1959 131/74     Pulse Rate 03/23/18 1959 100     Resp 03/23/18 1959 20     Temp 03/23/18 1959 98.6 F (37 C)     Temp Source 03/23/18 1959 Oral     SpO2 03/23/18 1959 98 %     Weight 03/23/18 1958 250 lb (113.4 kg)     Height 03/23/18 1958  (1.626 m)     Head Circumference --      Peak Flow --      Pain Score --      Pain  Loc --      Pain Edu? --      Excl. in GC? --     Constitutional: Alert and oriented. Well appearing and in no distress. Eyes: Normal exam ENT   Head: Normocephalic and atraumatic.   Mouth/Throat: Mucous membranes are moist. Cardiovascular: Normal rate, regular rhythm. No murmur Respiratory: Normal respiratory effort without tachypnea nor retractions. Breath sounds are clear Gastrointestinal: Soft and nontender. No distention.   Musculoskeletal: Nontender with normal range of motion in all extremities.  Neurologic:  Normal speech and language. No gross focal neurologic deficits  Skin:  Skin is warm, dry and  intact.  Psychiatric: Mood and affect are normal.  Denies SI or HI.  ____________________________________________   INITIAL IMPRESSION / ASSESSMENT AND PLAN / ED COURSE  Pertinent labs & imaging results that were available during my care of the patient were reviewed by me and considered in my medical decision making (see chart for details).  Patient presents emergency department for depression and anxiety, states they have become worse over the past several months and trying to care for her newborn child finding a job along with school issues.  Denies any SI or HI, here voluntarily does not meet IVC criteria.  I did offer the patient telemetry psychiatry consultation, she wishes to speak to the psychiatrist to discuss getting back on medications for depression/anxiety.  I believe this is a reasonable plan of care.  We will place a call for telemetry psychiatry consultation and continue to closely monitor the patient.  She has no medical complaints today with a negative review of system and a normal exam.  Patient is not breast-feeding.  Psychiatry consultation pending.  Patient care signed out to oncoming physician.  ____________________________________________   FINAL CLINICAL IMPRESSION(S) / ED DIAGNOSES  Anxiety Depression    Minna Antis, MD 03/23/18 2239

## 2018-03-23 NOTE — ED Triage Notes (Signed)
Pt reports feeling anxious. Denies SI or HI.  Denies drug or etoh use.  Pt states she trying to find a job, new baby, school.  feels stressed. Pt calm and cooperative.

## 2018-03-24 ENCOUNTER — Other Ambulatory Visit: Payer: Self-pay

## 2018-03-24 ENCOUNTER — Encounter: Payer: Self-pay | Admitting: Emergency Medicine

## 2018-03-24 ENCOUNTER — Emergency Department (EMERGENCY_DEPARTMENT_HOSPITAL)
Admission: EM | Admit: 2018-03-24 | Discharge: 2018-03-25 | Disposition: A | Discharge status: Other Acute Inpatient Hospital | Payer: Medicaid Other | Source: Home / Self Care | Attending: Emergency Medicine | Admitting: Emergency Medicine

## 2018-03-24 DIAGNOSIS — Z87891 Personal history of nicotine dependence: Secondary | ICD-10-CM

## 2018-03-24 DIAGNOSIS — F209 Schizophrenia, unspecified: Secondary | ICD-10-CM | POA: Insufficient documentation

## 2018-03-24 DIAGNOSIS — Z79899 Other long term (current) drug therapy: Secondary | ICD-10-CM

## 2018-03-24 DIAGNOSIS — Z9119 Patient's noncompliance with other medical treatment and regimen: Secondary | ICD-10-CM

## 2018-03-24 DIAGNOSIS — F323 Major depressive disorder, single episode, severe with psychotic features: Secondary | ICD-10-CM | POA: Diagnosis present

## 2018-03-24 DIAGNOSIS — Z9114 Patient's other noncompliance with medication regimen: Secondary | ICD-10-CM

## 2018-03-24 DIAGNOSIS — F259 Schizoaffective disorder, unspecified: Secondary | ICD-10-CM

## 2018-03-24 LAB — COMPREHENSIVE METABOLIC PANEL
ALK PHOS: 85 U/L (ref 38–126)
ALT: 25 U/L (ref 14–54)
AST: 20 U/L (ref 15–41)
Albumin: 4.4 g/dL (ref 3.5–5.0)
Anion gap: 9 (ref 5–15)
BUN: 6 mg/dL (ref 6–20)
CALCIUM: 9.1 mg/dL (ref 8.9–10.3)
CO2: 24 mmol/L (ref 22–32)
CREATININE: 0.76 mg/dL (ref 0.44–1.00)
Chloride: 103 mmol/L (ref 101–111)
Glucose, Bld: 95 mg/dL (ref 65–99)
Potassium: 2.8 mmol/L — ABNORMAL LOW (ref 3.5–5.1)
Sodium: 136 mmol/L (ref 135–145)
Total Bilirubin: 1.7 mg/dL — ABNORMAL HIGH (ref 0.3–1.2)
Total Protein: 7.7 g/dL (ref 6.5–8.1)

## 2018-03-24 LAB — URINE DRUG SCREEN, QUALITATIVE (ARMC ONLY)
Amphetamines, Ur Screen: NOT DETECTED
BARBITURATES, UR SCREEN: NOT DETECTED
Benzodiazepine, Ur Scrn: NOT DETECTED
CANNABINOID 50 NG, UR ~~LOC~~: NOT DETECTED
COCAINE METABOLITE, UR ~~LOC~~: NOT DETECTED
MDMA (Ecstasy)Ur Screen: NOT DETECTED
Methadone Scn, Ur: NOT DETECTED
OPIATE, UR SCREEN: NOT DETECTED
PHENCYCLIDINE (PCP) UR S: NOT DETECTED
TRICYCLIC, UR SCREEN: NOT DETECTED

## 2018-03-24 LAB — SALICYLATE LEVEL

## 2018-03-24 LAB — CBC
HCT: 38.9 % (ref 35.0–47.0)
HEMOGLOBIN: 13.2 g/dL (ref 12.0–16.0)
MCH: 28.8 pg (ref 26.0–34.0)
MCHC: 34 g/dL (ref 32.0–36.0)
MCV: 84.9 fL (ref 80.0–100.0)
Platelets: 180 10*3/uL (ref 150–440)
RBC: 4.58 MIL/uL (ref 3.80–5.20)
RDW: 14 % (ref 11.5–14.5)
WBC: 12.2 10*3/uL — ABNORMAL HIGH (ref 3.6–11.0)

## 2018-03-24 LAB — POCT PREGNANCY, URINE: Preg Test, Ur: NEGATIVE

## 2018-03-24 LAB — ETHANOL

## 2018-03-24 LAB — ACETAMINOPHEN LEVEL: Acetaminophen (Tylenol), Serum: <10 ug/mL — ABNORMAL LOW (ref 10–30)

## 2018-03-24 MED ORDER — POTASSIUM CHLORIDE CRYS ER 20 MEQ PO TBCR
40.0000 meq | EXTENDED_RELEASE_TABLET | Freq: Once | ORAL | Status: AC
  Administered 2018-03-25: 40 meq via ORAL
  Filled 2018-03-24: qty 2

## 2018-03-24 MED ORDER — SERTRALINE HCL 50 MG PO TABS: ORAL_TABLET | ORAL | 0 refills | Status: DC

## 2018-03-24 NOTE — ED Notes (Signed)
Waiting on fax from [facility] doctor in order to DC or admit pt. Secretary placing call to [facility] at this time in order to determine plan.

## 2018-03-24 NOTE — ED Provider Notes (Addendum)
[facility] Emergency Department Provider Note  ____________________________________________   First MD Initiated Contact with Patient 03/24/18 2150     (approximate)  I have reviewed the triage vital signs and the nursing notes.   HISTORY  Chief Complaint Mental Health Problem  History is limited by the patient's psychiatric illness  HPI Tracy Poole is a 22 y.o. female who comes to the emergency department with [facility] police on an involuntary commitment.  The patient is difficult to speak with as she is actively psychotic and disorganized.  She is rambling about her neighbors trying to turn her into a man.   Past Medical History:  Diagnosis Date  . Anxiety   . Depression     Patient Active Problem List   Diagnosis Date Noted  . Pregnancy 09/29/2017  . Pregnancy affected by fetal growth restriction 09/05/2017  . Abnormal findings on antenatal screening   . Major depressive disorder, single episode, severe with psychotic features (HCC) 03/28/2017  . First trimester pregnancy 03/27/2017  . Schizophrenia spectrum disorder with psychotic disorder type not yet determined (HCC) 03/27/2017    Past Surgical History:  Procedure Laterality Date  . CESAREAN SECTION    . EUSTACHIAN TUBE DILATION    . HERNIA REPAIR      Prior to Admission medications   Medication Sig Start Date End Date Taking? Authorizing Provider  folic acid (FOLVITE) 1 MG tablet Take 2 tablets (2 mg total) by mouth daily. Patient not taking: Reported on 08/09/2017 04/02/17   Pucilowska, Braulio Conte B, MD  OLANZapine (ZYPREXA) 5 MG tablet Take 1 tablet (5 mg total) by mouth at bedtime. Patient not taking: Reported on 09/29/2017 08/09/17 08/09/18  Schaevitz, Myra Rude, MD  prenatal vitamin w/FE, FA (PRENATAL 1 + 1) 27-1 MG TABS tablet Take 1 tablet by mouth daily at 12 noon. 04/01/17   Pucilowska, Ellin Goodie, MD  sertraline (ZOLOFT) 50 MG tablet Take 1 tab, PO daily for 4 days then 2 tabs  PO daily thereafter 03/24/18   Myrna Blazer, MD    Allergies Patient has no known allergies.  No family history on file.  Social History Social History   Tobacco Use  . Smoking status: Former Smoker    Packs/day: 1.00    Types: Cigarettes  . Smokeless tobacco: Never Used  Substance Use Topics  . Alcohol use: No  . Drug use: No    Review of Systems History is limited by the patient's clinical condition  ____________________________________________   PHYSICAL EXAM:  VITAL SIGNS: ED Triage Vitals  Enc Vitals Group     BP 03/24/18 2128 129/78     Pulse Rate 03/24/18 2128 89     Resp 03/24/18 2128 18     Temp --      Temp Source 03/24/18 2128 Oral     SpO2 03/24/18 2128 100 %     Weight 03/24/18 2129 250 lb (113.4 kg)     Height 03/24/18 2129  (1.626 m)     Head Circumference --      Peak Flow --      Pain Score 03/24/18 2129 0     Pain Loc --      Pain Edu? --      Excl. in GC? --     Constitutional: Actively psychotic and responding to internal stimuli Eyes: PERRL EOMI. Head: Atraumatic. Nose: No congestion/rhinnorhea. Mouth/Throat: No trismus Neck: No stridor.   Cardiovascular: Normal rate, regular rhythm. Grossly normal heart sounds.  Good peripheral circulation. Respiratory: Normal respiratory effort.  No retractions. Lungs CTAB and moving good air Gastrointestinal: Soft nontender Musculoskeletal: No lower extremity edema   Neurologic:  No gross focal neurologic deficits are appreciated. Skin:  Skin is warm, dry and intact. No rash noted. Psychiatric: Psychotic    ____________________________________________   DIFFERENTIAL includes but not limited to  Postpartum depression, postpartum psychosis, schizophrenia, drug overdose ____________________________________________   LABS (all labs ordered are listed, but only abnormal results are displayed)  Labs Reviewed  COMPREHENSIVE METABOLIC PANEL - Abnormal; Notable for the following  components:      Result Value   Potassium 2.8 (*)    Total Bilirubin 1.7 (*)    All other components within normal limits  ACETAMINOPHEN LEVEL - Abnormal; Notable for the following components:   Acetaminophen (Tylenol), Serum <10 (*)    All other components within normal limits  CBC - Abnormal; Notable for the following components:   WBC 12.2 (*)    All other components within normal limits  ETHANOL  SALICYLATE LEVEL  URINE DRUG SCREEN, QUALITATIVE ([facility] ONLY)  POC URINE PREG, ED  POCT PREGNANCY, URINE    Lab work reviewed by me with clinically insignificant hypokalemia __________________________________________  EKG   ____________________________________________  RADIOLOGY   ____________________________________________   PROCEDURES  Procedure(s) performed: no  Procedures  Critical Care performed: no  Observation: no ____________________________________________   INITIAL IMPRESSION / ASSESSMENT AND PLAN / ED COURSE  Pertinent labs & imaging results that were available during my care of the patient were reviewed by me and considered in my medical decision making (see chart for details).  The patient is actively psychotic and unable to safely care for herself.  I am up holding her involuntary commitment and will place a consult for psychiatry.  She is medically stable at this time.      ____________________________________________   FINAL CLINICAL IMPRESSION(S) / ED DIAGNOSES  Final diagnoses:  Schizophrenia, unspecified type (HCC)  Schizoaffective disorder, unspecified type (HCC)      NEW MEDICATIONS STARTED DURING THIS VISIT:  New Prescriptions   No medications on file     Note:  This document was prepared using Dragon voice recognition software and may include unintentional dictation errors.     Merrily Brittle, MD 03/25/18 1610    Merrily Brittle, MD 03/25/18 [phone]

## 2018-03-24 NOTE — ED Triage Notes (Addendum)
Patient ambulatory to triage with steady gait, without difficulty or distress noted; pt accomp by [facility] PD for IVC; pt denies SI or HI; pt voices believe that "someone has put a root on her"

## 2018-03-24 NOTE — ED Notes (Signed)
EDT, Sarah in triage to change pt into behav scrubs; black pants, pink t-shirt, pink flipflops, pink bra, hair tie, orange rubber bracelet, LG cell phone with charger, and panties removed and placed in labeled pt belonging bag to be secured on nursing unit

## 2018-03-24 NOTE — ED Notes (Signed)
IVC/SOC ordered & called  

## 2018-03-24 NOTE — BH Assessment (Signed)
Assessment Note  Tracy Poole is an 22 y.o. female. Ms. Blow arrived to the ED by way of transportation by [facility] police.  She reports that her mother kept calling the police trying to me brainwashed and trying to get her to stay at home with her. She states that she and her mother were arguing.  She states that she left that and came to find out the woman next door was a part of it too.  She states that they were trying to change her and make her look different, change her voice. She reports that they tried to make her look like a man.  She denied symptoms of depression, but states "I have been going through things. She denied symptoms of anxiety.  She reports that she hears voices, she reports "Some be good, some be bad".  She reports seeing shadows and that they are putting "roots" on her.  She denied suicidal ideation or intent.  She denied homicidal ideation or intent.  She denied the use of alcohol or drugs.  She reports that she is stressed by trying to get her mind on the right track, be a good mother, and finding a job.  IVC paperwork reports, "[name] is bipolar and is not takin her medication. She is calling her mom a guy and is hearing voices as well. She has had a child in the last 5 weeks and her mother states that she is not acting like herself."  Diagnosis: Schizophrenia  Past Medical History:  Past Medical History:  Diagnosis Date  . Anxiety   . Depression     Past Surgical History:  Procedure Laterality Date  . CESAREAN SECTION    . EUSTACHIAN TUBE DILATION    . HERNIA REPAIR      Family History: No family history on file.  Social History:  reports that she has quit smoking. Her smoking use included cigarettes. She smoked 1.00 pack per day. She has never used smokeless tobacco. She reports that she does not drink alcohol or use drugs.  Additional Social History:  Alcohol / Drug Use History of alcohol / drug use?: No history of alcohol / drug abuse  CIWA:  CIWA-Ar BP: 129/78 Pulse Rate: 89 COWS:    Allergies: No Known Allergies  Home Medications:  (Not in a hospital admission)  OB/GYN Status:  Patient's last menstrual period was 03/05/2018 (approximate).  General Assessment Data Location of Assessment: [facility] ED TTS Assessment: In system Is this a Tele or Face-to-Face Assessment?: Face-to-Face Is this an Initial Assessment or a Re-assessment for this encounter?: Initial Assessment Marital status: Single Maiden name: Redinger Is patient pregnant?: No Pregnancy Status: No Living Arrangements: Parent Can pt return to current living arrangement?: Yes Admission Status: Involuntary Is patient capable of signing voluntary admission?: No Referral Source: Self/Family/Friend Insurance type: Medicaid  Medical Screening Exam [facility] Walk-in ONLY) Medical Exam completed: Yes  Crisis Care Plan Living Arrangements: Parent Legal Guardian: Other:(Self) Name of Psychiatrist: None Name of Therapist: None  Education Status Is patient currently in school?: No IEP information if applicable: none Is the patient employed, unemployed or receiving disability?: Unemployed  Risk to self with the past 6 months Suicidal Ideation: No Has patient been a risk to self within the past 6 months prior to admission? : No Suicidal Intent: No Has patient had any suicidal intent within the past 6 months prior to admission? : Yes Is patient at risk for suicide?: No Suicidal Plan?: No-Not Currently/Within Last 6 Months Has  patient had any suicidal plan within the past 6 months prior to admission? : No Access to Means: No What has been your use of drugs/alcohol within the last 12 months?: denied use Previous Attempts/Gestures: Yes How many times?: 2 Other Self Harm Risks: denied Triggers for Past Attempts: None known Intentional Self Injurious Behavior: None Family Suicide History: No Recent stressful life event(s): Financial Problems Persecutory  voices/beliefs?: Yes Depression: No Depression Symptoms: (denied) Substance abuse history and/or treatment for substance abuse?: No Suicide prevention information given to non-admitted patients: Not applicable  Risk to Others within the past 6 months Homicidal Ideation: No Does patient have any lifetime risk of violence toward others beyond the six months prior to admission? : No Thoughts of Harm to Others: No Current Homicidal Intent: No Current Homicidal Plan: No Access to Homicidal Means: No Identified Victim: None identified History of harm to others?: No Assessment of Violence: None Noted Violent Behavior Description: denied Does patient have access to weapons?: No Criminal Charges Pending?: No Does patient have a court date: No Is patient on probation?: No  Psychosis Hallucinations: Auditory, Visual Delusions: (Paranoid)  Mental Status Report Appearance/Hygiene: In scrubs Eye Contact: Poor Motor Activity: Unremarkable Speech: Soft, Slow Level of Consciousness: Quiet/awake Mood: Ambivalent Affect: Appropriate to circumstance Anxiety Level: None Thought Processes: Coherent Judgement: Partial Orientation: Appropriate for developmental age Obsessive Compulsive Thoughts/Behaviors: None  Cognitive Functioning Concentration: Poor Memory: Recent Intact Is patient IDD: No Is patient DD?: No Insight: Poor Impulse Control: Poor Appetite: Poor Have you had any weight changes? : No Change Sleep: Decreased Vegetative Symptoms: None  ADLScreening [facility] Assessment Services) Patient's cognitive ability adequate to safely complete daily activities?: Yes Patient able to express need for assistance with ADLs?: Yes Independently performs ADLs?: Yes (appropriate for developmental age)  Prior Inpatient Therapy Prior Inpatient Therapy: Yes Prior Therapy Dates: 2018 Prior Therapy Facilty/Provider(s): [facility] Reason for Treatment: Schiziophrenia, Bipolar Disorder  Prior  Outpatient Therapy Prior Outpatient Therapy: No Does patient have an ACCT team?: No Does patient have Intensive In-House Services?  : No Does patient have [facility] services? : No Does patient have P4CC services?: No  ADL Screening (condition at time of admission) Patient's cognitive ability adequate to safely complete daily activities?: Yes Is the patient deaf or have difficulty hearing?: No Does the patient have difficulty seeing, even when wearing glasses/contacts?: No Does the patient have difficulty concentrating, remembering, or making decisions?: No Patient able to express need for assistance with ADLs?: Yes Does the patient have difficulty dressing or bathing?: No Independently performs ADLs?: Yes (appropriate for developmental age) Does the patient have difficulty walking or climbing stairs?: No Weakness of Legs: None Weakness of Arms/Hands: None  Home Assistive Devices/Equipment Home Assistive Devices/Equipment: None    Abuse/Neglect Assessment (Assessment to be complete while patient is alone) Abuse/Neglect Assessment Can Be Completed: Unable to assess, patient is non-responsive or altered mental status(Patient stated that she has not been abused, "not yet, I probably will be abused")                Disposition:  Disposition Initial Assessment Completed for this Encounter: Yes  On Site Evaluation by:   Reviewed with Physician:    Justice Deeds 03/24/2018 10:42 PM

## 2018-03-24 NOTE — ED Notes (Signed)
Pt and family still sleeping in room with NAD noted.

## 2018-03-24 NOTE — ED Provider Notes (Addendum)
Signout from Dr. Lenard Lance in this 22 year old female who is presenting with depression but without any suicidal or homicidal ideation.  Plan is to follow-up with the tele-psych recommendations and likely discharge to home.  Physical Exam  BP 131/74 (BP Location: Left Arm)   Pulse 100   Temp 98.6 F (37 C) (Oral)   Resp 20   Ht  (1.626 m)   Wt 113.4 kg (250 lb)   LMP 03/05/2018 (Approximate)   SpO2 98%   Breastfeeding? No   BMI 42.91 kg/m   Physical Exam Patient without any complaints at this time.  Not complaining of any suicidal or homicidal ideation. ED Course/Procedures     Procedures  MDM  Patient evaluated by Dr. Chipper Herb who is recommending 50 mg of Zoloft for 3 to 4 days and then 100 mg daily thereafter.  Recommending outpatient follow-up.  Patient does not meet involuntary commitment criteria per psychiatric consultation.  I agree with this.  Patient understanding of the plan willing to comply but will be discharged at this time.  Patient says that she does not breast-feeding her newborn child.       Myrna Blazer, MD 03/24/18 7829    Myrna Blazer, MD 03/24/18 [phone]

## 2018-03-25 ENCOUNTER — Inpatient Hospital Stay
Admission: AD | Admit: 2018-03-25 | Discharge: 2018-03-28 | DRG: 885 | Disposition: A | Discharge status: Home / Self Care | Payer: No Typology Code available for payment source | Attending: Psychiatry | Admitting: Psychiatry

## 2018-03-25 ENCOUNTER — Other Ambulatory Visit: Payer: Self-pay

## 2018-03-25 DIAGNOSIS — F333 Major depressive disorder, recurrent, severe with psychotic symptoms: Principal | ICD-10-CM | POA: Diagnosis present

## 2018-03-25 DIAGNOSIS — Z9114 Patient's other noncompliance with medication regimen: Secondary | ICD-10-CM

## 2018-03-25 DIAGNOSIS — F29 Unspecified psychosis not due to a substance or known physiological condition: Secondary | ICD-10-CM

## 2018-03-25 DIAGNOSIS — F209 Schizophrenia, unspecified: Secondary | ICD-10-CM | POA: Diagnosis present

## 2018-03-25 DIAGNOSIS — F419 Anxiety disorder, unspecified: Secondary | ICD-10-CM | POA: Diagnosis present

## 2018-03-25 DIAGNOSIS — G4700 Insomnia, unspecified: Secondary | ICD-10-CM | POA: Diagnosis present

## 2018-03-25 DIAGNOSIS — Z9119 Patient's noncompliance with other medical treatment and regimen: Secondary | ICD-10-CM

## 2018-03-25 DIAGNOSIS — Z87891 Personal history of nicotine dependence: Secondary | ICD-10-CM | POA: Diagnosis not present

## 2018-03-25 DIAGNOSIS — F200 Paranoid schizophrenia: Secondary | ICD-10-CM | POA: Diagnosis present

## 2018-03-25 DIAGNOSIS — F323 Major depressive disorder, single episode, severe with psychotic features: Secondary | ICD-10-CM

## 2018-03-25 MED ORDER — OLANZAPINE 10 MG PO TABS: 10.0000 mg | ORAL_TABLET | Freq: Every day | ORAL | Status: DC

## 2018-03-25 NOTE — BH Assessment (Addendum)
Patient is to be admitted to [facility] by Dr. Toni Amend.  Attending Physician will be Dr. Toni Amend.   Patient has been assigned to room 311, by [facility] Charge Nurse Gwen.   Intake Paper Work has been signed and placed on patient chart.  ER staff is aware of the admission:  [facility]   Dr. Lenard Lance, ER MD   Chrissie Noa Patient's Nurse   Mertie Clause Patient Access. Can come at 5:30

## 2018-03-25 NOTE — ED Notes (Signed)
Patient calm and cooperative.Denies SI,HI and AVH.Waiting for psych evaluation.Safety maintained with 15 min checks and camera surveillance.

## 2018-03-25 NOTE — ED Provider Notes (Addendum)
-----------------------------------------   4:06 PM on 03/25/2018 -----------------------------------------  EKG reviewed and interpreted by myself shows normal sinus rhythm at 70 bpm with a narrow QRS, normal axis, normal intervals, no concerning ST changes.   Patient has been seen and evaluated by psychiatry they will be admitting to their service for further treatment once a bed becomes available.   Minna Antis, MD 03/25/18 1606    Minna Antis, MD 03/25/18 [phone]

## 2018-03-25 NOTE — ED Notes (Signed)

## 2018-03-25 NOTE — ED Notes (Signed)
BEHAVIORAL HEALTH ROUNDING  Patient sleeping: No.  Patient alert and oriented: yes  Behavior appropriate: Yes. ; If no, describe:  Nutrition and fluids offered: Yes  Toileting and hygiene offered: Yes  Sitter present: not applicable, Q 15 min safety rounds and observation.  Law enforcement present: Yes ODS  

## 2018-03-25 NOTE — ED Notes (Signed)
Hourly rounding reveals patient in room. Stable, in no acute distress. Q15 minute rounds and monitoring via Security Cameras to continue. 

## 2018-03-25 NOTE — ED Notes (Signed)
Pt. Transferred to BHU from ED to room 2 after screening for contraband. Report to include Situation, Background, Assessment and Recommendations from Shelba Flake RN. Pt. Oriented to unit including Q15 minute rounds as well as the security cameras for their protection. Patient is alert and oriented, warm and dry in no acute distress. Patient denies SI, HI, and AVH. Pt. Encouraged to let me know if needs arise.

## 2018-03-25 NOTE — ED Notes (Signed)
Hourly rounding reveals patient sleeping in room. No complaints, stable, in no acute distress. Q15 minute rounds and monitoring via Security Cameras to continue. 

## 2018-03-25 NOTE — Progress Notes (Signed)
Admission Note:  D: Pt appeared depressed  With  a flat affect.  Pt  denies SI / AVH at this time. Patient brought to hospital this  Am  By police  Auditory hallucination  {Paranoid behavior . Feels someone is trying to put a root  On her . Patient also voice of her mother and   Neighbor trying to turn her into a man .  Pt is redirectable and cooperative with assessment.      A: Pt admitted to unit per protocol, skin assessment and search done and no contraband found  With Diplomatic Services operational officer.  Pt  educated on therapeutic milieu rules. Pt was introduced to milieu by nursing staff.    R: Pt was receptive to education about the milieu .  15 min safety checks started. Clinical research associate offered support

## 2018-03-25 NOTE — Consult Note (Signed)
[facility] Consult   Reason for Consult: Consult for 22 year old woman with a past history of psychotic disorder brought in after mother called 60 and instigated petition Referring Physician: Corky Downs Patient Identification: Tracy Poole MRN:  [id number] Principal Diagnosis: Major depressive disorder, single episode, severe with psychotic features [facility]) Diagnosis:   Patient Active Problem List   Diagnosis Date Noted  . Noncompliance [Z91.19] 03/25/2018  . Pregnancy [Z34.90] 09/29/2017  . Pregnancy affected by fetal growth restriction [O36.5990] 09/05/2017  . Abnormal findings on antenatal screening [O28.9]   . Major depressive disorder, single episode, severe with psychotic features ([place]) [F32.3] 03/28/2017  . First trimester pregnancy [Z34.91] 03/27/2017  . Schizophrenia spectrum disorder with psychotic disorder type not yet determined ([place]) [F29] 03/27/2017    Total Time spent with patient: 1 hour  Subjective:   Tracy Poole is a 22 y.o. female patient admitted with "I am just going through some stuff".  HPI: Patient seen chart reviewed.  22 year old woman with a past history of hospitalization for psychotic disorder.  She came to the hospital early yesterday in the morning and was complaining of depression but was screened by the [facility] and released from the hospital.  Patient returned at night under commitment initiated by mother.  Paperwork reports that the patient has been increasingly paranoid blaming her mother for trying to Tracy Poole her or put a "root" on her.  Also blaming neighbors for this.  When she came in last night she was admitting to auditory hallucinations.  Patient has not been getting outpatient psychiatric follow-up.  She is vague with me about her sleep patterns noting that she had a baby a few months ago and does not get much sleep regularly.  Denies alcohol or drug abuse.  Patient is very guarded about her symptoms now and not forthcoming with any kind of  real history to me.  Medical history: No significant ongoing medical problems  Social history: Lives with her mother and has a 102-monthold baby at home.  Patient is not currently working or going to school.  Substance abuse history: None  Past Psychiatric History: Patient had a previous admission to the hospital here in May 2018 with what appeared to be new onset paranoid psychotic symptoms.  She was discharged with instructions to stay on Zyprexa and follow-up with outpatient treatment but discontinued her medicine.  She reappeared in the emergency room in September once again with psychotic symptoms.  Overall though has been noncompliant.  Denies any history of suicide attempts or violence.  Previous diagnoses either schizophreniform or psychotic depression  Risk to Self: Suicidal Ideation: No Suicidal Intent: No Is patient at risk for suicide?: No Suicidal Plan?: No-Not Currently/Within Last 6 Months Access to Means: No What has been your use of drugs/alcohol within the last 12 months?: denied use How many times?: 2 Other Self Harm Risks: denied Triggers for Past Attempts: None known Intentional Self Injurious Behavior: None Risk to Others: Homicidal Ideation: No Thoughts of Harm to Others: No Current Homicidal Intent: No Current Homicidal Plan: No Access to Homicidal Means: No Identified Victim: None identified History of harm to others?: No Assessment of Violence: None Noted Violent Behavior Description: denied Does patient have access to weapons?: No Criminal Charges Pending?: No Does patient have a court date: No Prior Inpatient Therapy: Prior Inpatient Therapy: Yes Prior Therapy Dates: 2018 [facility] Schiziophren[facility], Bipolar Disorder Prior Outpatient Therapy: Prior Outpatient Therapy: No Does patient have an ACCT team?: No  Does patient have Intensive In-House Services?  : No Does patient have [facility] services? :  No Does patient have P4CC services?: No  Past Medical History:  Past Medical History:  Diagnosis Date  . Anxiety   . Depression     Past Surgical History:  Procedure Laterality Date  . CESAREAN SECTION    . EUSTACHIAN TUBE DILATION    . HERNIA REPAIR     Family History: No family history on file. Family Psychiatric  History: None known Social History:  Social History   Substance and Sexual Activity  Alcohol Use No     Social History   Substance and Sexual Activity  Drug Use No    Social History   Socioeconomic History  . Marital status: Single    Spouse name: Not on file  . Number of children: Not on file  . Years of education: Not on file  . Highest education level: Not on file  Occupational History  . Not on file  Social Needs  . Financial resource strain: Not on file  . Food insecurity:    Worry: Not on file    Inability: Not on file  . Transportation needs:    Medical: Not on file    Non-medical: Not on file  Tobacco Use  . Smoking status: Former Smoker    Packs/day: 1.00    Types: Cigarettes  . Smokeless tobacco: Never Used  Substance and Sexual Activity  . Alcohol use: No  . Drug use: No  . Sexual activity: Yes    Birth control/protection: None  Lifestyle  . Physical activity:    Days per week: Not on file    Minutes per session: Not on file  . Stress: Not on file  Relationships  . Social connections:    Talks on phone: Not on file    Gets together: Not on file    Attends religious service: Not on file    Active member of club or organization: Not on file    Attends meetings of clubs or organizations: Not on file    Relationship status: Not on file  Other Topics Concern  . Not on file  Social History Narrative  . Not on file   Additional Social History:    Allergies:  No Known Allergies  Labs:  Results for orders placed or performed during the hospital encounter of 03/24/18 (from the past 48 hour(s))  Comprehensive metabolic panel      Status: Abnormal   Collection Time: 03/24/18  9:34 PM  Result Value Ref Range   Sodium 136 135 - 145 mmol/L   Potassium 2.8 (L) 3.5 - 5.1 mmol/L   Chloride 103 101 - 111 mmol/L   CO2 24 22 - 32 mmol/L   Glucose, Bld 95 65 - 99 mg/dL   BUN 6 6 - 20 mg/dL   Creatinine, Ser 0.76 0.44 - 1.00 mg/dL   Calcium 9.1 8.9 - 10.3 mg/dL   Total Protein 7.7 6.5 - 8.1 g/dL   Albumin 4.4 3.5 - 5.0 g/dL   AST 20 15 - 41 U/L   ALT 25 14 - 54 U/L   Alkaline Phosphatase 85 38 - 126 U/L   Total Bilirubin 1.7 (H) 0.3 - 1.2 mg/dL   GFR calc non Af Amer >60 >60 mL/min   GFR calc Af Amer >60 >60 mL/min    Comment: (NOTE) The eGFR has been calculated using the CKD EPI equation. This calculation has not been validated in all clinical  situations. eGFR's persistently <60 mL/min signify possible Chronic Kidney Disease.    Anion gap 9 5 - 15    Comment: Performed at [facility], [address] [place], [place] [address]  Ethanol     Status: None   Collection Time: 03/24/18  9:34 PM  Result Value Ref Range   Alcohol, Ethyl (B) <10 <10 mg/dL    Comment:        LOWEST DETECTABLE LIMIT FOR SERUM ALCOHOL IS 10 mg/dL FOR MEDICAL PURPOSES ONLY Performed at [facility], [address] [place], [place] [address]   Salicylate level     Status: None   Collection Time: 03/24/18  9:34 PM  Result Value Ref Range   Salicylate Lvl <4.3 2.8 - 30.0 mg/dL    Comment: Performed at [facility], [address] [place], [place] [address]  Acetaminophen level     Status: Abnormal   Collection Time: 03/24/18  9:34 PM  Result Value Ref Range   Acetaminophen (Tylenol), Serum <10 (L) 10 - 30 ug/mL    Comment:        THERAPEUTIC CONCENTRATIONS VARY SIGNIFICANTLY. A RANGE OF 10-30 ug/mL MAY BE AN EFFECTIVE CONCENTRATION FOR MANY PATIENTS. HOWEVER, SOME ARE BEST TREATED AT CONCENTRATIONS OUTSIDE THIS RANGE. ACETAMINOPHEN CONCENTRATIONS >150 ug/mL AT 4 HOURS AFTER INGESTION AND >50  ug/mL AT 12 HOURS AFTER INGESTION ARE OFTEN ASSOCIATED WITH TOXIC REACTIONS. Performed at [facility], [address] [place], [place] [address]   cbc     Status: Abnormal   Collection Time: 03/24/18  9:34 PM  Result Value Ref Range   WBC 12.2 (H) 3.6 - 11.0 K/uL   RBC 4.58 3.80 - 5.20 MIL/uL   Hemoglobin 13.2 12.0 - 16.0 g/dL   HCT 38.9 35.0 - 47.0 %   MCV 84.9 80.0 - 100.0 fL   MCH 28.8 26.0 - 34.0 pg   MCHC 34.0 32.0 - 36.0 g/dL   RDW 14.0 11.5 - 14.5 %   Platelets 180 150 - 440 K/uL    Comment: Performed at [facility], [address] [place], [place] [address]  Urine Drug Screen, Qualitative     Status: None   Collection Time: 03/24/18  9:34 PM  Result Value Ref Range   Tricyclic, Ur Screen NONE DETECTED NONE DETECTED   Amphetamines, Ur Screen NONE DETECTED NONE DETECTED   MDMA (Ecstasy)Ur Screen NONE DETECTED NONE DETECTED   Cocaine Metabolite,Ur [place] NONE DETECTED NONE DETECTED   Opiate, Ur Screen NONE DETECTED NONE DETECTED   Phencyclidine (PCP) Ur S NONE DETECTED NONE DETECTED   Cannabinoid 50 Ng, Ur [place] NONE DETECTED NONE DETECTED   Barbiturates, Ur Screen NONE DETECTED NONE DETECTED   Benzodiazepine, Ur Scrn NONE DETECTED NONE DETECTED   Methadone Scn, Ur NONE DETECTED NONE DETECTED    Comment: (NOTE) Tricyclics + metabolites, urine    Cutoff 1000 ng/mL Amphetamines + metabolites, urine  Cutoff 1000 ng/mL MDMA (Ecstasy), urine              Cutoff 500 ng/mL Cocaine Metabolite, urine          Cutoff 300 ng/mL Opiate + metabolites, urine        Cutoff 300 ng/mL Phencyclidine (PCP), urine         Cutoff 25 ng/mL Cannabinoid, urine                 Cutoff 50 ng/mL Barbiturates + metabolites, urine  Cutoff 200 ng/mL Benzodiazepine, urine  Cutoff 200 ng/mL Methadone, urine                   Cutoff 300 ng/mL The urine drug screen provides only a preliminary, unconfirmed analytical test result and should not be used for  non-medical purposes. Clinical consideration and professional judgment should be applied to any positive drug screen result due to possible interfering substances. A more specific alternate chemical method must be used in order to obtain a confirmed analytical result. Gas chromatography / mass spectrometry (GC/MS) is the preferred confirmat ory method. Performed at [facility], [address] [place], [place] [address]   Pregnancy, urine POC     Status: None   Collection Time: 03/24/18  9:43 PM  Result Value Ref Range   Preg Test, Ur NEGATIVE NEGATIVE    Comment:        THE SENSITIVITY OF THIS METHODOLOGY IS >24 mIU/mL     Current Facility-Administered Medications  Medication Dose Route Frequency Provider Last Rate Last Dose  . OLANZapine (ZYPREXA) tablet 10 mg  10 mg Oral QHS Kendra Grissett, [name], MD       Current Outpatient Medications  Medication Sig Dispense Refill  . folic acid (FOLVITE) 1 MG tablet Take 2 tablets (2 mg total) by mouth daily. (Patient not taking: Reported on 08/09/2017) 60 tablet 1  . OLANZapine (ZYPREXA) 5 MG tablet Take 1 tablet (5 mg total) by mouth at bedtime. (Patient not taking: Reported on 09/29/2017) 10 tablet 0  . prenatal vitamin w/FE, FA (PRENATAL 1 + 1) 27-1 MG TABS tablet Take 1 tablet by mouth daily at 12 noon. 30 each 1  . sertraline (ZOLOFT) 50 MG tablet Take 1 tab, PO daily for 4 days then 2 tabs PO daily thereafter 34 tablet 0    Musculoskeletal: Strength & Muscle Tone: within normal limits Gait & Station: normal Patient leans: N/A  Psychiatric Specialty Exam: Physical Exam  Nursing note and vitals reviewed. Constitutional: She appears well-developed and well-nourished.  HENT:  Head: Normocephalic and atraumatic.  Eyes: Pupils are equal, round, and reactive to light. Conjunctivae are normal.  Neck: Normal range of motion.  Cardiovascular: Regular rhythm and normal heart sounds.  Respiratory: Effort normal. No respiratory  distress.  GI: Soft.  Musculoskeletal: Normal range of motion.  Neurological: She is alert.  Skin: Skin is warm and dry.  Psychiatric: Her mood appears anxious. Her speech is delayed. She is slowed and withdrawn. Thought content is paranoid. Cognition and memory are impaired. She expresses inappropriate judgment. She does not express impulsivity. She expresses no homicidal and no suicidal ideation.    Review of Systems  Constitutional: Negative.   HENT: Negative.   Eyes: Negative.   Respiratory: Negative.   Cardiovascular: Negative.   Gastrointestinal: Negative.   Musculoskeletal: Negative.   Skin: Negative.   Neurological: Negative.   Psychiatric/Behavioral: Negative for depression, hallucinations, memory loss, substance abuse and suicidal ideas. The patient is nervous/anxious. The patient does not have insomnia.     Blood pressure (!) 175/73, pulse 92, temperature 98.9 F (37.2 C), temperature source Oral, resp. rate 17, height 5' 4"  (1.626 m), weight 113.4 kg (250 lb), last menstrual period 03/05/2018, SpO2 100 %.Body mass index is 42.91 kg/m.  General Appearance: Casual  Eye Contact:  Minimal  Speech:  Slow  Volume:  Decreased  Mood:  Euthymic  Affect:  Constricted  Thought Process:  Disorganized  Orientation:  Full (Time, Place, and Person)  Thought Content:  Illogical  Suicidal Thoughts:  No  Homicidal Thoughts:  No  Memory:  Immediate;   Fair Recent;   Fair Remote;   Fair  Judgement:  Impaired  Insight:  Lacking  Psychomotor Activity:  Decreased  Concentration:  Concentration: Fair  Recall:  [employer] of Knowledge:  Fair  Language:  Fair  Akathisia:  No  Handed:  Right  AIMS (if indicated):     Assets:  Housing Physical Health  ADL's:  Intact  Cognition:  Impaired,  Mild  Sleep:        Treatment Plan Summary: Daily contact with patient to assess and evaluate symptoms and progress in treatment, Medication management and Plan Patient presents as very  guarded and withdrawn.  Keeps a blanket covering her mouth throughout the interview.  Clearly still having evidence of feeling paranoid.  Patient would benefit from hospitalization.  Plan to admit her to the psychiatric unit downstairs restart olanzapine.  Of labs 15-minute checks.  Disposition: Recommend psychiatric Inpatient admission when medically cleared. Supportive therapy provided about ongoing stressors.  Alethia Berthold, MD 03/25/2018 3:26 PM

## 2018-03-25 NOTE — ED Notes (Signed)
Pt oob in dayroom asking for phone. Pt calm and co-operative.

## 2018-03-25 NOTE — Plan of Care (Signed)
New admission  has not started programing  Problem: Education: Goal: Knowledge of [facility] Education information/materials will improve Outcome: Not Progressing Goal: Emotional status will improve Outcome: Not Progressing Goal: Mental status will improve Outcome: Not Progressing Goal: Verbalization of understanding the information provided will improve Outcome: Not Progressing

## 2018-03-25 NOTE — ED Notes (Signed)
BEHAVIORAL HEALTH ROUNDING Patient sleeping: Yes.   Patient alert and oriented: not applicable SLEEPING Behavior appropriate: Yes.  ; If no, describe: SLEEPING Nutrition and fluids offered: No SLEEPING Toileting and hygiene offered: NoSLEEPING Sitter present: not applicable, Q 15 min safety rounds and observation. Law enforcement present: Yes ODS 

## 2018-03-25 NOTE — ED Notes (Signed)
PT IVC PENDING PSYCH CONSULT. 

## 2018-03-26 DIAGNOSIS — F333 Major depressive disorder, recurrent, severe with psychotic symptoms: Secondary | ICD-10-CM | POA: Insufficient documentation

## 2018-03-26 DIAGNOSIS — F29 Unspecified psychosis not due to a substance or known physiological condition: Secondary | ICD-10-CM

## 2018-03-26 MED ORDER — TRAZODONE HCL 100 MG PO TABS
100.0000 mg | ORAL_TABLET | Freq: Every day | ORAL | Status: DC
  Administered 2018-03-27: 100 mg via ORAL
  Filled 2018-03-26 (×2): qty 1

## 2018-03-26 MED ORDER — OLANZAPINE 10 MG PO TABS
10.0000 mg | ORAL_TABLET | Freq: Every day | ORAL | Status: DC
  Administered 2018-03-26: 10 mg via ORAL
  Filled 2018-03-26: qty 1

## 2018-03-26 MED ORDER — ACETAMINOPHEN 325 MG PO TABS: 650.0000 mg | ORAL_TABLET | Freq: Four times a day (QID) | ORAL | Status: DC | PRN

## 2018-03-26 MED ORDER — PALIPERIDONE ER 3 MG PO TB24
3.0000 mg | ORAL_TABLET | Freq: Every day | ORAL | Status: DC
  Filled 2018-03-26: qty 1

## 2018-03-26 MED ORDER — ALUM & MAG HYDROXIDE-SIMETH 200-200-20 MG/5ML PO SUSP: 30.0000 mL | ORAL | Status: DC | PRN

## 2018-03-26 MED ORDER — MAGNESIUM HYDROXIDE 400 MG/5ML PO SUSP: 30.0000 mL | Freq: Every day | ORAL | Status: DC | PRN

## 2018-03-26 NOTE — [facility] Suicide Risk Assessment (Signed)
[facility] INPATIENT:  Family/Significant Other Suicide Prevention Education  Suicide Prevention Education:  Education Completed; Luanna Salk, Mother, [phone]  has been identified by the patient as the family member/significant other with whom the patient will be residing, and identified as the person(s) who will aid the patient in the event of a mental health crisis (suicidal ideations/suicide attempt).  With written consent from the patient, the family member/significant other has been provided the following suicide prevention education, prior to the and/or following the discharge of the patient.  The suicide prevention education provided includes the following:  Suicide risk factors  Suicide prevention and interventions  National Suicide Hotline telephone number  [facility] assessment telephone number  [facility] Emergency Assistance 911  [facility] and/or Residential Mobile Crisis Unit telephone number  Request made of family/significant other to:  Remove weapons (e.g., guns, rifles, knives), all items previously/currently identified as safety concern.    Remove drugs/medications (over-the-counter, prescriptions, illicit drugs), all items previously/currently identified as a safety concern.  The family member/significant other verbalizes understanding of the suicide prevention education information provided.  The family member/significant other agrees to remove the items of safety concern listed above.  Pts. Mother reports that the patient was afraid of sleeping in her room and being paranoid. Pt.s mother reports that she thinks it may be postpartum. She reports that the patient was taking Sertraline. Mother inquired about getting the patient on a shot for her mental health. Mother seems supportive and wants the patient to get better. Mother reports that there are no guns/weapons in the home. No other concerns reported. All questions answered.   Johny Shears 03/26/2018, 9:31 AM

## 2018-03-26 NOTE — Progress Notes (Signed)
D:Patient attended  Treatment  Team  Upset that she couldn't  Go home today. Noted very tearful. Limited interaction  With peer and staff . Patient  Aware of information given to patient  concerning [place] . Patient remains  emotional and mentally  altered. Information given in concrete form  for better understanding . Patient understanding  the importance of rest periods during shift. Introduced to Pharmacologist.Encourage to attend groups for better understanding of feelings   A: Encourage patient participation with unit programming . Instruction  Given on  Medication , verbalize understanding.  R: Voice no other concerns. Staff continue to monitor

## 2018-03-26 NOTE — Tx Team (Addendum)
Interdisciplinary Treatment and Diagnostic Plan Update  03/26/2018 Time of Session: 2:15PM ANABIA WEATHERWAX MRN: [id number]  Principal Diagnosis: <principal problem not specified>  Secondary Diagnoses: Active Problems:   Severe recurrent major depression with psychotic features (HCC)   Current Medications:  Current Facility-Administered Medications  Medication Dose Route Frequency Provider Last Rate Last Dose  . acetaminophen (TYLENOL) tablet 650 mg  650 mg Oral Q6H PRN Clapacs, John T, MD      . alum & mag hydroxide-simeth (MAALOX/MYLANTA) 200-200-20 MG/5ML suspension 30 mL  30 mL Oral Q4H PRN Clapacs, John T, MD      . magnesium hydroxide (MILK OF MAGNESIA) suspension 30 mL  30 mL Oral Daily PRN Clapacs, John T, MD      . OLANZapine (ZYPREXA) tablet 10 mg  10 mg Oral QHS Clapacs, John T, MD       PTA Medications: Medications Prior to Admission  Medication Sig Dispense Refill Last Dose  . folic acid (FOLVITE) 1 MG tablet Take 2 tablets (2 mg total) by mouth daily. (Patient not taking: Reported on 08/09/2017) 60 tablet 1 Not Taking  . OLANZapine (ZYPREXA) 5 MG tablet Take 1 tablet (5 mg total) by mouth at bedtime. (Patient not taking: Reported on 09/29/2017) 10 tablet 0 Not Taking at Unknown time  . prenatal vitamin w/FE, FA (PRENATAL 1 + 1) 27-1 MG TABS tablet Take 1 tablet by mouth daily at 12 noon. 30 each 1 Past Month at Unknown time  . sertraline (ZOLOFT) 50 MG tablet Take 1 tab, PO daily for 4 days then 2 tabs PO daily thereafter 34 tablet 0     Patient Stressors: Financial difficulties Medication change or noncompliance Substance abuse  Patient Strengths: Ability for insight Capable of independent living Communication skills Supportive family/friends  Treatment Modalities: Medication Management, Group therapy, Case management,  1 to 1 session with clinician, Psychoeducation, Recreational therapy.   Physician Treatment Plan for Primary Diagnosis: <principal problem not  specified> Long Term Goal(s):     Short Term Goals:    Medication Management: Evaluate patient's response, side effects, and tolerance of medication regimen.  Therapeutic Interventions: 1 to 1 sessions, Unit Group sessions and Medication administration.  Evaluation of Outcomes: Progressing  Physician Treatment Plan for Secondary Diagnosis: Active Problems:   Severe recurrent major depression with psychotic features (HCC)  Long Term Goal(s):     Short Term Goals:       Medication Management: Evaluate patient's response, side effects, and tolerance of medication regimen.  Therapeutic Interventions: 1 to 1 sessions, Unit Group sessions and Medication administration.  Evaluation of Outcomes: Progressing   RN Treatment Plan for Primary Diagnosis: <principal problem not specified> Long Term Goal(s): Knowledge of disease and therapeutic regimen to maintain health will improve  Short Term Goals: Ability to verbalize feelings will improve, Ability to identify and develop effective coping behaviors will improve and Compliance with prescribed medications will improve  Medication Management: RN will administer medications as ordered by provider, will assess and evaluate patient's response and provide education to patient for prescribed medication. RN will report any adverse and/or side effects to prescribing provider.  Therapeutic Interventions: 1 on 1 counseling sessions, Psychoeducation, Medication administration, Evaluate responses to treatment, Monitor vital signs and CBGs as ordered, Perform/monitor CIWA, COWS, AIMS and Fall Risk screenings as ordered, Perform wound care treatments as ordered.  Evaluation of Outcomes: Progressing   LCSW Treatment Plan for Primary Diagnosis: <principal problem not specified> Long Term Goal(s): Safe transition to appropriate next level of  care at discharge, Engage patient in therapeutic group addressing interpersonal concerns.  Short Term Goals: Engage  patient in aftercare planning with referrals and resources, Increase emotional regulation, Facilitate acceptance of mental health diagnosis and concerns, Identify triggers associated with mental health/substance abuse issues and Increase skills for wellness and recovery  Therapeutic Interventions: Assess for all discharge needs, 1 to 1 time with Social worker, Explore available resources and support systems, Assess for adequacy in community support network, Educate family and significant other(s) on suicide prevention, Complete Psychosocial Assessment, Interpersonal group therapy.  Evaluation of Outcomes: Progressing   Progress in Treatment: Attending groups: No. Participating in groups: No. Taking medication as prescribed: Yes. Toleration medication: Yes. Family/Significant other contact made: Yes, individual(s) contacted:  Luanna Salk, Mother, [phone]  Patient understands diagnosis: Yes. Discussing patient identified problems/goals with staff: Yes. Medical problems stabilized or resolved: Yes. Denies suicidal/homicidal ideation: Yes. Issues/concerns per patient self-inventory: No. Other:   New problem(s) identified: No, Describe:  None  New Short Term/Long Term Goal(s): "I want to go home and be with my baby."  Discharge Plan or Barriers: To return home and follow up with outpatient treatment at [facility].  Reason for Continuation of Hospitalization: Depression Medication stabilization  Estimated Length of Stay: 2 days  Recreational Therapy: Patient Stressors: Family, Work  Patient Goal: Patient will successfully identify 2 ways of making healthy decisions post d/c within 5 recreation therapy group sessions  Attendees: Patient: Cidney Kirkwood 03/26/2018 2:50 PM  Physician: Mordecai Rasmussen, MD 03/26/2018 2:50 PM  Nursing: Hulan Amato, RN 03/26/2018 2:50 PM  RN Care Manager: 03/26/2018 2:50 PM  Social Worker: Johny Shears, LCSWA 03/26/2018 2:50 PM  Recreational Therapist: Danella Deis.  Forrester Blando CTRS, LRT 03/26/2018 2:50 PM  Other: Huey Romans, LCSW 03/26/2018 2:50 PM  Other:  03/26/2018 2:50 PM  Other: 03/26/2018 2:50 PM    Scribe for Treatment Team: Johny Shears, LCSW 03/26/2018 2:50 PM

## 2018-03-26 NOTE — BHH Suicide Risk Assessment (Signed)
[facility] Admission Suicide Risk Assessment   Nursing information obtained from:  Patient Demographic factors:  Low socioeconomic status, Unemployed, Adolescent or young adult Current Mental Status:  NA Loss Factors:  NA Historical Factors:  NA Risk Reduction Factors:  Pregnancy, Religious beliefs about death, Sense of responsibility to family, Responsible for children under 22 years of age(Just had baby)  Total Time spent with patient: 1 hour Principal Problem: <principal problem not specified> Diagnosis:   Patient Active Problem List   Diagnosis Date Noted  . Severe recurrent major depression with psychotic features ([place]) [F33.3] 03/26/2018  . Noncompliance [Z91.19] 03/25/2018  . Pregnancy [Z34.90] 09/29/2017  . Pregnancy affected by fetal growth restriction [O36.5990] 09/05/2017  . Abnormal findings on antenatal screening [O28.9]   . Major depressive disorder, single episode, severe with psychotic features ([place]) [F32.3] 03/28/2017  . First trimester pregnancy [Z34.91] 03/27/2017  . Schizophrenia spectrum disorder with psychotic disorder type not yet determined [facility]) [F29] 03/27/2017   Subjective Data: Patient admitted through the emergency room with return of paranoia agitation bizarre behavior but no suicidality.  Continued Clinical Symptoms:  Alcohol Use Disorder Identification Test Final Score (AUDIT): 2 The "Alcohol Use Disorders Identification Test", Guidelines for Use in Primary Care, Second Edition.  World Pharmacologist [facility]). Score between 0-7:  no or low risk or alcohol related problems. Score between 8-15:  moderate risk of alcohol related problems. Score between 16-19:  high risk of alcohol related problems. Score 20 or above:  warrants further diagnostic evaluation for alcohol dependence and treatment.   CLINICAL FACTORS:   Schizophrenia:   Paranoid or undifferentiated type   Musculoskeletal: Strength & Muscle Tone: within normal limits Gait & Station:  normal Patient leans: N/A  Psychiatric Specialty Exam: Physical Exam  Nursing note and vitals reviewed. Constitutional: She appears well-developed and well-nourished.  HENT:  Head: Normocephalic and atraumatic.  Eyes: Pupils are equal, round, and reactive to light. Conjunctivae are normal.  Neck: Normal range of motion.  Cardiovascular: Regular rhythm and normal heart sounds.  Respiratory: Effort normal. No respiratory distress.  GI: Soft.  Musculoskeletal: Normal range of motion.  Neurological: She is alert.  Skin: Skin is warm and dry.  Psychiatric: Her affect is blunt. Her speech is delayed. She is slowed. Cognition and memory are impaired. She expresses impulsivity. She expresses no homicidal and no suicidal ideation.    Review of Systems  Constitutional: Negative.   HENT: Negative.   Eyes: Negative.   Respiratory: Negative.   Cardiovascular: Negative.   Gastrointestinal: Negative.   Musculoskeletal: Negative.   Skin: Negative.   Neurological: Negative.   Psychiatric/Behavioral: Negative for depression, hallucinations, memory loss, substance abuse and suicidal ideas. The patient is nervous/anxious and has insomnia.     Blood pressure (!) 141/82, pulse 96, temperature 98.7 F (37.1 C), temperature source Oral, resp. rate 18, height 5' 4"  (1.626 m), weight 102.1 kg (225 lb), last menstrual period 03/05/2018, SpO2 (!) 86 %.Body mass index is 38.62 kg/m.  General Appearance: Fairly Groomed  Eye Contact:  Minimal  Speech:  Slow  Volume:  Decreased  Mood:  Anxious  Affect:  Congruent  Thought Process:  Goal Directed  Orientation:  Full (Time, Place, and Person)  Thought Content:  Logical and Paranoid Ideation  Suicidal Thoughts:  No  Homicidal Thoughts:  No  Memory:  Immediate;   Fair Recent;   Fair Remote;   Fair  Judgement:  Impaired  Insight:  Shallow  Psychomotor Activity:  Decreased  Concentration:  Concentration: Fair  Recall:  Smiley Houseman of Knowledge:  Fair   Language:  Fair  Akathisia:  No  Handed:  Right  AIMS (if indicated):     Assets:  Desire for Improvement Housing Physical Health Resilience Social Support  ADL's:  Intact  Cognition:  Impaired,  Mild  Sleep:         COGNITIVE FEATURES THAT CONTRIBUTE TO RISK:  Loss of executive function    SUICIDE RISK:   Minimal: No identifiable suicidal ideation.  Patients presenting with no risk factors but with morbid ruminations; may be classified as minimal risk based on the severity of the depressive symptoms  PLAN OF CARE : Patient admitted to the psychiatric unit.  15-minute checks in place.  Met with the patient and her mother tonight to discuss medication management.  Patient will be started on antipsychotic medicine.  Treatment team met today.  Labs will be monitored.  We will work towards discharge planning at our earliest opportunity.  I certify that inpatient services furnished can reasonably be expected to improve the patient's condition.   Alethia Berthold, MD 03/26/2018, 8:50 PM

## 2018-03-26 NOTE — Progress Notes (Signed)
Patient slept 6.15 hours. No issues.

## 2018-03-26 NOTE — Progress Notes (Signed)
Patient denies any acute pain at  this shift. Also denies any SI/AVH as well. Patient requested for a room changed. Patient refused to give reason for her request. Resting quietly at this time with her eyes closed, respirations even and unlabored. Will continue to monitor every15 minutes.

## 2018-03-26 NOTE — H&P (Signed)
Psychiatric Admission Assessment Adult  Patient Identification: Tracy Poole MRN:  [id number] Date of Evaluation:  03/26/2018 Chief Complaint:  Major Depression disorder Principal Diagnosis: Schizophrenia spectrum disorder with psychotic disorder type not yet determined [facility]) Diagnosis:   Patient Active Problem List   Diagnosis Date Noted  . Severe recurrent major depression with psychotic features ([place]) [F33.3] 03/26/2018  . Noncompliance [Z91.19] 03/25/2018  . Pregnancy [Z34.90] 09/29/2017  . Pregnancy affected by fetal growth restriction [O36.5990] 09/05/2017  . Abnormal findings on antenatal screening [O28.9]   . Major depressive disorder, single episode, severe with psychotic features ([place]) [F32.3] 03/28/2017  . First trimester pregnancy [Z34.91] 03/27/2017  . Schizophrenia spectrum disorder with psychotic disorder type not yet determined [facility]) [F29] 03/27/2017   History of Present Illness: 22 year old woman presented to the hospital referred by her family because of return of paranoia confusion agitation and disorganized behavior with some sign of hostility at home.  Patient was withdrawn paranoid and had poor insight in the emergency room.  Has not been accepting treatment.  No sign of any actual violent dangerous or suicidal behavior. Associated Signs/Symptoms: Depression Symptoms:  anhedonia, insomnia, (Hypo) Manic Symptoms:  Distractibility, Anxiety Symptoms:  Social Anxiety, Psychotic Symptoms:  Paranoia, PTSD Symptoms: Negative Total Time spent with patient: 1 hour  Past Psychiatric History: Past history of hospitalization for paranoia and psychosis before her most recent pregnancy which resulted in improvement on antipsychotic medicine.  Is the patient at risk to self? No.  Has the patient been a risk to self in the past 6 months? No.  Has the patient been a risk to self within the distant past? No.  Is the patient a risk to others? No.  Has the patient been a risk to  others in the past 6 months? No.  Has the patient been a risk to others within the distant past? No.   Prior Inpatient Therapy:   Prior Outpatient Therapy:    Alcohol Screening: 1. How often do you have a drink containing alcohol?: Monthly or less 2. How many drinks containing alcohol do you have on a typical day when you are drinking?: 1 or 2 3. How often do you have six or more drinks on one occasion?: Less than monthly AUDIT-C Score: 2 4. How often during the last year have you found that you were not able to stop drinking once you had started?: Never 5. How often during the last year have you failed to do what was normally expected from you becasue of drinking?: Never 6. How often during the last year have you needed a first drink in the morning to get yourself going after a heavy drinking session?: Never 7. How often during the last year have you had a feeling of guilt of remorse after drinking?: Never 8. How often during the last year have you been unable to remember what happened the night before because you had been drinking?: Never 9. Have you or someone else been injured as a result of your drinking?: No 10. Has a relative or friend or a doctor or another health worker been concerned about your drinking or suggested you cut down?: No Alcohol Use Disorder Identification Test Final Score (AUDIT): 2 Intervention/Follow-up: AUDIT Score <7 follow-up not indicated Substance Abuse History in the last 12 months:  No. Consequences of Substance Abuse: Negative Previous Psychotropic Medications: No  Psychological Evaluations: Yes  Past Medical History:  Past Medical History:  Diagnosis Date  . Anxiety   . Depression  Past Surgical History:  Procedure Laterality Date  . CESAREAN SECTION    . EUSTACHIAN TUBE DILATION    . HERNIA REPAIR     Family History: History reviewed. No pertinent family history. Family Psychiatric  History: None Tobacco Screening: Have you used any form of  tobacco in the last 30 days? (Cigarettes, Smokeless Tobacco, Cigars, and/or Pipes): Yes Tobacco use, Select all that apply: 5 or more cigarettes per day Are you interested in Tobacco Cessation Medications?: Yes, will notify MD for an order Counseled patient on smoking cessation including recognizing danger situations, developing coping skills and basic information about quitting provided: Refused/Declined practical counseling Social History:  Social History   Substance and Sexual Activity  Alcohol Use No     Social History   Substance and Sexual Activity  Drug Use No    Additional Social History:                           Allergies:  No Known Allergies Lab Results:  Results for orders placed or performed during the hospital encounter of 03/24/18 (from the past 48 hour(s))  Comprehensive metabolic panel     Status: Abnormal   Collection Time: 03/24/18  9:34 PM  Result Value Ref Range   Sodium 136 135 - 145 mmol/L   Potassium 2.8 (L) 3.5 - 5.1 mmol/L   Chloride 103 101 - 111 mmol/L   CO2 24 22 - 32 mmol/L   Glucose, Bld 95 65 - 99 mg/dL   BUN 6 6 - 20 mg/dL   Creatinine, Ser 0.76 0.44 - 1.00 mg/dL   Calcium 9.1 8.9 - 10.3 mg/dL   Total Protein 7.7 6.5 - 8.1 g/dL   Albumin 4.4 3.5 - 5.0 g/dL   AST 20 15 - 41 U/L   ALT 25 14 - 54 U/L   Alkaline Phosphatase 85 38 - 126 U/L   Total Bilirubin 1.7 (H) 0.3 - 1.2 mg/dL   GFR calc non Af Amer >60 >60 mL/min   GFR calc Af Amer >60 >60 mL/min    Comment: (NOTE) The eGFR has been calculated using the CKD EPI equation. This calculation has not been validated in all clinical situations. eGFR's persistently <60 mL/min signify possible Chronic Kidney Disease.    Anion gap 9 5 - 15    Comment: Performed at [facility], [address] [place], [place] [address]  Ethanol     Status: None   Collection Time: 03/24/18  9:34 PM  Result Value Ref Range   Alcohol, Ethyl (B) <10 <10 mg/dL    Comment:        LOWEST  DETECTABLE LIMIT FOR SERUM ALCOHOL IS 10 mg/dL FOR MEDICAL PURPOSES ONLY Performed at [facility], [address] [place], [place] [address]   Salicylate level     Status: None   Collection Time: 03/24/18  9:34 PM  Result Value Ref Range   Salicylate Lvl <2.4 2.8 - 30.0 mg/dL    Comment: Performed at [facility], [address] [place], [place] [address]  Acetaminophen level     Status: Abnormal   Collection Time: 03/24/18  9:34 PM  Result Value Ref Range   Acetaminophen (Tylenol), Serum <10 (L) 10 - 30 ug/mL    Comment:        THERAPEUTIC CONCENTRATIONS VARY SIGNIFICANTLY. A RANGE OF 10-30 ug/mL MAY BE AN EFFECTIVE CONCENTRATION FOR MANY PATIENTS. HOWEVER, SOME ARE BEST TREATED AT CONCENTRATIONS  OUTSIDE THIS RANGE. ACETAMINOPHEN CONCENTRATIONS >150 ug/mL AT 4 HOURS AFTER INGESTION AND >50 ug/mL AT 12 HOURS AFTER INGESTION ARE OFTEN ASSOCIATED WITH TOXIC REACTIONS. Performed at [facility], [address] [place], [place] [address]   cbc     Status: Abnormal   Collection Time: 03/24/18  9:34 PM  Result Value Ref Range   WBC 12.2 (H) 3.6 - 11.0 K/uL   RBC 4.58 3.80 - 5.20 MIL/uL   Hemoglobin 13.2 12.0 - 16.0 g/dL   HCT 38.9 35.0 - 47.0 %   MCV 84.9 80.0 - 100.0 fL   MCH 28.8 26.0 - 34.0 pg   MCHC 34.0 32.0 - 36.0 g/dL   RDW 14.0 11.5 - 14.5 %   Platelets 180 150 - 440 K/uL    Comment: Performed at [facility], [address], [place], [place] [address]  Urine Drug Screen, Qualitative     Status: None   Collection Time: 03/24/18  9:34 PM  Result Value Ref Range   Tricyclic, Ur Screen NONE DETECTED NONE DETECTED   Amphetamines, Ur Screen NONE DETECTED NONE DETECTED   MDMA (Ecstasy)Ur Screen NONE DETECTED NONE DETECTED   Cocaine Metabolite,Ur [place] NONE DETECTED NONE DETECTED   Opiate, Ur Screen NONE DETECTED NONE DETECTED   Phencyclidine (PCP) Ur S NONE DETECTED NONE DETECTED   Cannabinoid 50 Ng, Ur [place] NONE DETECTED NONE  DETECTED   Barbiturates, Ur Screen NONE DETECTED NONE DETECTED   Benzodiazepine, Ur Scrn NONE DETECTED NONE DETECTED   Methadone Scn, Ur NONE DETECTED NONE DETECTED    Comment: (NOTE) Tricyclics + metabolites, urine    Cutoff 1000 ng/mL Amphetamines + metabolites, urine  Cutoff 1000 ng/mL MDMA (Ecstasy), urine              Cutoff 500 ng/mL Cocaine Metabolite, urine          Cutoff 300 ng/mL Opiate + metabolites, urine        Cutoff 300 ng/mL Phencyclidine (PCP), urine         Cutoff 25 ng/mL Cannabinoid, urine                 Cutoff 50 ng/mL Barbiturates + metabolites, urine  Cutoff 200 ng/mL Benzodiazepine, urine              Cutoff 200 ng/mL Methadone, urine                   Cutoff 300 ng/mL The urine drug screen provides only a preliminary, unconfirmed analytical test result and should not be used for non-medical purposes. Clinical consideration and professional judgment should be applied to any positive drug screen result due to possible interfering substances. A more specific alternate chemical method must be used in order to obtain a confirmed analytical result. Gas chromatography / mass spectrometry (GC/MS) is the preferred confirmat ory method. Performed at [facility], [address] [place], [place] [address]   Pregnancy, urine POC     Status: None   Collection Time: 03/24/18  9:43 PM  Result Value Ref Range   Preg Test, Ur NEGATIVE NEGATIVE    Comment:        THE SENSITIVITY OF THIS METHODOLOGY IS >24 mIU/mL     Blood Alcohol level:  Lab Results  Component Value Date   ETH <10 03/24/2018   ETH <5 77/93/9030    Metabolic Disorder Labs:  Lab Results  Component Value Date   HGBA1C 5.1 03/28/2017   MPG 100 03/28/2017  Lab Results  Component Value Date   PROLACTIN 53.4 (H) 03/28/2017   Lab Results  Component Value Date   CHOL 120 03/28/2017   TRIG 24 03/28/2017   HDL 38 (L) 03/28/2017   CHOLHDL 3.2 03/28/2017   VLDL 5 03/28/2017    LDLCALC 77 03/28/2017    Current Medications: Current Facility-Administered Medications  Medication Dose Route Frequency Provider Last Rate Last Dose  . acetaminophen (TYLENOL) tablet 650 mg  650 mg Oral Q6H PRN Brent Noto T, MD      . alum & mag hydroxide-simeth (MAALOX/MYLANTA) 200-200-20 MG/5ML suspension 30 mL  30 mL Oral Q4H PRN Italo Banton T, MD      . magnesium hydroxide (MILK OF MAGNESIA) suspension 30 mL  30 mL Oral Daily PRN Rovena Hearld T, MD      . paliperidone (INVEGA) 24 hr tablet 3 mg  3 mg Oral QHS Chelisa Hennen T, MD       PTA Medications: Medications Prior to Admission  Medication Sig Dispense Refill Last Dose  . folic acid (FOLVITE) 1 MG tablet Take 2 tablets (2 mg total) by mouth daily. (Patient not taking: Reported on 08/09/2017) 60 tablet 1 Not Taking  . OLANZapine (ZYPREXA) 5 MG tablet Take 1 tablet (5 mg total) by mouth at bedtime. (Patient not taking: Reported on 09/29/2017) 10 tablet 0 Not Taking at Unknown time  . prenatal vitamin w/FE, FA (PRENATAL 1 + 1) 27-1 MG TABS tablet Take 1 tablet by mouth daily at 12 noon. 30 each 1 Past Month at Unknown time  . sertraline (ZOLOFT) 50 MG tablet Take 1 tab, PO daily for 4 days then 2 tabs PO daily thereafter 34 tablet 0     Musculoskeletal: Strength & Muscle Tone: within normal limits Gait & Station: normal Patient leans: N/A  Psychiatric Specialty Exam: Physical Exam  Nursing note and vitals reviewed. Constitutional: She appears well-developed and well-nourished.  HENT:  Head: Normocephalic and atraumatic.  Eyes: Pupils are equal, round, and reactive to light. Conjunctivae are normal.  Neck: Normal range of motion.  Cardiovascular: Normal heart sounds.  Respiratory: Effort normal.  GI: Soft.  Musculoskeletal: Normal range of motion.  Neurological: She is alert.  Skin: Skin is warm and dry.  Psychiatric: Her affect is blunt. Her speech is delayed. She is slowed. Thought content is paranoid.    Review of  Systems  Constitutional: Negative.   HENT: Negative.   Eyes: Negative.   Respiratory: Negative.   Cardiovascular: Negative.   Gastrointestinal: Negative.   Musculoskeletal: Negative.   Skin: Negative.   Neurological: Negative.   Psychiatric/Behavioral: Negative for depression, hallucinations, memory loss, substance abuse and suicidal ideas. The patient is nervous/anxious and has insomnia.     Blood pressure (!) 141/82, pulse 96, temperature 98.7 F (37.1 C), temperature source Oral, resp. rate 18, height 5' 4"  (1.626 m), weight 102.1 kg (225 lb), last menstrual period 03/05/2018, SpO2 (!) 86 %.Body mass index is 38.62 kg/m.  General Appearance: Casual  Eye Contact:  Fair  Speech:  Slow  Volume:  Decreased  Mood:  Dysphoric  Affect:  Congruent  Thought Process:  Goal Directed  Orientation:  Full (Time, Place, and Person)  Thought Content:  Illogical and Paranoid Ideation  Suicidal Thoughts:  No  Homicidal Thoughts:  No  Memory:  Immediate;   Fair Recent;   Fair Remote;   Fair  Judgement:  Fair  Insight:  Fair  Psychomotor Activity:  Decreased  Concentration:  Concentration: Fair  Recall:  Smiley Houseman of Knowledge:  Fair  Language:  Fair  Akathisia:  No  Handed:  Right  AIMS (if indicated):     Assets:  Desire for Improvement Financial Resources/Insurance Housing Physical Health Resilience Social Support  ADL's:  Intact  Cognition:  WNL  Sleep:       Treatment Plan Summary: Daily contact with patient to assess and evaluate symptoms and progress in treatment, Medication management and Plan Patient admitted to the psychiatric service.  Had a talk with the patient and her mother about long-acting injectables.  We will start her onInvega tonight with the goal of possibly switching over to long-acting injectable.  Patient also requesting medication for sleep which can be provided with trazodone.  Supportive counseling and review of treatment plan.  Ordered full set of labs  included an EKG and all of that will be reviewed.  [place] team met today  Observation Level/Precautions:  15 minute checks  Laboratory:  HbAIC  Psychotherapy:    Medications:    Consultations:    Discharge Concerns:    Estimated LOS:  Other:     Physician Treatment Plan for Primary Diagnosis: Schizophrenia spectrum disorder with psychotic disorder type not yet determined ([place]) Long Term Goal(s): Improvement in symptoms so as ready for discharge  Short Term Goals: Ability to verbalize feelings will improve and Ability to demonstrate self-control will improve  Physician Treatment Plan for Secondary Diagnosis: Principal Problem:   Schizophrenia spectrum disorder with psychotic disorder type not yet determined ([place]) Active Problems:   Noncompliance  Long Term Goal(s): Improvement in symptoms so as ready for discharge  Short Term Goals: Ability to maintain clinical measurements within normal limits will improve and Compliance with prescribed medications will improve  I certify that inpatient services furnished can reasonably be expected to improve the patient's condition.    Alethia Berthold, MD 5/1/20198:57 PM

## 2018-03-26 NOTE — BHH Group Notes (Signed)
LCSW Group Therapy Note  03/26/2018 1:00 PM  Type of Therapy/Topic:  Group Therapy:  Emotion Regulation  Participation Level:  None   Description of Group:    The purpose of this group is to assist patients in learning to regulate negative emotions and experience positive emotions. Patients will be guided to discuss ways in which they have been vulnerable to their negative emotions. These vulnerabilities will be juxtaposed with experiences of positive emotions or situations, and patients will be challenged to use positive emotions to combat negative ones. Special emphasis will be placed on coping with negative emotions in conflict situations, and patients will process healthy conflict resolution skills.  Therapeutic Goals: 1. Patient will identify two positive emotions or experiences to reflect on in order to balance out negative emotions 2. Patient will label two or more emotions that they find the most difficult to experience 3. Patient will demonstrate positive conflict resolution skills through discussion and/or role plays  Summary of Patient Progress:  Trenise was a little hesitant about coming to today's group. She asked CSW if she was required to come to group.  CSW informed Safire that we encourage group attendance and participation, but it is her choice whether to come to group or not.  Tamsin remained in group for the entire duration, but did not wish to actively participate in discussion.     Therapeutic Modalities:   Cognitive Behavioral Therapy Feelings Identification Dialectical Behavioral Therapy

## 2018-03-26 NOTE — Progress Notes (Signed)
Recreation Therapy Notes  INPATIENT RECREATION THERAPY ASSESSMENT  Patient Details Name: Tracy Poole MRN: [id number] DOB: Aug 09, 1996 Today's Date: 03/26/2018       Information Obtained From: Patient  Able to Participate in Assessment/Interview: Yes  Patient Presentation: Responsive  Reason for Admission (Per Patient): Aggressive/Threatening, Other (Comments)(My attitude, weird thoughts)  Patient Stressors: Family, Work  Coping Skills:   Other (Comment)(Smoke)  Leisure Interests (2+):  Individual - TV, Music - Listen  Frequency of Recreation/Participation: Weekly  Awareness of Community Resources:     [employer]:     Current Use:    If no, Barriers?:    Expressed Interest in [employer] Information:    [place] of Residence:  Film/video editor  Patient Main Form of Transportation: Set designer  Patient Strengths:  Beautiful personality, Nice, Caring  Patient Identified Areas of Improvement:  My attitude  Patient Goal for Hospitalization:  To do what I have to and get home to my daughter.  Current SI (including self-harm):  No  Current HI:  No  Current AVH: No  Staff Intervention Plan: Group Attendance, Collaborate with Interdisciplinary Treatment Team  Consent to Intern Participation: N/A  Takyra Cantrall 03/26/2018, 1:57 PM

## 2018-03-26 NOTE — B[facility] Counselor (Signed)
Adult Comprehensive Assessment  Patient ID: Tracy Poole, female   DOB: Aug 24, 1996, 22 y.o.   MRN: [id number]  Information Source:  Patient   Current Stressors:  Educational / Learning stressors: n/a Employment / Job issues: Unemployed Family Relationships: Pt. reports having issues with her mother at times Surveyor, quantity / Lack of resources (include bankruptcy): n/a Housing / Lack of housing: n/a Physical health (include injuries & life threatening diseases): Pt. has a new born baby 5months  Substance abuse: Patient denies  Bereavement / Loss: n/a   Living/Environment/Situation:  Living Arrangements: Parent Living conditions (as described by patient or guardian): Patient states it is going okay.  How long has patient lived in current situation?: 1 year What is atmosphere in current home: "I don't like being there. We don't get along sometimes."   Family History:  Marital status: Single Are you sexually active?: Yes What is your sexual orientation?: heterosexual Has your sexual activity been affected by drugs, alcohol, medication, or emotional stress?: n/a Does patient have children?: 1- 5months old. Pt. reports that her relationship with her baby is great and that she misses her.   Childhood History:  By whom was/is the patient raised?: Mother, Grandparents Additional childhood history information: n/a Description of patient's relationship with caregiver when they were a child: Patient states she had a good childhood Patient's description of current relationship with people who raised him/her: Patient states she gets along with her mother and grandparents.  How were you disciplined when you got in trouble as a child/adolescent?: n/a Does patient have siblings?: Yes Number of Siblings: 2 Description of patient's current relationship with siblings: Patient states she has siblings on her father side.  Did patient suffer any verbal/emotional/physical/sexual abuse as a child?: No Did  patient suffer from severe childhood neglect?: No Has patient ever been sexually abused/assaulted/raped as an adolescent or adult?: No Was the patient ever a victim of a crime or a disaster?: No Witnessed domestic violence?: No Has patient been effected by domestic violence as an adult?: No   Education:  Highest grade of school patient has completed: 11th grade Currently a student?: No Learning disability?: No   Employment/Work Situation:   Employment situation: Unemployed Patient's job has been impacted by current illness: No What is the longest time patient has a held a job?: 6 months Where was the patient employed at that time?: [employer] Has patient ever been in the [employer]?: No Has patient ever served in combat?: No Did You Receive Any Psychiatric Treatment/Services While in Equities trader?: No Are There Guns or Education officer, community in Your Home?: No Are These Comptroller?:  (n/a)   Financial Resources:   Surveyor, quantity resources: Support from parents / caregiver, Medicaid Does patient have a Lawyer or guardian?: No   Alcohol/Substance Abuse:   What has been your use of drugs/alcohol within the last 12 months?: Patient denies If attempted suicide, did drugs/alcohol play a role in this?: No Alcohol/Substance Abuse Treatment Hx: Denies past history Has alcohol/substance abuse ever caused legal problems?: No   Social Support System:   Forensic psychologist System: "Its Okay" Describe Community Support System: Patient has support from mother  Type of faith/religion: n/a How does patient's faith help to cope with current illness?: n/a   Leisure/Recreation:   Leisure and Hobbies: listening to music, writing, dancing, video games   Strengths/Needs:   What things does the patient do well?: cooking, doing hair, communication In what areas does patient struggle / problems for patient: "I  just need help getting back to how I used to be"   Discharge Plan:    Does patient have access to transportation?: Yes Will patient be returning to same living situation after discharge?: Yes Currently receiving community mental health services: Yes at [facility] If no, would patient like referral for services when discharged?: No (What county?) [facility]) Does patient have financial barriers related to discharge medications?: No    Summary/Recommendations:   Summary and Recommendations (to be completed by the evaluator): Patient is a 22 year old African American female admitted involuntarily by her mother with a history of hospitalization for psychotic disorder. Patient lives with her mother and 38-month-old baby in [place]. She reports stressors of relationship issues with her mother at times and being away from her baby currently. She denies any substance use. Her UDS was negative for all substances. Her affect was constricted. At discharge, patient return home and continue to follow up with [facility] for outpatient services. While here, patient will benefit from crisis stabilization, medication evaluation, group therapy and psychoeducation, in addition to case management for discharge planning. At discharge, it is recommended that patient remain compliant with the established discharge plan and continue treatment.   Johny Shears. 03/26/2018

## 2018-03-26 NOTE — Progress Notes (Signed)
Recreation Therapy Notes  Date: 03/26/2018  Time: 9:30 am  Location: Craft Room  Behavioral response: Appropriate  Intervention Topic: Values  Discussion/Intervention:  Group content today was focused on values. The group identified what values are and where they come from. Individuals expressed some values and how many they have. Patients described how they go about adding or removing values. The group described the importance of having values and how they go about using them in daily life. Patient participated in the intervention "My Values" where they were able to pick out values that were important to them and make a visual aide.  Clinical Observations/Feedback:  Patient came to group and was focused on what her peers and staff had to say about values. She participated in the intervention and was social with peers and staff during group.  Cinthia Rodden LRT/CTRS          Lew Prout 03/26/2018 11:47 AM

## 2018-03-26 NOTE — Tx Team (Signed)
Initial Treatment Plan 03/26/2018 10:30 AM Tracy Poole ZOX:[id number]    PATIENT STRESSORS: Financial difficulties Medication change or noncompliance Substance abuse   PATIENT STRENGTHS: Ability for insight Capable of independent living Communication skills Supportive family/friends   PATIENT IDENTIFIED PROBLEMS: Altered thought Process 03/26/18   Substance abuse  03/26/18  Non compliant  With Medication 5/1//9                 DISCHARGE CRITERIA:  Ability to meet basic life and health needs Improved stabilization in mood, thinking, and/or behavior  PRELIMINARY DISCHARGE PLAN: Outpatient therapy Return to previous living arrangement Return to previous work or school arrangements  PATIENT/FAMILY INVOLVEMENT: This treatment plan has been presented to and reviewed with the patient, Tracy Poole, and/or family member,  .  The patient and family have been given the opportunity to ask questions and make suggestions.  Crist Infante, RN 03/26/2018, 10:30 AM

## 2018-03-26 NOTE — Plan of Care (Signed)
Aware of information given to patient  concerning [place] . Patient remains  emotional and mentally  altered. Information given in concrete form  for better understanding . Patient understanding  the importance of rest periods during shift. Introduced to Pharmacologist.Encourage to attend groups for better understanding of feelings Problem: Education: Goal: Knowledge of [facility] Education information/materials will improve Outcome: Progressing Goal: Emotional status will improve Outcome: Not Progressing Goal: Mental status will improve Outcome: Not Progressing Goal: Verbalization of understanding the information provided will improve Outcome: Progressing   Problem: Activity: Goal: Will verbalize the importance of balancing activity with adequate rest periods Outcome: Not Progressing   Problem: Coping: Goal: Coping ability will improve Outcome: Progressing Goal: Will verbalize feelings Outcome: Not Progressing

## 2018-03-27 LAB — LIPID PANEL
Cholesterol: 132 mg/dL (ref 0–200)
HDL: 29 mg/dL — ABNORMAL LOW (ref >40)
LDL CALC: 91 mg/dL (ref 0–99)
Total CHOL/HDL Ratio: 4.6 RATIO
Triglycerides: 58 mg/dL (ref <150)
VLDL: 12 mg/dL (ref 0–40)

## 2018-03-27 LAB — HEMOGLOBIN A1C
HEMOGLOBIN A1C: 4.9 % (ref 4.8–5.6)
Mean Plasma Glucose: 93.93 mg/dL

## 2018-03-27 LAB — TSH: TSH: 1.155 u[IU]/mL (ref 0.350–4.500)

## 2018-03-27 MED ORDER — PALIPERIDONE ER 3 MG PO TB24
6.0000 mg | ORAL_TABLET | Freq: Every day | ORAL | Status: DC
  Administered 2018-03-27: 6 mg via ORAL
  Filled 2018-03-27: qty 2

## 2018-03-27 NOTE — Progress Notes (Signed)
Patient slept for 7 hours, no issues.

## 2018-03-27 NOTE — Progress Notes (Signed)
Nursing note 7a-7p  Pt observed interacting with peers on unit this shift. Pt ambulated around unit without issue. Displayed a flat affect and sullen/ depressed mood but brightens upon interaction with this Clinical research associate. Pt  Cooperative, and ate meals without issue. Pt denies pain as well as SI/HI and able to contract for safety. Pt also denies any audio or visual hallucinations at this time.  Pt continues to remain safe on the unit and continues to be observed by rounding every 15 min. No signs or symptoms of pain or distress noted.  Pt's goal for today is to "work on the way I think". Pt denies further need at this time. RN will continue to monitor.

## 2018-03-27 NOTE — Plan of Care (Signed)
Pt progressing towards goals.

## 2018-03-27 NOTE — [facility] Group Notes (Signed)
LCSW Group Therapy Note 03/27/2018 9:00 AM  Type of Therapy and Topic:  Group Therapy:  Setting Goals  Participation Level:  Minimal  Description of Group: In this process group, patients discussed using strengths to work toward goals and address challenges.  Patients identified two positive things about themselves and one goal they were working on.  Patients were given the opportunity to share openly and support each other's plan for self-empowerment.  The group discussed the value of gratitude and were encouraged to have a daily reflection of positive characteristics or circumstances.  Patients were encouraged to identify a plan to utilize their strengths to work on current challenges and goals.  Therapeutic Goals 1. Patient will verbalize personal strengths/positive qualities and relate how these can assist with achieving desired personal goals 2. Patients will verbalize affirmation of peers plans for personal change and goal setting 3. Patients will explore the value of gratitude and positive focus as related to successful achievement of goals 4. Patients will verbalize a plan for regular reinforcement of personal positive qualities and circumstances.  Summary of Patient Progress:  Omnia did not choose to actively engage in today's group discussion on setting goals.  Quynn did nod her head yes when CSW asked if she understood how she could develop her own goals using the SMART Model.  Edla also shared with group participants that her goal when in the hospital is to do the things that she need to do to get discharged as soon as possible.  Maritza shared that she is attending all groups and taking her medication as a part of this.  CSW also encouraged Allizon to try to participate more in the groups as she probably has come good insight that could be shared with other group members     Therapeutic Modalities Cognitive Behavioral Therapy Motivational Interviewing    Alease Frame,  LCSW 03/27/2018 2:06 PM

## 2018-03-27 NOTE — Progress Notes (Signed)
Patient denied any acute pain this shift. Refused to take scheduled Trazadone and Invaga. Stated she feels drowsy after taking Zypreza. Resting quietly at this time with her eyes closed, respirations even and unlabored. Will continue to monitor every 15 minutes.

## 2018-03-27 NOTE — Plan of Care (Signed)
Pt calm and cooperative. Pt out in the dayroom most of evening. Pt denies SI/HI. Pt just concerned about going home to care for her baby, because her Tracy Poole has to go work tomorrow. Encouraged patient to talk to doctor. Pt compliant with medications. Will continue to monitor.

## 2018-03-27 NOTE — Progress Notes (Signed)
Recreation Therapy Notes  Date: 03/27/2018  Time: 9:30 am  Location: Craft Room  Behavioral response: Appropriate  Intervention Topic: Creative expressions  Discussion/Intervention:  Group content on today was focused on creative expressions. The group defined creative expressions and ways they use creative expressions. Individual identified other positive ways creative expressions can be used and why it is important to express yourself. Patients participated in the intervention "expressive painting", where they had a chance to creatively express themselves. Clinical Observations/Feedback:  Patient came to group and defined creative expressions as being creative and using your emotuions. Individual identified drawing, singing, writing and listening to music as ways she creatively expresses herself. She stated it is important to creatively expresses herself so others will know how she feels. Patient participated in the intervention and was social with peers and staff during group.    Emmilynn Marut LRT/CTRS         Zikeria Keough 03/27/2018 10:49 AM

## 2018-03-27 NOTE — [facility] Group Notes (Signed)
  03/27/2018  Time: 1PM  Type of Therapy/Topic:  Group Therapy:  Balance in Life  Participation Level:  Minimal  Description of Group:   This group will address the concept of balance and how it feels and looks when one is unbalanced. Patients will be encouraged to process areas in their lives that are out of balance and identify reasons for remaining unbalanced. Facilitators will guide patients in utilizing problem-solving interventions to address and correct the stressor making their life unbalanced. Understanding and applying boundaries will be explored and addressed for obtaining and maintaining a balanced life. Patients will be encouraged to explore ways to assertively make their unbalanced needs known to significant others in their lives, using other group members and facilitator for support and feedback.  Therapeutic Goals: 1. Patient will identify two or more emotions or situations they have that consume much of in their lives. 2. Patient will identify signs/triggers that life has become out of balance:  3. Patient will identify two ways to set boundaries in order to achieve balance in their lives:  4. Patient will demonstrate ability to communicate their needs through discussion and/or role plays  Summary of Patient Progress: Pt continues to work towards their tx goals but has not yet reached them. Pt was able to appropriately participate in group discussion, and was able to offer support/validation to other group members. Pt reported one area of her life she would like to focus more on is, "my family." Pt was not able to provide an area of her life she would like to focus less on.    Therapeutic Modalities:   Cognitive Behavioral Therapy Solution-Focused Therapy Assertiveness Training  Heidi Dach, MSW, LCSW Clinical Social Worker 03/27/2018 1:56 PM

## 2018-03-27 NOTE — Tx Team (Signed)
Initial Treatment Plan 03/27/2018 9:48 AM Phebe Colla ZOX:[id number]    PATIENT STRESSORS: Marital or family conflict   PATIENT STRENGTHS: General fund of knowledge Physical Health Supportive family/friends   PATIENT IDENTIFIED PROBLEMS: Major Depressive disorder  "the way I think"                   DISCHARGE CRITERIA:  Ability to meet basic life and health needs Adequate post-discharge living arrangements Improved stabilization in mood, thinking, and/or behavior  PRELIMINARY DISCHARGE PLAN: Outpatient therapy Return to previous living arrangement Return to previous work or school arrangements  PATIENT/FAMILY INVOLVEMENT: This treatment plan has been presented to and reviewed with the patient, Phebe Colla, and/or family member.  The patient and family have been given the opportunity to ask questions and make suggestions.  Janne Lab, RN 03/27/2018, 9:48 AM

## 2018-03-27 NOTE — Progress Notes (Signed)
[facility] MD Progress Note  03/27/2018 8:07 PM Tracy Poole  MRN:  [id number] Subjective: Patient today was more talkative expressive and interactive.  She was able to describe some of the paranoia she was feeling at home.  Vague about whether that is still something she is worried about.  No inappropriate behavior today.  Denied suicidal or homicidal thought.  Denied any hallucinations.  Not acting bizarre. Principal Problem: Schizophrenia spectrum disorder with psychotic disorder type not yet determined [facility]) Diagnosis:   Patient Active Problem List   Diagnosis Date Noted  . Severe recurrent major depression with psychotic features (HCC) [F33.3] 03/26/2018  . Noncompliance [Z91.19] 03/25/2018  . Pregnancy [Z34.90] 09/29/2017  . Pregnancy affected by fetal growth restriction [O36.5990] 09/05/2017  . Abnormal findings on antenatal screening [O28.9]   . 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 First trimester pregnancy [Z34.91] 03/27/2017  . Schizophrenia spectrum disorder with psychotic disorder type not yet determined (HCC) [F29] 03/27/2017   Total Time spent with patient: 30 minutes  Past Psychiatric History: Recurrent episodes of psychosis  Past Medical History:  Past Medical History:  Diagnosis Date  . Anxiety   . Depression     Past Surgical History:  Procedure Laterality Date  . CESAREAN SECTION    . EUSTACHIAN TUBE DILATION    . HERNIA REPAIR     Family History: History reviewed. No pertinent family history. Family Psychiatric  History: None Social History:  Social History   Substance and Sexual Activity  Alcohol Use No     Social History   Substance and Sexual Activity  Drug Use No    Social History   Socioeconomic History  . Marital status: Single    Spouse name: Not on file  . Number of children: Not on file  . Years of education: Not on file  . Highest education level: Not on file  Occupational  History  . Not on file  Social Needs  . Financial resource strain: Not on file  . Food insecurity:    Worry: Not on file    Inability: Not on file  . Transportation needs:    Medical: Not on file    Non-medical: Not on file  Tobacco Use  . Smoking status: Former Smoker    Packs/day: 1.00    Types: Cigarettes  . Smokeless tobacco: Never Used  Substance and Sexual Activity  . Alcohol use: No  . Drug use: No  . Sexual activity: Yes    Birth control/protection: None  Lifestyle  . Physical activity:    Days per week: Not on file    Minutes per session: Not on file  . Stress: Not on file  Relationships  . Social connections:    Talks on phone: Not on file    Gets together: Not on file    Attends religious service: Not on file    Active member of club or organization: Not on file    Attends meetings of clubs or organizations: Not on file    Relationship status: Not on file  Other Topics Concern  . Not on file  Social History Narrative  . Not on file   Additional Social History:                         Sleep: Fair  Appetite:  Fair  Current Medications: Current Facility-Administered Medications  Medication Dose Route Frequency Provider Last Rate Last Dose  .  acetaminophen (TYLENOL) tablet 650 mg  650 mg Oral Q6H PRN Niklas Chretien T, MD      . alum & mag hydroxide-simeth (MAALOX/MYLANTA) 200-200-20 MG/5ML suspension 30 mL  30 mL Oral Q4H PRN Kalana Yust T, MD      . magnesium hydroxide (MILK OF MAGNESIA) suspension 30 mL  30 mL Oral Daily PRN Marjorie Deprey T, MD      . paliperidone (INVEGA) 24 hr tablet 6 mg  6 mg Oral QHS Krissia Schreier T, MD      . traZODone (DESYREL) tablet 100 mg  100 mg Oral QHS Taelyr Jantz, Jackquline Denmark, MD        Lab Results:  Results for orders placed or performed during the hospital encounter of 03/25/18 (from the past 48 hour(s))  Hemoglobin A1c     Status: None   Collection Time: 03/27/18  6:21 AM  Result Value Ref Range   Hgb A1c MFr Bld  4.9 4.8 - 5.6 %    Comment: (NOTE) Pre diabetes:          5.7%-6.4% Diabetes:              >6.4% Glycemic control for   <7.0% adults with diabetes    Mean Plasma Glucose 93.93 mg/dL    Comment: Performed at [facility], [address], [place], [place] [address]  Lipid panel     Status: Abnormal   Collection Time: 03/27/18  6:21 AM  Result Value Ref Range   Cholesterol 132 0 - 200 mg/dL   Triglycerides 58 <621 mg/dL   HDL 29 (L) >30 mg/dL   Total CHOL/HDL Ratio 4.6 RATIO   VLDL 12 0 - 40 mg/dL   LDL Cholesterol 91 0 - 99 mg/dL    Comment:        Total Cholesterol/HDL:CHD Risk Coronary Heart Disease Risk Table                     Men   Women  1/2 Average Risk   3.4   3.3  Average Risk       5.0   4.4  2 X Average Risk   9.6   7.1  3 X Average Risk  23.4   11.0        Use the calculated Patient Ratio above and the CHD Risk Table to determine the patient's CHD Risk.        ATP III CLASSIFICATION (LDL):  <100     mg/dL   Optimal  865-784  mg/dL   Near or Above                    Optimal  130-159  mg/dL   Borderline  696-295  mg/dL   High  >284     mg/dL   Very High Performed at [facility], [address] [place], [place] [address]   TSH     Status: None   Collection Time: 03/27/18  6:21 AM  Result Value Ref Range   TSH 1.155 0.350 - 4.500 uIU/mL    Comment: Performed by a 3rd Generation assay with a functional sensitivity of <=0.01 uIU/mL. Performed at [facility], [address], [place], [place] [address]     Blood Alcohol level:  Lab Results  Component Value Date   [facility] <10 03/24/2018   ETH <5 08/09/2017    Metabolic Disorder Labs: Lab Results  Component Value Date   HGBA1C 4.9 03/27/2018  MPG 93.93 03/27/2018   MPG 100 03/28/2017   Lab Results  Component Value Date   PROLACTIN 53.4 (H) 03/28/2017   Lab Results  Component Value Date   CHOL 132 03/27/2018   TRIG 58 03/27/2018   HDL 29 (L) 03/27/2018   CHOLHDL 4.6  03/27/2018   VLDL 12 03/27/2018   LDLCALC 91 03/27/2018   LDLCALC 77 03/28/2017    Physical Findings: AIMS: Facial and Oral Movements Muscles of Facial Expression: None, normal Lips and Perioral Area: None, normal Jaw: None, normal Tongue: None, normal,Extremity Movements Upper (arms, wrists, hands, fingers): None, normal Lower (legs, knees, ankles, toes): None, normal, Trunk Movements Neck, shoulders, hips: None, normal, Overall Severity Severity of abnormal movements (highest score from questions above): None, normal Incapacitation due to abnormal movements: None, normal Patient's awareness of abnormal movements (rate only patient's report): No Awareness, Dental Status Current problems with teeth and/or dentures?: No Does patient usually wear dentures?: No  CIWA:    COWS:     Musculoskeletal: Strength & Muscle Tone: within normal limits Gait & Station: normal Patient leans: N/A  Psychiatric Specialty Exam: Physical Exam  Nursing note and vitals reviewed. Constitutional: She appears well-developed and well-nourished.  HENT:  Head: Normocephalic and atraumatic.  Eyes: Pupils are equal, round, and reactive to light. Conjunctivae are normal.  Neck: Normal range of motion.  Cardiovascular: Regular rhythm and normal heart sounds.  Respiratory: Effort normal.  GI: Soft.  Musculoskeletal: Normal range of motion.  Neurological: She is alert.  Skin: Skin is warm and dry.  Psychiatric: Judgment normal. Her mood appears anxious. Her speech is delayed. She is slowed. Thought content is paranoid. Cognition and memory are normal. She expresses no homicidal and no suicidal ideation.    Review of Systems  Constitutional: Negative.   HENT: Negative.   Eyes: Negative.   Respiratory: Negative.   Cardiovascular: Negative.   Gastrointestinal: Negative.   Musculoskeletal: Negative.   Skin: Negative.   Neurological: Negative.   Psychiatric/Behavioral: Negative for depression,  hallucinations, memory loss, substance abuse and suicidal ideas. The patient is nervous/anxious and has insomnia.     Blood pressure 104/79, pulse 98, temperature 98.4 F (36.9 C), temperature source Oral, resp. rate 18, height  (1.626 m), weight 102.1 kg (225 lb), last menstrual period 03/05/2018, SpO2 100 %.Body mass index is 38.62 kg/m.  General Appearance: Fairly Groomed  Eye Contact:  Good  Speech:  Slow  Volume:  Decreased  Mood:  Dysphoric  Affect:  Congruent  Thought Process:  Goal Directed  Orientation:  Full (Time, Place, and Person)  Thought Content:  Paranoid Ideation  Suicidal Thoughts:  No  Homicidal Thoughts:  No  Memory:  Immediate;   Fair Recent;   Fair Remote;   Fair  Judgement:  Fair  Insight:  Shallow  Psychomotor Activity:  Decreased  Concentration:  Concentration: Fair  Recall:  [employer] of Knowledge:  Fair  Language:  Fair  Akathisia:  No  Handed:  Right  AIMS (if indicated):     Assets:  Desire for Improvement Physical Health Resilience  ADL's:  Intact  Cognition:  WNL  Sleep:        Treatment Plan Summary: Daily contact with patient to assess and evaluate symptoms and progress in treatment, Medication management and Plan Switched antipsychotic to [facility].  Dose increased to 6 mg tonight followed by a shot tomorrow.  Likely to be discharged tomorrow.  No other physical symptoms that need change to treatment now.  Mordecai Rasmussen, MD 03/27/2018, 8:07 PM

## 2018-03-28 MED ORDER — TRAZODONE HCL 100 MG PO TABS: 100.0000 mg | ORAL_TABLET | Freq: Every evening | ORAL | 1 refills | Status: DC | PRN

## 2018-03-28 MED ORDER — PALIPERIDONE PALMITATE ER 156 MG/ML IM SUSY: 156.0000 mg | PREFILLED_SYRINGE | INTRAMUSCULAR | 2 refills | Status: DC

## 2018-03-28 MED ORDER — PALIPERIDONE PALMITATE ER 234 MG/1.5ML IM SUSY
234.0000 mg | PREFILLED_SYRINGE | Freq: Once | INTRAMUSCULAR | Status: AC
  Administered 2018-03-28: 234 mg via INTRAMUSCULAR
  Filled 2018-03-28: qty 1.5

## 2018-03-28 MED ORDER — TRAZODONE HCL 100 MG PO TABS: 100.0000 mg | ORAL_TABLET | Freq: Every evening | ORAL | Status: DC | PRN

## 2018-03-28 NOTE — BHH Suicide Risk Assessment (Signed)
[facility] Discharge Suicide Risk Assessment   Principal Problem: Schizophrenia spectrum disorder with psychotic disorder type not yet determined [facility]) Discharge Diagnoses:  Patient Active Problem List   Diagnosis Date Noted  . Severe recurrent major depression with psychotic features (HCC) [F33.3] 03/26/2018  . Noncompliance [Z91.19] 03/25/2018  . Pregnancy [Z34.90] 09/29/2017  . Pregnancy affected by fetal growth restriction [O36.5990] 09/05/2017  . Abnormal findings on antenatal screening [O28.9]   . 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 First trimester pregnancy [Z34.91] 03/27/2017  . Schizophrenia spectrum disorder with psychotic disorder type not yet determined (HCC) [F29] 03/27/2017    Total Time spent with patient: 30 minutes  Musculoskeletal: Strength & Muscle Tone: within normal limits Gait & Station: normal Patient leans: N/A  Psychiatric Specialty Exam: Review of Systems  Constitutional: Negative.   HENT: Negative.   Eyes: Negative.   Respiratory: Negative.   Cardiovascular: Negative.   Gastrointestinal: Negative.   Musculoskeletal: Negative.   Skin: Negative.   Neurological: Negative.   Psychiatric/Behavioral: Negative.     Blood pressure 138/74, pulse 82, temperature 98.7 F (37.1 C), temperature source Oral, resp. rate 18, height  (1.626 m), weight 102.1 kg (225 lb), last menstrual period 03/05/2018, SpO2 100 %.Body mass index is 38.62 kg/m.  General Appearance: Casual  Eye Contact::  Good  Speech:  Clear and Coherent409  Volume:  Normal  Mood:  Euthymic  Affect:  Constricted  Thought Process:  Goal Directed  Orientation:  Full (Time, Place, and Person)  Thought Content:  Logical  Suicidal Thoughts:  No  Homicidal Thoughts:  No  Memory:  Immediate;   Fair Recent;   Fair Remote;   Fair  Judgement:  Fair  Insight:  Fair  Psychomotor Activity:  Decreased  Concentration:  Good  Recall:   [employer] of Knowledge:Fair  Language: Fair  Akathisia:  No  Handed:  Right  AIMS (if indicated):     Assets:  Desire for Improvement  Sleep:  Number of Hours: 6.15  Cognition: WNL  ADL's:  Intact   Mental Status Per Nursing Assessment::   On Admission:  NA  Demographic Factors:  NA  Loss Factors: NA  Historical Factors: NA  Risk Reduction Factors:   Responsible for children under 71 years of age, Sense of responsibility to family and Religious beliefs about death  Continued Clinical Symptoms:  Schizophrenia:   Less than 41 years old  Cognitive Features That Contribute To Risk:  Loss of executive function    Suicide Risk:  Minimal: No identifiable suicidal ideation.  Patients presenting with no risk factors but with morbid ruminations; may be classified as minimal risk based on the severity of the depressive symptoms  Follow-up Information    [facility] Follow up on 04/02/2018.   Why:  Please attend your follow up appointment on Wednesday 04/02/2018 at 12:30pm. Thank you. Contact information: [address] [place] [address] [phone]           Plan Of Care/Follow-up recommendations:  Activity:  To that he has tolerated Diet:  Heart healthy diet Other:  Follow-up with R Maia Breslow, MD 03/28/2018, 12:59 PM

## 2018-03-28 NOTE — Progress Notes (Signed)
Recreation Therapy Notes  INPATIENT RECREATION TR PLAN  Patient Details Name: Tracy Poole MRN: [id number] DOB: 1996-10-31 Today's Date: 03/28/2018  Rec Therapy Plan Is patient appropriate for Therapeutic Recreation?: Yes Treatment times per week: at least 3 Estimated Length of Stay: 5-7 days TR Treatment/Interventions: Group participation (Comment)  Discharge Criteria Pt will be discharged from therapy if:: Discharged Treatment plan/goals/alternatives discussed and agreed upon by:: Patient/family  Discharge Summary Short term goals set: Patient will successfully identify 2 ways of making healthy decisions post d/c within 5 recreation therapy group sessions Short term goals met: Adequate for discharge Progress toward goals comments: Groups attended Which groups?: Leisure education, Other (Comment)(Values, Creative expressions) Reason goals not met: N/A Therapeutic equipment acquired: N/A Reason patient discharged from therapy: Discharge from hospital Pt/family agrees with progress & goals achieved: Yes Date patient discharged from therapy: 03/28/18   Ervin Hensley 03/28/2018, 1:28 PM

## 2018-03-28 NOTE — BHH Group Notes (Signed)

## 2018-03-28 NOTE — Progress Notes (Signed)
Recreation Therapy Notes  Date: 03/28/2018  Time: 9:30 am  Location: Craft Room  Behavioral response: Appropriate  Intervention Topic: Leisure  Discussion/Intervention:  Group content today was focused on leisure. The group defined what leisure is and some positive leisure activities they participate in. Individuals identified the difference between good and bad leisure. Participants expressed how they feel after participating in the leisure of their choice. The group discussed how they go about picking a leisure activity and if others are involved in their leisure activities. The patient stated how many leisure activities they too choose from and reasons why it is important to have leisure time. Individuals participated in the intervention "Leisure Jeopardy" where they had a chance to identify new leisure activities as well as benefits of leisure. Clinical Observations/Feedback:  Patient came to group and identified writing as a leisure activity she participates in. Individual participated in the intervention and was social with peers and staff doing group.  Rebekha Diveley LRT/CTRS         Carime Dinkel 03/28/2018 10:48 AM

## 2018-03-28 NOTE — Progress Notes (Signed)
   03/28/18 1510  Clinical Encounter Type  Visited With Patient  Visit Type Initial   Patient participated in chaplain led group, focus on communication and meditation.

## 2018-03-28 NOTE — Progress Notes (Signed)
Patient denies SI/HI, denies A/V hallucinations. Patient verbalizes understanding of discharge instructions, follow up care and prescriptions. Patient given all belongings from  locker. Patient escorted out by staff, transported by family. 

## 2018-03-28 NOTE — Progress Notes (Signed)
  [facility] Adult Case Management Discharge Plan :  Will you be returning to the same living situation after discharge:  Yes,  Home with mother At discharge, do you have transportation home?: Yes,  Pts. mother will come and pick her up Do you have the ability to pay for your medications: Yes,  Reffered to a provider who can assist  Release of information consent forms completed and in the chart;  Patient's signature needed at discharge.  Patient to Follow up at: Follow-up Information    [facility] Follow up on 04/02/2018.   Why:  Please attend your follow up appointment on Wednesday 04/02/2018 at 12:30pm. Thank you. Contact information: [address] [place] [address] [phone]           Next level of care provider has access to [facility] Link:no  Safety Planning and Suicide Prevention discussed: Areatha Keas, Mother, [phone]  Have you used any form of tobacco in the last 30 days? (Cigarettes, Smokeless Tobacco, Cigars, and/or Pipes): Yes  Has patient been referred to the Quitline?: N/A patient is not a smoker  Patient has been referred for addiction treatment: N/A  Johny Shears, LCSW 03/28/2018, 11:11 AM

## 2018-04-16 ENCOUNTER — Other Ambulatory Visit: Payer: Self-pay

## 2018-04-16 ENCOUNTER — Encounter: Payer: Self-pay | Admitting: Emergency Medicine

## 2018-04-16 ENCOUNTER — Emergency Department
Admission: EM | Admit: 2018-04-16 | Discharge: 2018-04-16 | Disposition: A | Discharge status: Home / Self Care | Payer: Medicaid Other | Attending: Emergency Medicine | Admitting: Emergency Medicine

## 2018-04-16 DIAGNOSIS — Z3202 Encounter for pregnancy test, result negative: Secondary | ICD-10-CM | POA: Insufficient documentation

## 2018-04-16 DIAGNOSIS — Z87891 Personal history of nicotine dependence: Secondary | ICD-10-CM | POA: Diagnosis not present

## 2018-04-16 DIAGNOSIS — Z79899 Other long term (current) drug therapy: Secondary | ICD-10-CM | POA: Diagnosis not present

## 2018-04-16 LAB — POCT PREGNANCY, URINE: Preg Test, Ur: NEGATIVE

## 2018-04-16 NOTE — ED Provider Notes (Signed)
[facility] Emergency Department Provider Note  ____________________________________________   None    (approximate)  I have reviewed the triage vital signs and the nursing notes.   HISTORY  Chief Complaint Possible Pregnancy   HPI Tracy Poole is a 22 y.o. female who self presents to the emergency department requesting a pregnancy test.  She has no other complaints at this time.  No abdominal pain nausea vomiting chest pain shortness of breath.  No dysuria frequency or hesitancy.  She has a young child with her from a recent pregnancy.  Past Medical History:  Diagnosis Date  . Anxiety   . Depression     Patient Active Problem List   Diagnosis Date Noted  . Severe recurrent major depression with psychotic features (HCC) 03/26/2018  . Noncompliance 03/25/2018  . Pregnancy 09/29/2017  . Pregnancy affected by fetal growth restriction 09/05/2017  . Abnormal findings on antenatal screening   . Major depressive disorder, single episode, severe with psychotic features (HCC) 03/28/2017  . First trimester pregnancy 03/27/2017  . Schizophrenia spectrum disorder with psychotic disorder type not yet determined (HCC) 03/27/2017    Past Surgical History:  Procedure Laterality Date  . CESAREAN SECTION    . EUSTACHIAN TUBE DILATION    . HERNIA REPAIR      Prior to Admission medications   Medication Sig Start Date End Date Taking? Authorizing Provider  paliperidone (INVEGA SUSTENNA) 156 MG/ML SUSY injection Inject 1 mL (156 mg total) into the muscle every 28 (twenty-eight) days. 04/04/18   Clapacs, Jackquline Denmark, MD  traZODone (DESYREL) 100 MG tablet Take 1 tablet (100 mg total) by mouth at bedtime as needed for sleep. 03/28/18   Clapacs, Jackquline Denmark, MD    Allergies Patient has no known allergies.  No family history on file.  Social History Social History   Tobacco Use  . Smoking status: Former Smoker    Packs/day: 1.00    Types: Cigarettes  . Smokeless  tobacco: Never Used  Substance Use Topics  . Alcohol use: No  . Drug use: No    Review of Systems Constitutional: No fever/chills ENT: No sore throat. Cardiovascular: Denies chest pain. Respiratory: Denies shortness of breath. Gastrointestinal: No abdominal pain.  No nausea, no vomiting.  No diarrhea.  No constipation. Musculoskeletal: Negative for back pain. Neurological: Negative for headaches   ____________________________________________   PHYSICAL EXAM:  VITAL SIGNS: ED Triage Vitals [04/16/18 0605]  Enc Vitals Group     BP 127/68     Pulse Rate 93     Resp 20     Temp 98.3 F (36.8 C)     Temp Source Oral     SpO2 99 %     Weight 225 lb (102.1 kg)     Height  (1.626 m)     Head Circumference      Peak Flow      Pain Score 0     Pain Loc      Pain Edu?      Excl. in GC?     Constitutional: Alert and oriented x4 pleasant cooperative speaks in full clear sentences no diaphoresis Head: Atraumatic. Nose: No congestion/rhinnorhea. Mouth/Throat: No trismus Neck: No stridor.   Cardiovascular: Regular rate and rhythm Respiratory: Normal respiratory effort.  No retractions. Neurologic:  Normal speech and language. No gross focal neurologic deficits are appreciated.  Skin:  Skin is warm, dry and intact. No rash noted.    ____________________________________________  LABS (all labs  ordered are listed, but only abnormal results are displayed)  Labs Reviewed  POC URINE PREG, ED  POCT PREGNANCY, URINE    Lab work reviewed by me shows the patient is not pregnant __________________________________________  EKG   ____________________________________________  RADIOLOGY   ____________________________________________   DIFFERENTIAL includes but not limited to  Pregnancy, ectopic pregnancy, gastritis   PROCEDURES  Procedure(s) performed: no  Procedures  Critical Care performed: no  Observation:  no ____________________________________________   INITIAL IMPRESSION / ASSESSMENT AND PLAN / ED COURSE  Pertinent labs & imaging results that were available during my care of the patient were reviewed by me and considered in my medical decision making (see chart for details).  The patient is asymptomatic with no pain.  She is not pregnant.  Feels reassured and would like to go home and follow-up with primary care.      ____________________________________________   FINAL CLINICAL IMPRESSION(S) / ED DIAGNOSES  Final diagnoses:  Pregnancy examination or test, negative result      NEW MEDICATIONS STARTED DURING THIS VISIT:  New Prescriptions   No medications on file     Note:  This document was prepared using Dragon voice recognition software and may include unintentional dictation errors.      Merrily Brittle, MD 04/16/18 [phone]

## 2018-04-16 NOTE — ED Triage Notes (Signed)
Patient to ER for c/o "I just want a pregnancy test because my stomach feels weird, like something is moving in there.". Patient denies any abdominal pain, but states "it feels like my stomach may be upset". Patient reports last period "just came and went so fast and the bleeding was lighter than normal", and is concerned she may be pregnant.

## 2018-04-16 NOTE — Discharge Instructions (Signed)
It was a pleasure to take care of you today, and thank you for coming to our emergency department.  If you have any questions or concerns before leaving please ask the nurse to grab me and I'm more than happy to go through your aftercare instructions again. ° °If you were prescribed any opioid pain medication today such as Norco, Vicodin, Percocet, morphine, hydrocodone, or oxycodone please make sure you do not drive when you are taking this medication as it can alter your ability to drive safely. ° °If you have any concerns once you are home that you are not improving or are in fact getting worse before you can make it to your follow-up appointment, please do not hesitate to call 911 and come back for further evaluation. ° °Helaina Stefano, MD ° ° ° °

## 2018-04-18 NOTE — Discharge Summary (Signed)
Physician Discharge Summary Note  Patient:  Tracy Poole is an 22 y.o., female MRN:  [id number] DOB:  1996/01/22 Patient phone:  [phone] (home)  Patient address:   [place] [address],  Total Time spent with patient: 45 minutes  Date of Admission:  03/25/2018 Date of Discharge: Mar 28, 2018  Reason for Admission: Patient admitted through the emergency room because of concerns on the part of her family about bizarre psychotic behavior in the past history of psychosis.  Principal Problem: Schizophrenia spectrum disorder with psychotic disorder type not yet determined [facility]) Discharge Diagnoses: Patient Active Problem List   Diagnosis Date Noted  . Severe recurrent major depression with psychotic features ([place]) [F33.3] 03/26/2018  . Noncompliance [Z91.19] 03/25/2018  . Pregnancy [Z34.90] 09/29/2017  . Pregnancy affected by fetal growth restriction [O36.5990] 09/05/2017  . Abnormal findings on antenatal screening [O28.9]   . Major depressive disorder, single episode, severe with psychotic features ([place]) [F32.3] 03/28/2017  . First trimester pregnancy [Z34.91] 03/27/2017  . Schizophrenia spectrum disorder with psychotic disorder type not yet determined [facility]) [F29] 03/27/2017    Past Psychiatric History: Past history of psychotic symptoms and agitation and noncompliance  Past Medical History:  Past Medical History:  Diagnosis Date  . Anxiety   . Depression     Past Surgical History:  Procedure Laterality Date  . CESAREAN SECTION    . EUSTACHIAN TUBE DILATION    . HERNIA REPAIR     Family History: History reviewed. No pertinent family history. Family Psychiatric  History: None Social History:  Social History   Substance and Sexual Activity  Alcohol Use No     Social History   Substance and Sexual Activity  Drug Use No    Social History   Socioeconomic History  . Marital status: Single    Spouse name: Not on file  . Number of children: Not on file   . Years of education: Not on file  . Highest education level: Not on file  Occupational History  . Not on file  Social Needs  . Financial resource strain: Not on file  . Food insecurity:    Worry: Not on file    Inability: Not on file  . Transportation needs:    Medical: Not on file    Non-medical: Not on file  Tobacco Use  . Smoking status: Former Smoker    Packs/day: 1.00    Types: Cigarettes  . Smokeless tobacco: Never Used  Substance and Sexual Activity  . Alcohol use: No  . Drug use: No  . Sexual activity: Yes    Birth control/protection: None  Lifestyle  . Physical activity:    Days per week: Not on file    Minutes per session: Not on file  . Stress: Not on file  Relationships  . Social connections:    Talks on phone: Not on file    Gets together: Not on file    Attends religious service: Not on file    Active member of club or organization: Not on file    Attends meetings of clubs or organizations: Not on file    Relationship status: Not on file  Other Topics Concern  . Not on file  Social History Narrative  . Not on file    Hospital Course: Patient was admitted through the emergency room.  Generally cooperative with group and individual therapy.  Did not show any dangerous behaviors on the unit.  Seemed a little withdrawn at first but  was cooperative with restarting antipsychotic medication.  Met with treatment team and was logical and coherent.  Family visited and felt like she was stabilizing.  Discharge with plan to follow-up in the community and continue on her antipsychotic medicine  Physical Findings: AIMS: Facial and Oral Movements Muscles of Facial Expression: None, normal Lips and Perioral Area: None, normal Jaw: None, normal Tongue: None, normal,Extremity Movements Upper (arms, wrists, hands, fingers): None, normal Lower (legs, knees, ankles, toes): None, normal, Trunk Movements Neck, shoulders, hips: None, normal, Overall Severity Severity of  abnormal movements (highest score from questions above): None, normal Incapacitation due to abnormal movements: None, normal Patient's awareness of abnormal movements (rate only patient's report): No Awareness, Dental Status Current problems with teeth and/or dentures?: No Does patient usually wear dentures?: No  CIWA:    COWS:     Musculoskeletal: Strength & Muscle Tone: within normal limits Gait & Station: normal Patient leans: N/A  Psychiatric Specialty Exam: Physical Exam  Nursing note and vitals reviewed. Constitutional: She appears well-developed and well-nourished.  HENT:  Head: Normocephalic and atraumatic.  Eyes: Pupils are equal, round, and reactive to light. Conjunctivae are normal.  Neck: Normal range of motion.  Cardiovascular: Regular rhythm and normal heart sounds.  Respiratory: Effort normal. No respiratory distress.  GI: Soft.  Musculoskeletal: Normal range of motion.  Neurological: She is alert.  Skin: Skin is warm and dry.  Psychiatric: She has a normal mood and affect. Her speech is normal and behavior is normal. Judgment and thought content normal. Her affect is not blunt. Cognition and memory are normal.    Review of Systems  Constitutional: Negative.   HENT: Negative.   Eyes: Negative.   Respiratory: Negative.   Cardiovascular: Negative.   Gastrointestinal: Negative.   Musculoskeletal: Negative.   Skin: Negative.   Neurological: Negative.   Psychiatric/Behavioral: Negative.     Blood pressure 138/74, pulse 82, temperature 98.7 F (37.1 C), temperature source Oral, resp. rate 18, height _0  (1.626 m), weight 102.1 kg (225 lb), last menstrual period 03/26/2018, SpO2 100 %.Body mass index is 38.62 kg/m.  General Appearance: Casual  Eye Contact:  Good  Speech:  Clear and Coherent  Volume:  Normal  Mood:  Euthymic  Affect:  Congruent  Thought Process:  Goal Directed  Orientation:  Full (Time, Place, and Person)  Thought Content:  Logical   Suicidal Thoughts:  No  Homicidal Thoughts:  No  Memory:  Immediate;   Fair Recent;   Fair Remote;   Fair  Judgement:  Fair  Insight:  Fair  Psychomotor Activity:  Normal  Concentration:  Concentration: Fair  Recall:  [employer] of Knowledge:  Fair  Language:  Fair  Akathisia:  No  Handed:  Right  AIMS (if indicated):     Assets:  Desire for Improvement Physical Health Resilience Social Support  ADL's:  Intact  Cognition:  WNL  Sleep:  Number of Hours: 6.15     Have you used any form of tobacco in the last 30 days? (Cigarettes, Smokeless Tobacco, Cigars, and/or Pipes): Yes  Has this patient used any form of tobacco in the last 30 days? (Cigarettes, Smokeless Tobacco, Cigars, and/or Pipes) Yes, No  Blood Alcohol level:  Lab Results  Component Value Date   ETH <10 03/24/2018   ETH <5 16/11/930    Metabolic Disorder Labs:  Lab Results  Component Value Date   HGBA1C 4.9 03/27/2018   MPG 93.93 03/27/2018   MPG 100 03/28/2017  Lab Results  Component Value Date   PROLACTIN 53.4 (H) 03/28/2017   Lab Results  Component Value Date   CHOL 132 03/27/2018   TRIG 58 03/27/2018   HDL 29 (L) 03/27/2018   CHOLHDL 4.6 03/27/2018   VLDL 12 03/27/2018   LDLCALC 91 03/27/2018   LDLCALC 77 03/28/2017    See Psychiatric Specialty Exam and Suicide Risk Assessment completed by Attending Physician prior to discharge.  Discharge destination:  Home  Is patient on multiple antipsychotic therapies at discharge:  No   Has Patient had three or more failed trials of antipsychotic monotherapy by history:  No  Recommended Plan for Multiple Antipsychotic Therapies: NA  Discharge Instructions    Diet - low sodium heart healthy   Complete by:  As directed    Increase activity slowly   Complete by:  As directed      Allergies as of 03/28/2018   No Known Allergies     Medication List    STOP taking these medications   folic acid 1 MG tablet Commonly known as:  FOLVITE    OLANZapine 5 MG tablet Commonly known as:  ZYPREXA   prenatal vitamin w/FE, FA 27-1 MG Tabs tablet   sertraline 50 MG tablet Commonly known as:  ZOLOFT     TAKE these medications     Indication  paliperidone 156 MG/ML Susy injection Commonly known as:  INVEGA SUSTENNA Inject 1 mL (156 mg total) into the muscle every 28 (twenty-eight) days.  Indication:  Schizophrenia   traZODone 100 MG tablet Commonly known as:  DESYREL Take 1 tablet (100 mg total) by mouth at bedtime as needed for sleep.  Indication:  [place] Follow up on 04/02/2018.   Why:  Please attend your follow up appointment on Wednesday 04/02/2018 at 12:30pm. Thank you. Contact information: [place] [address] [phone]           Follow-up recommendations:  Activity:  Activity as tolerated Diet:  Heart healthy diet Other:  Follow-up with outpatient psychiatric treatment  Comments: Patient was discharged back to her family.  Agreed to continue outpatient antipsychotic medication as prescribed  Signed: Alethia Berthold, MD 04/18/2018, 5:53 PM

## 2018-07-01 ENCOUNTER — Encounter: Payer: Self-pay | Admitting: Emergency Medicine

## 2018-07-01 ENCOUNTER — Emergency Department
Admission: EM | Admit: 2018-07-01 | Discharge: 2018-07-01 | Disposition: A | Discharge status: Home / Self Care | Payer: Medicaid Other | Attending: Emergency Medicine | Admitting: Emergency Medicine

## 2018-07-01 ENCOUNTER — Other Ambulatory Visit: Payer: Self-pay

## 2018-07-01 DIAGNOSIS — Y9389 Activity, other specified: Secondary | ICD-10-CM | POA: Diagnosis not present

## 2018-07-01 DIAGNOSIS — F419 Anxiety disorder, unspecified: Secondary | ICD-10-CM | POA: Insufficient documentation

## 2018-07-01 DIAGNOSIS — N390 Urinary tract infection, site not specified: Secondary | ICD-10-CM | POA: Diagnosis not present

## 2018-07-01 DIAGNOSIS — Z79899 Other long term (current) drug therapy: Secondary | ICD-10-CM | POA: Diagnosis not present

## 2018-07-01 DIAGNOSIS — Y998 Other external cause status: Secondary | ICD-10-CM | POA: Diagnosis not present

## 2018-07-01 DIAGNOSIS — Y929 Unspecified place or not applicable: Secondary | ICD-10-CM | POA: Diagnosis not present

## 2018-07-01 DIAGNOSIS — S39012A Strain of muscle, fascia and tendon of lower back, initial encounter: Secondary | ICD-10-CM | POA: Diagnosis not present

## 2018-07-01 DIAGNOSIS — S3992XA Unspecified injury of lower back, initial encounter: Secondary | ICD-10-CM | POA: Diagnosis present

## 2018-07-01 DIAGNOSIS — F329 Major depressive disorder, single episode, unspecified: Secondary | ICD-10-CM | POA: Insufficient documentation

## 2018-07-01 DIAGNOSIS — Z87891 Personal history of nicotine dependence: Secondary | ICD-10-CM | POA: Diagnosis not present

## 2018-07-01 DIAGNOSIS — X500XXA Overexertion from strenuous movement or load, initial encounter: Secondary | ICD-10-CM | POA: Insufficient documentation

## 2018-07-01 DIAGNOSIS — R319 Hematuria, unspecified: Secondary | ICD-10-CM | POA: Diagnosis not present

## 2018-07-01 DIAGNOSIS — F209 Schizophrenia, unspecified: Secondary | ICD-10-CM | POA: Diagnosis not present

## 2018-07-01 LAB — URINALYSIS, COMPLETE (UACMP) WITH MICROSCOPIC
BILIRUBIN URINE: NEGATIVE
Bacteria, UA: NONE SEEN
Glucose, UA: NEGATIVE mg/dL
Ketones, ur: NEGATIVE mg/dL
Nitrite: NEGATIVE
PH: 6 (ref 5.0–8.0)
Protein, ur: NEGATIVE mg/dL
SPECIFIC GRAVITY, URINE: 1.017 (ref 1.005–1.030)

## 2018-07-01 MED ORDER — BACLOFEN 10 MG PO TABS: 10.0000 mg | ORAL_TABLET | Freq: Every day | ORAL | 0 refills | Status: DC

## 2018-07-01 MED ORDER — MELOXICAM 15 MG PO TABS: 15.0000 mg | ORAL_TABLET | Freq: Every day | ORAL | 0 refills | Status: DC

## 2018-07-01 MED ORDER — NITROFURANTOIN MONOHYD MACRO 100 MG PO CAPS: 100.0000 mg | ORAL_CAPSULE | Freq: Two times a day (BID) | ORAL | 0 refills | Status: DC

## 2018-07-01 NOTE — ED Notes (Signed)
See triage note  Presents with lower back pain and urinary freq  dnies any injury  Ambulates well to treatment room

## 2018-07-01 NOTE — ED Triage Notes (Signed)
Patient states she thinks she pulled a muscle in her lower back on Sat. At [place] [employer] - denies [employer] comp.  Works as Lawyer[employer]NA.  Denies radiation of pain. Patient also has urinary frequency and malodorous urine states "I can't hold my urine" starting this AM.

## 2018-07-01 NOTE — Discharge Instructions (Signed)
Follow-up with your regular doctor if not better in 3 to 5 days.  Use medication as prescribed.  If your urinary tract symptoms are not improving in 3 days please return emergency department

## 2018-07-01 NOTE — ED Notes (Addendum)
First Nurse Note: Patient complaining of mid lower back pain starting Sat at work - denies [employer]'s comp, also complaining of malodorous urine starting yesterday.

## 2018-07-01 NOTE — ED Provider Notes (Signed)
[facility] Emergency Department Provider Note  ____________________________________________   First MD Initiated Contact with Patient 07/01/18 1002     (approximate)  I have reviewed the triage vital signs and the nursing notes.   HISTORY  Chief Complaint Back Pain and Urinary Frequency    HPI Tracy Poole is a 22 y.o. female presents emergency department C/o low back pain for 1 day, positive known injury, dates she was lifting and pulling on a patient and felt a bad pain in her back.  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  Pain is worse with movement, increased with bending over, denies numbness, tingling, or changes in bowel habits.  She also states that she has had a bad odor to her urine and is having difficulty holding her urine due to the frequency.  She is afraid she has a UTI.  Using otc meds without relief.  Patient states she is on a questionable Prolixin injection for schizophrenia.  Remainder ros neg   Past Medical History:  Diagnosis Date  . Anxiety   . Depression     Patient Active Problem List   Diagnosis Date Noted  . Severe recurrent major depression with psychotic features ([facility]) 03/26/2018  . Noncompliance 03/25/2018  . Pregnancy 09/29/2017  . Pregnancy affected by fetal growth restriction 09/05/2017  . Abnormal findings on antenatal screening   . Major depressive disorder, single episode, severe with psychotic features ([facility]) 03/28/2017  . First trimester pregnancy 03/27/2017  . Schizophrenia spectrum disorder with psychotic disorder type not yet determined ([facility]) 03/27/2017    Past Surgical History:  Procedure Laterality Date  . CESAREAN SECTION    . EUSTACHIAN TUBE DILATION    . HERNIA REPAIR      Prior to Admission medications   Medication Sig Start Date End Date Taking? Authorizing Provider  baclofen (LIORESAL) 10 MG tablet Take 1 tablet (10 mg total) by mouth daily. 07/01/18  07/01/19  Sherrie MustacheFisher, Roselyn BeringSusan W, PA-C  meloxicam (MOBIC) 15 MG tablet Take 1 tablet (15 mg total) by mouth daily. 07/01/18 07/01/19  Sherrie MustacheFisher, Roselyn BeringSusan W, PA-C  nitrofurantoin, macrocrystal-monohydrate, (MACROBID) 100 MG capsule Take 1 capsule (100 mg total) by mouth 2 (two) times daily. 07/01/18   Eastyn Skalla, Roselyn BeringSusan W, PA-C  paliperidone (INVEGA SUSTENNA) 156 MG/ML SUSY injection Inject 1 mL (156 mg total) into the muscle every 28 (twenty-eight) days. 04/04/18   Clapacs, Jackquline DenmarkJohn T, MD  traZODone (DESYREL) 100 MG tablet Take 1 tablet (100 mg total) by mouth at bedtime as needed for sleep. 03/28/18   Clapacs, Jackquline DenmarkJohn T, MD    Allergies Patient has no known allergies.  No family history on file.  Social History Social History   Tobacco Use  . Smoking status: Former Smoker    Packs/day: 1.00    Types: Cigarettes  . Smokeless tobacco: Never Used  Substance Use Topics  . Alcohol use: No  . Drug use: No    Review of Systems  Constitutional: No fever/chills Eyes: No visual changes. ENT: No sore throat. Respiratory: Denies cough Genitourinary: Negative for dysuria but positive for urinary frequency and odor, denies vaginal discharge. Musculoskeletal: Positive for back pain. Skin: Negative for rash.    ____________________________________________   PHYSICAL EXAM:  VITAL SIGNS: ED Triage Vitals  Enc Vitals Group     BP 07/01/18 0951 115/69     Pulse Rate 07/01/18 0951 96     Resp --      Temp 07/01/18  0951 97.9 F (36.6 C)     Temp Source 07/01/18 0951 Oral     SpO2 07/01/18 0951 98 %     Weight 07/01/18 0952 240 lb (108.9 kg)     Height 07/01/18 0952 5\' 4"  (1.626 m)     Head Circumference --      Peak Flow --      Pain Score 07/01/18 0951 7     Pain Loc --      Pain Edu? --      Excl. in GC? --     Constitutional: Alert and oriented. Well appearing and in no acute distress. Eyes: Conjunctivae are normal.  Head: Atraumatic. Nose: No congestion/rhinnorhea. Mouth/Throat: Mucous membranes are  moist.   Neck:  supple no lymphadenopathy noted Cardiovascular: Normal rate, regular rhythm. Heart sounds are normal Respiratory: Normal respiratory effort.  No retractions, lungs c t a  Abd: soft nontender bs normal all 4 quad GU: deferred Musculoskeletal: FROM all extremities, warm and well perfused.  Decreased rom of back due to discomfort, lumbar spine is mildly tender, paravertebral muscles are spasmed in the lower lumbar area, negative Slr, 10 out of 10 strength in great toes b/l, 10 out of 10 strength in lower legs, n/v intact Neurologic:  Normal speech and language.  Skin:  Skin is warm, dry and intact. No rash noted. Psychiatric: Mood and affect are normal. Speech and behavior are normal.  ____________________________________________   LABS (all labs ordered are listed, but only abnormal results are displayed)  Labs Reviewed  URINALYSIS, COMPLETE (UACMP) WITH MICROSCOPIC - Abnormal; Notable for the following components:      Result Value   Color, Urine YELLOW (*)    APPearance CLOUDY (*)    Hgb urine dipstick SMALL (*)    Leukocytes, UA LARGE (*)    RBC / HPF >50 (*)    WBC, UA >50 (*)    Non Squamous Epithelial PRESENT (*)    All other components within normal limits  URINE CULTURE   ____________________________________________   ____________________________________________  RADIOLOGY    ____________________________________________   PROCEDURES  Procedure(s) performed: No  Procedures    ____________________________________________   INITIAL IMPRESSION / ASSESSMENT AND PLAN / ED COURSE  Pertinent labs & imaging results that were available during my care of the patient were reviewed by me and considered in my medical decision making (see chart for details).   Patient is 22 year old female presents emergency department complaining of a low back injury and also questionable UTI.  States she went to roll a patient and felt muscles pull in her lower back.   She went to roll another patient and could not because of the pain.  She is concerned about a UTI she has had foul smelling urine with urinary frequency.  She denies fever chills.  On physical exam patient appears well.  She is afebrile.  Lumbar area is tender to palpation with spasms in the paravertebral muscles of the lumbar spine.  Remainder the exam is unremarkable  UA is positive with large amount of leuks, greater than 50 red blood cells, greater than 50 white blood cells  Test result was explained to the patient.  She was given a prescription for Macrobid, baclofen, and meloxicam.  She is to apply ice to the lower back for the next 2 days.  Then switch to wet heat followed by ice.  She was given instructions on back exercises.  She is to follow-up with her regular doctor if not better  in 3 to 5 days.  Return to emergency department if worsening.  All prescriptions were E scripted to the [facility].     As part of my medical decision making, I reviewed the following data within the electronic MEDICAL RECORD NUMBER Nursing notes reviewed and incorporated, Labs reviewed UA positive for large leuks, greater than 50 red blood cells and greater than 50 white blood cells, Old chart reviewed, Notes from prior ED visits and [place] Controlled Substance Database  ____________________________________________   FINAL CLINICAL IMPRESSION(S) / ED DIAGNOSES  Final diagnoses:  Urinary tract infection with hematuria, site unspecified  Strain of lumbar region, initial encounter      NEW MEDICATIONS STARTED DURING THIS VISIT:  Discharge Medication List as of 07/01/2018 11:02 AM    START taking these medications   Details  baclofen (LIORESAL) 10 MG tablet Take 1 tablet (10 mg total) by mouth daily., Starting Tue 07/01/2018, Until Wed 07/01/2019, Normal    meloxicam (MOBIC) 15 MG tablet Take 1 tablet (15 mg total) by mouth daily., Starting Tue 07/01/2018, Until Wed 07/01/2019, Normal      nitrofurantoin, macrocrystal-monohydrate, (MACROBID) 100 MG capsule Take 1 capsule (100 mg total) by mouth 2 (two) times daily., Starting Tue 07/01/2018, Normal         Note:  This document was prepared using Dragon voice recognition software and may include unintentional dictation errors.    Faythe Ghee, PA-C 07/01/18 1153    Jene Every, MD 07/01/18 [phone]

## 2018-07-03 LAB — URINE CULTURE
Culture: >=100000 — AB
Special Requests: NORMAL

## 2018-07-15 ENCOUNTER — Emergency Department: Payer: Medicaid Other

## 2018-07-15 ENCOUNTER — Other Ambulatory Visit: Payer: Self-pay

## 2018-07-15 ENCOUNTER — Encounter: Payer: Self-pay | Admitting: Emergency Medicine

## 2018-07-15 ENCOUNTER — Emergency Department
Admission: EM | Admit: 2018-07-15 | Discharge: 2018-07-15 | Disposition: A | Discharge status: Home / Self Care | Payer: Medicaid Other | Attending: Emergency Medicine | Admitting: Emergency Medicine

## 2018-07-15 DIAGNOSIS — M545 Low back pain, unspecified: Secondary | ICD-10-CM

## 2018-07-15 DIAGNOSIS — Z79899 Other long term (current) drug therapy: Secondary | ICD-10-CM | POA: Diagnosis not present

## 2018-07-15 DIAGNOSIS — R310 Gross hematuria: Secondary | ICD-10-CM | POA: Diagnosis not present

## 2018-07-15 DIAGNOSIS — Z87891 Personal history of nicotine dependence: Secondary | ICD-10-CM | POA: Insufficient documentation

## 2018-07-15 LAB — URINALYSIS, COMPLETE (UACMP) WITH MICROSCOPIC
BILIRUBIN URINE: NEGATIVE
Glucose, UA: NEGATIVE mg/dL
Ketones, ur: NEGATIVE mg/dL
NITRITE: NEGATIVE
PH: 8 (ref 5.0–8.0)
Protein, ur: 30 mg/dL — AB
RBC / HPF: >50 RBC/hpf — ABNORMAL HIGH (ref 0–5)
SPECIFIC GRAVITY, URINE: 1.018 (ref 1.005–1.030)
Squamous Epithelial / LPF: NONE SEEN (ref 0–5)

## 2018-07-15 LAB — POC URINE PREG, ED: Preg Test, Ur: NEGATIVE

## 2018-07-15 MED ORDER — MELOXICAM 15 MG PO TABS: 15.0000 mg | ORAL_TABLET | Freq: Every day | ORAL | 0 refills | Status: DC

## 2018-07-15 MED ORDER — METHOCARBAMOL 500 MG PO TABS: 500.0000 mg | ORAL_TABLET | Freq: Four times a day (QID) | ORAL | 0 refills | Status: DC

## 2018-07-15 NOTE — ED Triage Notes (Signed)
Patient ambulatory to triage with steady gait, without difficulty or distress noted; pt reports lower back pain after work injury 8/4; st has prev filed workers comp

## 2018-07-15 NOTE — ED Provider Notes (Signed)
[facility] Emergency Department Provider Note  ____________________________________________  Time seen: Approximately 8:09 PM  I have reviewed the triage vital signs and the nursing notes.   HISTORY  Chief Complaint Back Pain    HPI Sandra Vella RedheadLatoni Kopplin is a 22 y.o. female who presents emergency department for ongoing lower back pain.  Patient reports that she was seen 2 weeks ago in the department after moving a patient and her job and suffering lower back pain.  Patient reports that the pain has been ongoing with no relief.  She denies any radicular symptoms.  No bowel or bladder dysfunction, saddle anesthesia, paresthesias.  No recurrent injury.  At the time, patient was also experiencing urinary symptoms.  She denies any at this time.  She denies dysuria, polyuria, hematuria.  Patient denies any headache, visual changes, chest pain, shortness of breath, abdominal pain, nausea vomiting, diarrhea constipation.  Patient denies taking any medications for this complaint prior to arrival.    Past Medical History:  Diagnosis Date  . Anxiety   . Depression     Patient Active Problem List   Diagnosis Date Noted  . Severe recurrent major depression with psychotic features (HCC) 03/26/2018  . Noncompliance 03/25/2018  . Pregnancy 09/29/2017  . Pregnancy affected by fetal growth restriction 09/05/2017  . Abnormal findings on antenatal screening   . Major depressive disorder, single episode, severe with psychotic features (HCC) 03/28/2017  . First trimester pregnancy 03/27/2017  . Schizophrenia spectrum disorder with psychotic disorder type not yet determined (HCC) 03/27/2017    Past Surgical History:  Procedure Laterality Date  . CESAREAN SECTION    . EUSTACHIAN TUBE DILATION    . HERNIA REPAIR      Prior to Admission medications   Medication Sig Start Date End Date Taking? Authorizing Provider  meloxicam (MOBIC) 15 MG tablet Take 1 tablet (15 mg total) by  mouth daily. 07/15/18   Byrne Capek, Delorise RoyalsJonathan D, PA-C  methocarbamol (ROBAXIN) 500 MG tablet Take 1 tablet (500 mg total) by mouth 4 (four) times daily. 07/15/18   Keshana Klemz, Delorise RoyalsJonathan D, PA-C  nitrofurantoin, macrocrystal-monohydrate, (MACROBID) 100 MG capsule Take 1 capsule (100 mg total) by mouth 2 (two) times daily. 07/01/18   Fisher, Roselyn BeringSusan W, PA-C  paliperidone (INVEGA SUSTENNA) 156 MG/ML SUSY injection Inject 1 mL (156 mg total) into the muscle every 28 (twenty-eight) days. 04/04/18   Clapacs, Jackquline DenmarkJohn T, MD  traZODone (DESYREL) 100 MG tablet Take 1 tablet (100 mg total) by mouth at bedtime as needed for sleep. 03/28/18   Clapacs, Jackquline DenmarkJohn T, MD    Allergies Patient has no known allergies.  No family history on file.  Social History Social History   Tobacco Use  . Smoking status: Former Smoker    Packs/day: 1.00    Types: Cigarettes  . Smokeless tobacco: Never Used  Substance Use Topics  . Alcohol use: No  . Drug use: No     Review of Systems  Constitutional: No fever/chills Eyes: No visual changes. No discharge ENT: No upper respiratory complaints. Cardiovascular: no chest pain. Respiratory: no cough. No SOB. Gastrointestinal: No abdominal pain.  No nausea, no vomiting.  No diarrhea.  No constipation. Genitourinary: Negative for dysuria. No hematuria. Musculoskeletal: Negative for musculoskeletal pain. Skin: Negative for rash, abrasions, lacerations, ecchymosis. Neurological: Negative for headaches, focal weakness or numbness. 10-point ROS otherwise negative.  ____________________________________________   PHYSICAL EXAM:  VITAL SIGNS: ED Triage Vitals [07/15/18 1904]  Enc Vitals Group     BP 118/67  Pulse Rate 98     Resp 18     Temp 97.8 F (36.6 C)     Temp Source Oral     SpO2 100 %     Weight 240 lb (108.9 kg)     Height 5\' 4"  (1.626 m)     Head Circumference      Peak Flow      Pain Score 7     Pain Loc      Pain Edu?      Excl. in GC?       Constitutional: Alert and oriented. Well appearing and in no acute distress. Eyes: Conjunctivae are normal. PERRL. EOMI. Head: Atraumatic. ENT:      Ears:       Nose: No congestion/rhinnorhea.      Mouth/Throat: Mucous membranes are moist.  Neck: No stridor.    Cardiovascular: Normal rate, regular rhythm. Normal S1 and S2.  Good peripheral circulation. Respiratory: Normal respiratory effort without tachypnea or retractions. Lungs CTAB. Good air entry to the bases with no decreased or absent breath sounds. Gastrointestinal: Bowel sounds 4 quadrants. Soft and nontender to palpation. No guarding or rigidity. No palpable masses. No distention. No CVA tenderness. Musculoskeletal: Full range of motion to all extremities. No gross deformities appreciated.  No visible abnormality to the lower spine.  Full range of motion to lumbar spine.  Diffuse tenderness to palpation throughout the lumbar spine with no specific point tenderness.  No palpable abnormality or step-off.  No tenderness to bilateral sciatic notches.  Negative straight leg raise bilaterally.  Dorsalis pedis pulse intact bilateral lower extremities.  Sensation intact and equal bilateral lower extremities. Neurologic:  Normal speech and language. No gross focal neurologic deficits are appreciated.  Skin:  Skin is warm, dry and intact. No rash noted. Psychiatric: Mood and affect are normal. Speech and behavior are normal. Patient exhibits appropriate insight and judgement.   ____________________________________________   LABS (all labs ordered are listed, but only abnormal results are displayed)  Labs Reviewed  URINALYSIS, COMPLETE (UACMP) WITH MICROSCOPIC - Abnormal; Notable for the following components:      Result Value   Color, Urine YELLOW (*)    APPearance HAZY (*)    Hgb urine dipstick LARGE (*)    Protein, ur 30 (*)    Leukocytes, UA TRACE (*)    RBC / HPF >50 (*)    Bacteria, UA FEW (*)    All other components  within normal limits  POC URINE PREG, ED   ____________________________________________  EKG   ____________________________________________  RADIOLOGY I personally viewed and evaluated these images as part of my medical decision making, as well as reviewing the written report by the radiologist.  I concur with radiologist of no acute osseous abnormality to the lumbar spine.  Dg Lumbar Spine Complete  Result Date: 07/15/2018 CLINICAL DATA:  Low back pain since an unspecified injury 06/29/2018. Initial encounter. EXAM: LUMBAR SPINE - COMPLETE 4+ VIEW COMPARISON:  None. FINDINGS: There is no evidence of lumbar spine fracture. Alignment is normal. Intervertebral disc spaces are maintained. IMPRESSION: Normal examination. Electronically Signed   By: Drusilla Kanner M.D.   On: 07/15/2018 21:31    ____________________________________________    PROCEDURES  Procedure(s) performed:    Procedures    Medications - No data to display   ____________________________________________   INITIAL IMPRESSION / ASSESSMENT AND PLAN / ED COURSE  Pertinent labs & imaging results that were available during my care of the patient were reviewed  by me and considered in my medical decision making (see chart for details).  Review of the [place] CSRS was performed in accordance of the [facility] prior to dispensing any controlled drugs.      Patient's diagnosis is consistent with low back pain, gross hematuria.  Patient presents the emergency department with continued complaint of lower back pain after an injury at work 2 weeks ago.  Urinalysis returns with hematuria.  X-ray reveals no acute osseous abnormality.  Patient is tender to palpation only along the lumbar spine.  No CVA tenderness.  No complaints of flank pain, groin pain, pelvic pain.  Patient reports that she has noticed hematuria for "a while."  At this time, patient states that she would like to keep this visit work comp related and states that she  will follow-up with urology at a later time.  No further imaging or work-up at this time.  Patient will be given anti-inflammatories and muscle relaxer for back pain..  Patient will follow-up with primary care for her back pain, urology for her ongoing hematuria.. Patient is given ED precautions to return to the ED for any worsening or new symptoms.     ____________________________________________  FINAL CLINICAL IMPRESSION(S) / ED DIAGNOSES  Final diagnoses:  Acute midline low back pain without sciatica  Gross hematuria      NEW MEDICATIONS STARTED DURING THIS VISIT:  ED Discharge Orders         Ordered    meloxicam (MOBIC) 15 MG tablet  Daily     07/15/18 2213    methocarbamol (ROBAXIN) 500 MG tablet  4 times daily     07/15/18 2213              This chart was dictated using voice recognition software/Dragon. Despite best efforts to proofread, errors can occur which can change the meaning. Any change was purely unintentional.    Racheal PatchesCuthriell, Katheen Aslin D, PA-C 07/15/18 2215    [facility] SemenGoodman, Graydon, MD 07/15/18 2228

## 2018-07-15 NOTE — ED Notes (Signed)
Pt able to stand and ambulate to the bathroom to provide urine sample.

## 2018-07-18 ENCOUNTER — Encounter: Payer: Self-pay | Admitting: Urology

## 2018-07-18 ENCOUNTER — Ambulatory Visit: Payer: Self-pay | Admitting: Urology

## 2018-07-31 ENCOUNTER — Encounter: Payer: Self-pay | Admitting: Urology

## 2018-07-31 ENCOUNTER — Telehealth: Payer: Self-pay

## 2018-07-31 ENCOUNTER — Ambulatory Visit (INDEPENDENT_AMBULATORY_CARE_PROVIDER_SITE_OTHER): Payer: Medicaid Other | Admitting: Urology

## 2018-07-31 VITALS — BP 112/71 | HR 103 | Ht 64.0 in | Wt 264.1 lb

## 2018-07-31 DIAGNOSIS — R369 Urethral discharge, unspecified: Secondary | ICD-10-CM | POA: Diagnosis not present

## 2018-07-31 DIAGNOSIS — R319 Hematuria, unspecified: Secondary | ICD-10-CM | POA: Diagnosis not present

## 2018-07-31 DIAGNOSIS — A599 Trichomoniasis, unspecified: Secondary | ICD-10-CM | POA: Diagnosis not present

## 2018-07-31 LAB — URINALYSIS, COMPLETE
Bilirubin, UA: NEGATIVE
Glucose, UA: NEGATIVE
Ketones, UA: NEGATIVE
Nitrite, UA: NEGATIVE
PH UA: 7 (ref 5.0–7.5)
PROTEIN UA: NEGATIVE
Specific Gravity, UA: 1.02 (ref 1.005–1.030)
UUROB: 0.2 mg/dL (ref 0.2–1.0)

## 2018-07-31 LAB — MICROSCOPIC EXAMINATION

## 2018-07-31 MED ORDER — METRONIDAZOLE 500 MG PO TABS: 500.0000 mg | ORAL_TABLET | Freq: Once | ORAL | 0 refills | Status: AC

## 2018-07-31 NOTE — Telephone Encounter (Signed)
Pharmacy called needing clarification on Flagyl medication. Instructions were unclear. I called and left a message for the pharmacy at [facility], stating pt is to take 4 tablets at once of 500mg  Flagyl, to equal a total of 2 grams.

## 2018-07-31 NOTE — Progress Notes (Signed)
   07/31/2018 11:53 AM   Tracy Poole September 27, 1996 [id number]  Referring provider: [facility] [address] [place], [place] [address]  CC: Urinary frequency/discharge/hematuria  HPI: I had the pleasure of seeing Tracy Poole in urology clinic today.  She is a 22 year old female who was seen in the emergency department on 07/01/2018 with back pain from a lifting injury as well as urinary frequency with urinary culture positive for Staphylococcus epidermidis UTI.  She has been treated with culture appropriate Macrobid.  Her hematuria resolved with treatment of her UTI.  Though her frequency has improved, she still reports foul-smelling urethral discharge present approximately 1 month.  There are no aggravating or alleviating factors.  She reports one new sexual partner.  She reports she has had sexually transmitted diseases previously.   PMH: Past Medical History:  Diagnosis Date  . Anxiety   . Depression     Surgical History: Past Surgical History:  Procedure Laterality Date  . CESAREAN SECTION    . EUSTACHIAN TUBE DILATION    . HERNIA REPAIR      Allergies: No Known Allergies  Family History: Family History  Problem Relation Age of Onset  . Bladder Cancer Neg Hx   . Kidney cancer Neg Hx     Social History:  reports that she has quit smoking. Her smoking use included cigarettes. She smoked 1.00 pack per day. She has never used smokeless tobacco. She reports that she does not drink alcohol or use drugs.  ROS: Please see flowsheet from today's date for complete review of systems.  Physical Exam: BP 112/71 (BP Location: Left Arm, Patient Position: Sitting, Cuff Size: Large)   Pulse (!) 103   Ht 5\' 4"  (1.626 m)   Wt 264 lb 1.6 oz (119.8 kg)   BMI 45.33 kg/m    Constitutional:  Alert and oriented, No acute distress. Cardiovascular: No clubbing, cyanosis, or edema. Respiratory: Normal respiratory effort, no increased work of  breathing. GI: Abdomen is soft, nontender, nondistended, no abdominal masses GU: No CVA tenderness Lymph: No cervical or inguinal lymphadenopathy. Skin: No rashes, bruises or suspicious lesions. Neurologic: Grossly intact, no focal deficits, moving all 4 extremities. Psychiatric: Normal mood and affect.  Laboratory Data: Urinalysis today greater than 30 WBCs, 0-2 RBCs, moderate bacteria, no yeast, trichomonas present, nitrite negative  Assessment & Plan:   In summary, Tracy Poole is a 22 year old female with recently treated staph urinary tract infection in early August, however ongoing urethral discharge concerning for STD.  Urine today in clinic is positive for trichomonas.  We had a long conversation about the importance of protection with condoms during sexual activity.  Her hematuria does not need further work-up, as it was associated with a urinary tract infection.  1.  Treat trichomonas with one-time dose of 2g metronidazole 2.  Urine sent for chlamydia/gonorrhea today, will call with results   Return if symptoms worsen or fail to improve.  Sondra Come, MD  [facility] [address], [address] [place], [place] [address] [phone]

## 2018-08-01 ENCOUNTER — Telehealth: Payer: Self-pay

## 2018-08-01 LAB — GC/CHLAMYDIA PROBE AMP
Chlamydia trachomatis, NAA: NEGATIVE
Neisseria gonorrhoeae by PCR: NEGATIVE

## 2018-08-01 NOTE — Telephone Encounter (Signed)
-----   Message from Sondra Come, MD sent at 08/01/2018  2:00 PM EDT ----- Regarding: STD results Please let her know Chlamydia and gonorrhea tests negative. She should start feeling better with the flagyl for trichomoniasis.  Thanks Sondra Come, MD 08/01/2018

## 2018-08-01 NOTE — Telephone Encounter (Signed)
Called pt no answer. LM for pt informing her of the information below per DPR.

## 2018-08-03 LAB — CULTURE, URINE COMPREHENSIVE

## 2018-08-15 ENCOUNTER — Other Ambulatory Visit: Payer: Self-pay | Admitting: Psychiatry

## 2018-10-05 IMAGING — US US OB COMP LESS 14 WK
1 series · 13 of 17 positions shown · non-contrast
Comparison: None.

CLINICAL DATA: First trimester bleeding.

EXAM:
OBSTETRIC <14 WK ULTRASOUND
TECHNIQUE: Transabdominal ultrasound was performed for evaluation of the
gestation as well as the maternal uterus and adnexal regions.

[Series 1: us ob comp less 14 wk · 0.28mm/px · 13 of 17 slices shown]
[im 1/17]
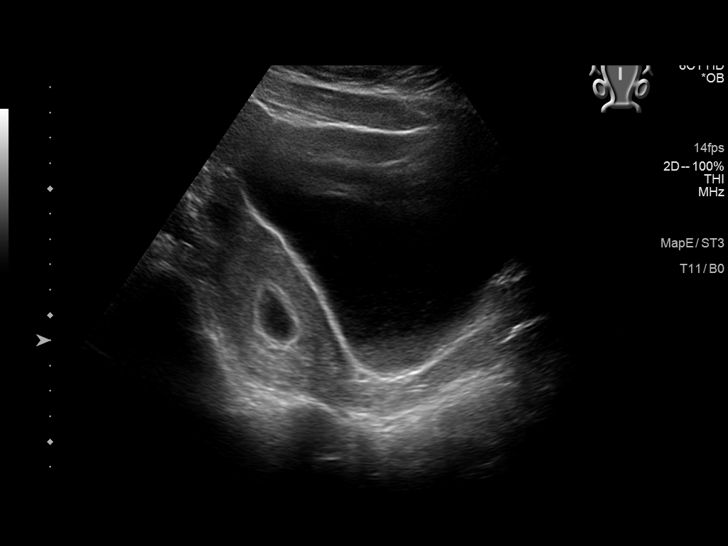
[im 2/17]
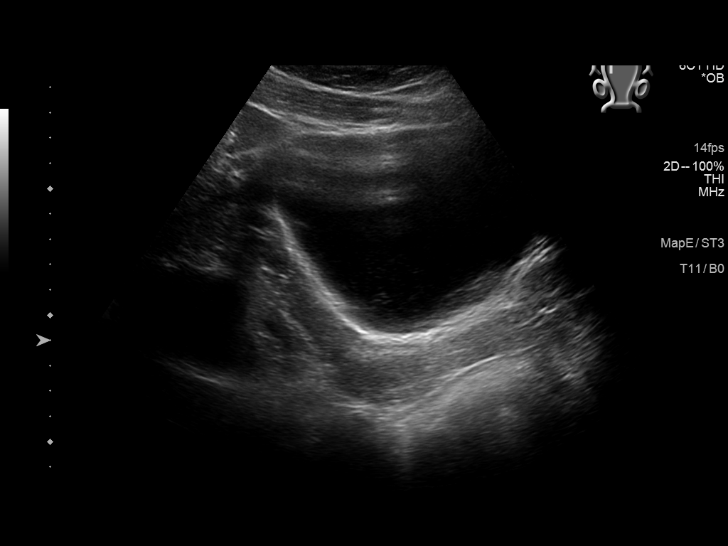
[im 4/17]
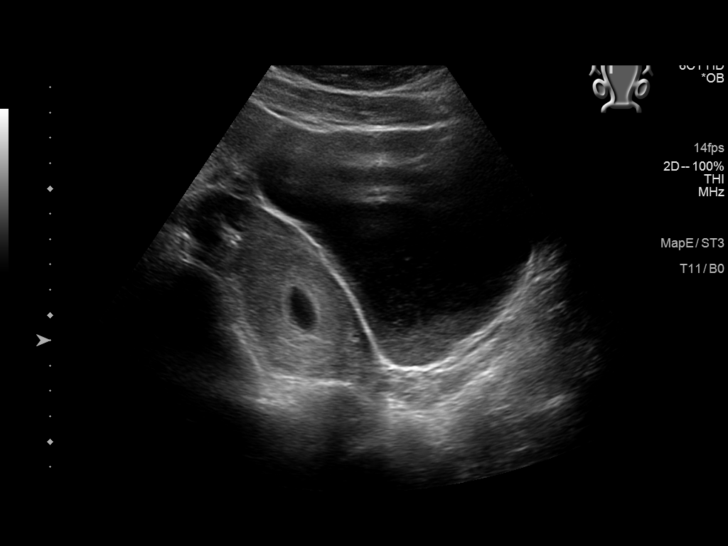
[im 5/17]
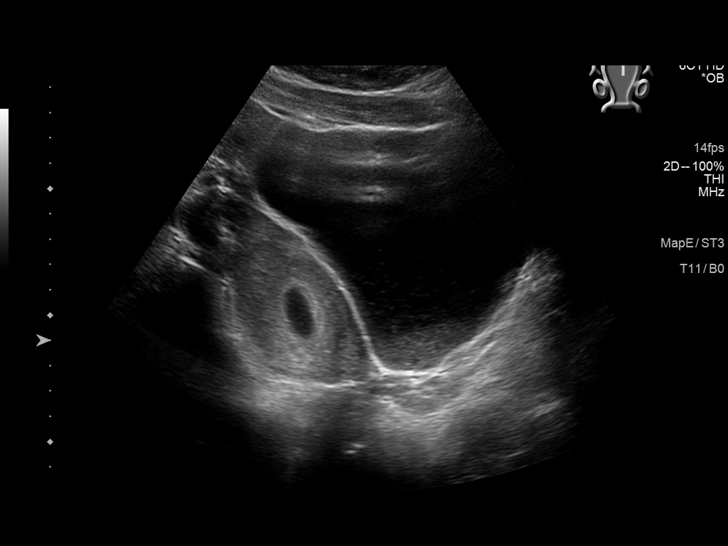
[im 6/17]
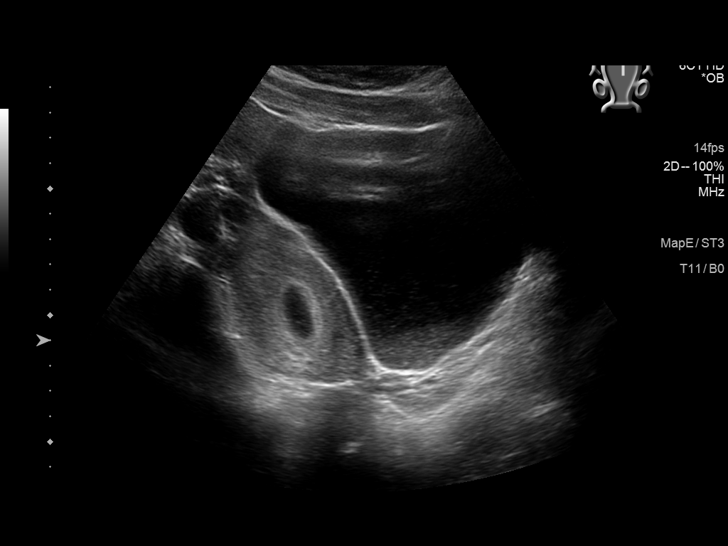
[im 8/17]
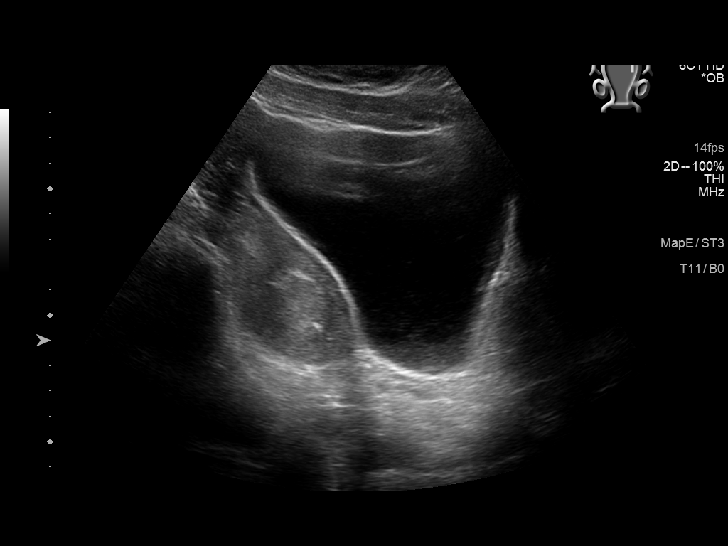
[im 9/17]
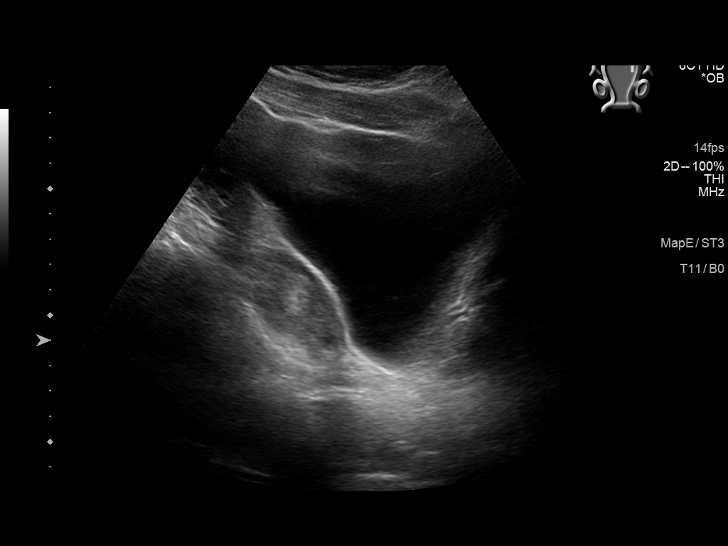
[im 10/17]
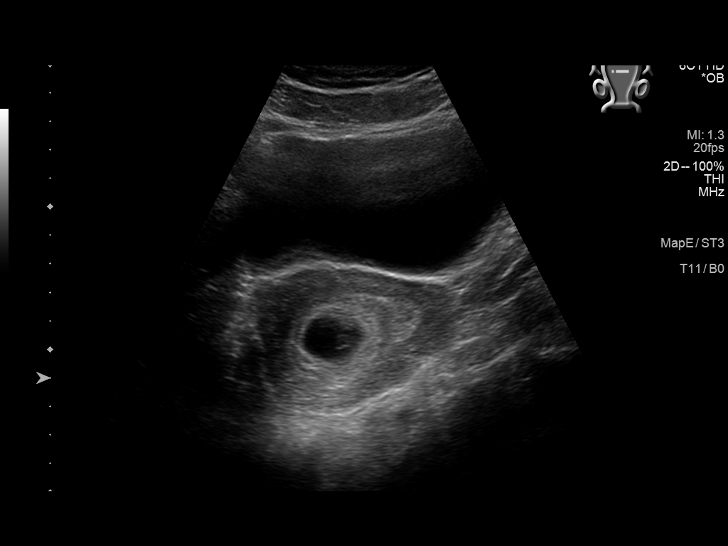
[im 12/17]
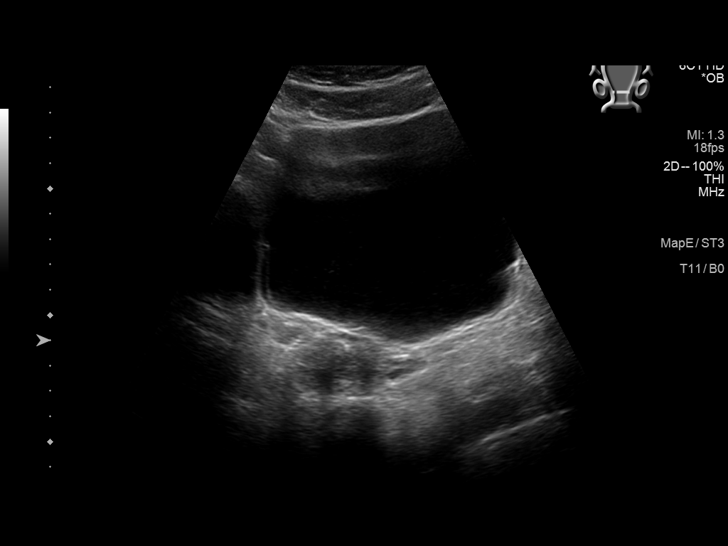
[im 13/17]
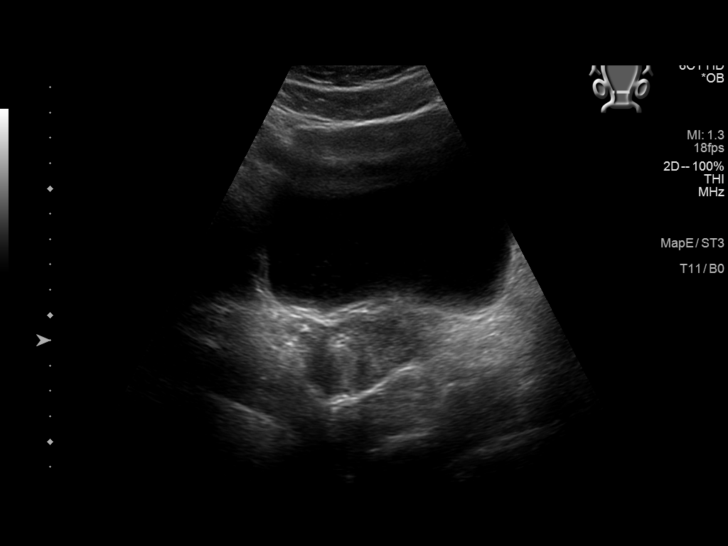
[im 14/17]
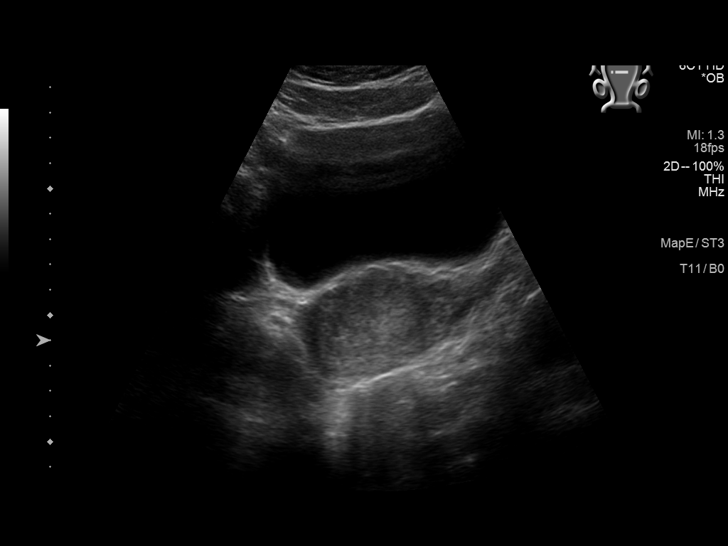
[im 16/17]
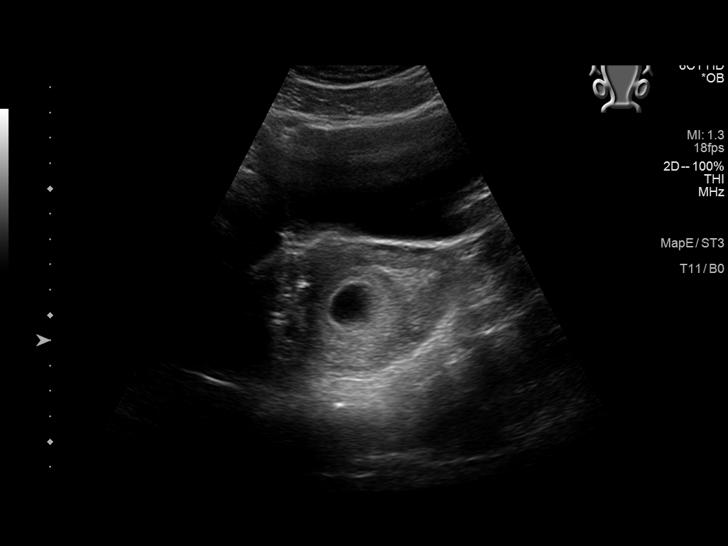
[im 17/17]
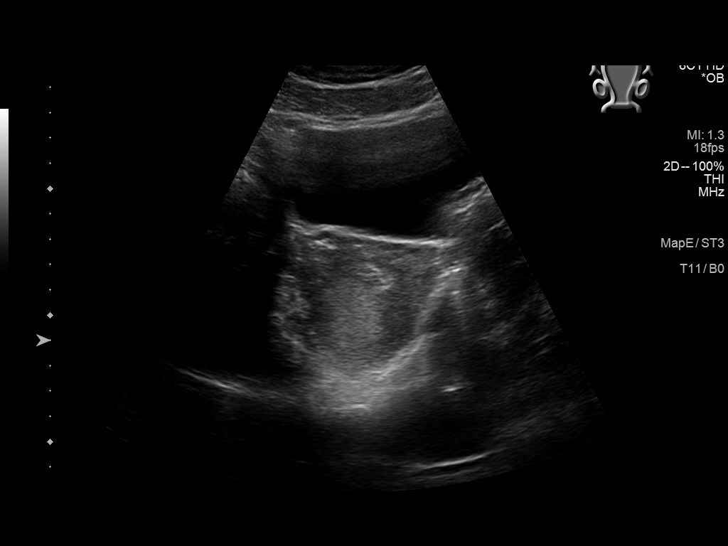

[13 of 17 positions shown; findings below may reference images not displayed]

FINDINGS: Intrauterine gestational sac: Single

Yolk sac:  Visualized.

MSD: Exam limited, not obtained.

Maternal uterus/adnexae:

Subchorionic hemorrhage: Nonvisualized

Right ovary: Exam limited not visualized

Left ovary: Exam limited.  Not visualize

Other :According to sonographer patient is extremely psychotic
preventing diagnostic ultrasound. Only 18 images were obtained
demonstrating a gestational sac and yolk sac. Debris noted within
the urinary bladder.
IMPRESSION: 1. Significantly diminished exam detail due to patient's mental
status.
2. Limited exam confirms presence of intrauterine gestational sac
containing a yolk sac.
3. Probable early intrauterine gestational sac with a yolk sac. No
fetal pole, or cardiac activity yet visualized. Recommend follow-up
quantitative B-HCG levels and follow-up US in 14 days to assess
viability. This recommendation follows SRU consensus guidelines:
Diagnostic Criteria for Nonviable Pregnancy Early in the First
Trimester. N Engl J Med 7574; [DATE].

## 2019-07-12 ENCOUNTER — Other Ambulatory Visit: Payer: Self-pay

## 2019-07-12 ENCOUNTER — Encounter: Payer: Self-pay | Admitting: *Deleted

## 2019-07-12 DIAGNOSIS — N898 Other specified noninflammatory disorders of vagina: Secondary | ICD-10-CM | POA: Diagnosis present

## 2019-07-12 DIAGNOSIS — Z79899 Other long term (current) drug therapy: Secondary | ICD-10-CM | POA: Diagnosis not present

## 2019-07-12 DIAGNOSIS — R879 Unspecified abnormal finding in specimens from female genital organs: Secondary | ICD-10-CM | POA: Diagnosis not present

## 2019-07-12 DIAGNOSIS — Z5329 Procedure and treatment not carried out because of patient's decision for other reasons: Secondary | ICD-10-CM | POA: Diagnosis not present

## 2019-07-12 DIAGNOSIS — F17210 Nicotine dependence, cigarettes, uncomplicated: Secondary | ICD-10-CM | POA: Diagnosis not present

## 2019-07-12 LAB — POCT PREGNANCY, URINE: Preg Test, Ur: NEGATIVE

## 2019-07-12 NOTE — ED Notes (Signed)
Pt not in the waiting room or answers when name called. lwbs

## 2019-07-12 NOTE — ED Triage Notes (Signed)
Patient states she has a vaginal discharge with an odor for one week. Patient would also like a pregnancy test because her period is late.

## 2019-07-12 NOTE — ED Notes (Signed)
Pt up to desk asking about wait time. Pt had been removed from the tracking board when call by flex RN but was in the lobby and did not hear her name called. Pt replaced on tracking board. Updated on wait time and is understanding.

## 2019-07-13 ENCOUNTER — Emergency Department
Admission: EM | Admit: 2019-07-13 | Discharge: 2019-07-13 | Discharge status: Against Medical Advice | Payer: Medicaid Other | Attending: Emergency Medicine | Admitting: Emergency Medicine

## 2019-07-13 DIAGNOSIS — R879 Unspecified abnormal finding in specimens from female genital organs: Secondary | ICD-10-CM

## 2019-07-13 NOTE — ED Notes (Signed)
Patient was not in the room when ED tech went in to collect wet prep and Chlamydia test. Dr. Archie Balboa aware.

## 2019-07-13 NOTE — ED Provider Notes (Signed)
[facility] Emergency Department Provider Note  ____________________________________________   I have reviewed the triage vital signs and the nursing notes.   HISTORY  Chief Complaint Vaginal Discharge   History limited by: Not Limited   HPI Tracy Poole is a 23 y.o. female who presents to the emergency department today because of concern for vaginal discharge with bad odor. She is not sure how long she has had the discharge for but states she has noticed that it is worse and the bad odor has been present for roughly 1 week. Initially she says that the odor was fishy. She says she has had BV in the past. She also was concerned she might be pregnant. Says that her period is late. Took a pregnancy test at home which was negative.    Records reviewed. Per medical record review patient has a history of trichomoniasis.   Past Medical History:  Diagnosis Date  . Anxiety   . Depression     Patient Active Problem List   Diagnosis Date Noted  . Severe recurrent major depression with psychotic features (HCC) 03/26/2018  . Noncompliance 03/25/2018  . Pregnancy 09/29/2017  . Pregnancy affected by fetal growth restriction 09/05/2017  . Abnormal findings on antenatal screening   . Major depressive disorder, single episode, severe with psychotic features (HCC) 03/28/2017  . First trimester pregnancy 03/27/2017  . Schizophrenia spectrum disorder with psychotic disorder type not yet determined (HCC) 03/27/2017    Past Surgical History:  Procedure Laterality Date  . CESAREAN SECTION    . EUSTACHIAN TUBE DILATION    . HERNIA REPAIR      Prior to Admission medications   Medication Sig Start Date End Date Taking? Authorizing Provider  meloxicam (MOBIC) 15 MG tablet Take 1 tablet (15 mg total) by mouth daily. 07/15/18   Cuthriell, Delorise RoyalsJonathan D, PA-C  paliperidone (INVEGA SUSTENNA) 156 MG/ML SUSY injection Inject 1 mL (156 mg total) into the muscle every 28  (twenty-eight) days. 04/04/18   Clapacs, Jackquline DenmarkJohn T, MD  traZODone (DESYREL) 100 MG tablet Take 1 tablet (100 mg total) by mouth at bedtime as needed for sleep. Patient not taking: Reported on 07/31/2018 03/28/18   Clapacs, Jackquline DenmarkJohn T, MD    Allergies Patient has no known allergies.  Family History  Problem Relation Age of Onset  . Bladder Cancer Neg Hx   . Kidney cancer Neg Hx     Social History Social History   Tobacco Use  . Smoking status: Current Every Day Smoker    Packs/day: 1.00    Types: Cigarettes  . Smokeless tobacco: Never Used  Substance Use Topics  . Alcohol use: Yes    Comment: occasionallly  . Drug use: No    Review of Systems Constitutional: No fever/chills Eyes: No visual changes. ENT: No sore throat. Cardiovascular: Denies chest pain. Respiratory: Denies shortness of breath. Gastrointestinal: No abdominal pain.  No nausea, no vomiting.  No diarrhea.   Genitourinary: Positive for abnormal discharge.  Musculoskeletal: Negative for back pain. Skin: Negative for rash. Neurological: Negative for headaches, focal weakness or numbness.  ____________________________________________   PHYSICAL EXAM:  VITAL SIGNS: ED Triage Vitals  Enc Vitals Group     BP 07/12/19 2157 (!) 158/79     Pulse Rate 07/12/19 2157 99     Resp 07/12/19 2157 16     Temp 07/12/19 2157 99.3 F (37.4 C)     Temp Source 07/12/19 2157 Oral     SpO2 07/12/19 2157 97 %  Weight --      Height --      Head Circumference --      Peak Flow --      Pain Score 07/12/19 2159 0   Constitutional: Alert and oriented.  Eyes: Conjunctivae are normal.  ENT      Head: Normocephalic and atraumatic.      Nose: No congestion/rhinnorhea.      Mouth/Throat: Mucous membranes are moist.      Neck: No stridor. Hematological/Lymphatic/Immunilogical: No cervical lymphadenopathy. Cardiovascular: Normal rate, regular rhythm.  No murmurs, rubs, or gallops.  Respiratory: Normal respiratory effort without  tachypnea nor retractions. Breath sounds are clear and equal bilaterally. No wheezes/rales/rhonchi. Gastrointestinal: Soft and non tender. No rebound. No guarding.  Genitourinary: Deferred Musculoskeletal: Normal range of motion in all extremities. No lower extremity edema. Neurologic:  Normal speech and language. No gross focal neurologic deficits are appreciated.  Skin:  Skin is warm, dry and intact. No rash noted. Psychiatric: Mood and affect are normal. Speech and behavior are normal. Patient exhibits appropriate insight and judgment.  ____________________________________________    LABS (pertinent positives/negatives)  POCT urine negative  ____________________________________________   EKG  None  ____________________________________________    RADIOLOGY  None  ____________________________________________   PROCEDURES  Procedures  ____________________________________________   INITIAL IMPRESSION / ASSESSMENT AND PLAN / ED COURSE  Pertinent labs & imaging results that were available during my care of the patient were reviewed by me and considered in my medical decision making (see chart for details).   Patient presented to the emergency department today because of concern for abnormal vaginal discharge. Patient had negative pregnancy test at home and was negative here. Discussed with patient that we could test for certain infections here. Did recommend she follow up for full STD panel. Patient however left the ED prior to collection of samples.  ____________________________________________   FINAL CLINICAL IMPRESSION(S) / ED DIAGNOSES  Final diagnoses:  Abnormal vaginal fluids     Note: This dictation was prepared with Dragon dictation. Any transcriptional errors that result from this process are unintentional     Nance Pear, MD 07/13/19 [phone]

## 2019-08-06 ENCOUNTER — Encounter: Payer: Self-pay | Admitting: Emergency Medicine

## 2019-08-06 ENCOUNTER — Other Ambulatory Visit: Payer: Self-pay

## 2019-08-06 DIAGNOSIS — Z5321 Procedure and treatment not carried out due to patient leaving prior to being seen by health care provider: Secondary | ICD-10-CM | POA: Insufficient documentation

## 2019-08-06 DIAGNOSIS — Z760 Encounter for issue of repeat prescription: Secondary | ICD-10-CM | POA: Diagnosis present

## 2019-08-06 LAB — POC URINE PREG, ED: Preg Test, Ur: NEGATIVE

## 2019-08-06 NOTE — ED Triage Notes (Signed)
Pt here for med refill of trazodone to help her sleep at night. States she has been out x3days ago. Pt also wants to be checked for STD, c/o dysuria. Pt states unprotected sex.

## 2019-08-07 ENCOUNTER — Emergency Department
Admission: EM | Admit: 2019-08-07 | Discharge: 2019-08-07 | Disposition: A | Discharge status: Home / Self Care | Payer: Medicaid Other | Attending: Emergency Medicine | Admitting: Emergency Medicine

## 2019-08-07 ENCOUNTER — Other Ambulatory Visit: Payer: Self-pay

## 2019-08-07 ENCOUNTER — Emergency Department
Admission: EM | Admit: 2019-08-07 | Discharge: 2019-08-07 | Disposition: A | Discharge status: Home / Self Care | Payer: Medicaid Other | Source: Home / Self Care | Attending: Student in an Organized Health Care Education/Training Program | Admitting: Student in an Organized Health Care Education/Training Program

## 2019-08-07 ENCOUNTER — Encounter: Payer: Self-pay | Admitting: Intensive Care

## 2019-08-07 DIAGNOSIS — F209 Schizophrenia, unspecified: Secondary | ICD-10-CM

## 2019-08-07 DIAGNOSIS — Z760 Encounter for issue of repeat prescription: Secondary | ICD-10-CM

## 2019-08-07 HISTORY — DX: Unspecified asthma, uncomplicated: J45.909

## 2019-08-07 MED ORDER — ARIPIPRAZOLE 15 MG PO TABS: 15.0000 mg | ORAL_TABLET | Freq: Every day | ORAL | 0 refills | Status: DC

## 2019-08-07 MED ORDER — ARIPIPRAZOLE 15 MG PO TABS
15.0000 mg | ORAL_TABLET | Freq: Once | ORAL | Status: DC
  Filled 2019-08-07: qty 1

## 2019-08-07 NOTE — ED Notes (Signed)
No answer when called several times from lobby & outside 

## 2019-08-07 NOTE — ED Triage Notes (Signed)
Patient reports she is here for medication refill. Was seen here yesterday and left before test results.

## 2019-08-07 NOTE — ED Provider Notes (Signed)
[facility] Emergency Department Provider Note ____________________________________________  Time seen: 1610  I have reviewed the triage vital signs and the nursing notes.  HISTORY  Chief Complaint  Medication Refill  HPI Tracy Poole is a 23 y.o. female presents itself to the ED with a requested medication refill.  Patient presented to the ED yesterday, bu left prior to evaluation. According to the triage note yesterday, she was requesting Trazadone and STD testing. Today she reports requesting a refill of her Abilify. The chart review and reveals the patient was last seen her inn April 2019 for a behavorial evaluation. There is no current record of her medications. She reports she was started on Ability and Invega injections by [facility] Porcupine [facility]en [facility] She also reports she has been non-compliant with [facility] and has not been seen since the beginning of the year. She apparently stopped receiving injections after the RN there transfered out.   I called to [facility] to inquire about the patient appointments. I was advised that the patient had not been seen in several months, and had not returned as expected. I was able to confirm the patient's RX meds.  Past Medical History:  Diagnosis Date  . Anxiety   . Asthma   . Depression     Patient Active Problem List   Diagnosis Date Noted  . Severe recurrent major depression with psychotic features (HCC) 03/26/2018  . Noncompliance 03/25/2018  . Pregnancy 09/29/2017  . Pregnancy affected by fetal growth restriction 09/05/2017  . Abnormal findings on antenatal screening   . Major depressive disorder, single episode, severe with psychotic features (HCC) 03/28/2017  . First trimester pregnancy 03/27/2017  . Schizophrenia spectrum disorder with psychotic disorder type not yet determined (HCC) 03/27/2017    Past Surgical History:  Procedure Laterality Date  . CESAREAN SECTION    . EUSTACHIAN TUBE  DILATION    . HERNIA REPAIR      Prior to Admission medications   Medication Sig Start Date End Date Taking? Authorizing Provider  ARIPiprazole (ABILIFY) 15 MG tablet Take 15 mg by mouth daily.   Yes [provider]  ARIPiprazole (ABILIFY) 15 MG tablet Take 1 tablet (15 mg total) by mouth at bedtime. 08/07/19 09/06/19  Neesha Langton, Charlesetta IvoryJenise V Bacon, PA-C  meloxicam (MOBIC) 15 MG tablet Take 1 tablet (15 mg total) by mouth daily. 07/15/18   Cuthriell, Delorise RoyalsJonathan D, PA-C  paliperidone (INVEGA SUSTENNA) 156 MG/ML SUSY injection Inject 1 mL (156 mg total) into the muscle every 28 (twenty-eight) days. 04/04/18   Clapacs, Jackquline DenmarkJohn T, MD    Allergies Patient has no known allergies.  Family History  Problem Relation Age of Onset  . Bladder Cancer Neg Hx   . Kidney cancer Neg Hx     Social History Social History   Tobacco Use  . Smoking status: Current Every Day Smoker    Packs/day: 1.00    Types: Cigarettes  . Smokeless tobacco: Never Used  Substance Use Topics  . Alcohol use: Yes    Comment: occasionallly  . Drug use: No    Review of Systems  Constitutional: Negative for fever. Eyes: Negative for visual changes. ENT: Negative for sore throat. Cardiovascular: Negative for chest pain. Respiratory: Negative for shortness of breath. Gastrointestinal: Negative for abdominal pain, vomiting and diarrhea. Genitourinary: Negative for dysuria. Musculoskeletal: Negative for back pain. Skin: Negative for rash. Neurological: Negative for headaches, focal weakness or numbness. Psychological: Denies SI/HI. No reported auditory or visual hallucinations.  ____________________________________________  PHYSICAL EXAM:  VITAL SIGNS: ED Triage Vitals [08/07/19 1539]  Enc Vitals Group     BP 120/63     Pulse Rate (!) 101     Resp 16     Temp 99.4 F (37.4 C)     Temp Source Oral     SpO2 99 %     Weight 274 lb 12.8 oz (124.6 kg)     Height 5\' 4"  (1.626 m)     Head Circumference      Peak  Flow      Pain Score 0     Pain Loc      Pain Edu?      Excl. in [place]?     Constitutional: Alert and oriented. Well appearing and in no distress. Head: Normocephalic and atraumatic. Eyes: Conjunctivae are normal. Normal extraocular movements Cardiovascular: Normal rate, regular rhythm. Normal distal pulses. Respiratory: Normal respiratory effort. No wheezes/rales/rhonchi. Gastrointestinal: Soft and nontender. No distention. Musculoskeletal: Nontender with normal range of motion in all extremities.  Neurologic:  Normal gait without ataxia. Normal speech and language. No gross focal neurologic deficits are appreciated. Skin:  Skin is warm, dry and intact. No rash noted. Psychiatric: Mood is calm and affect is flat. Patient exhibits appropriate insight and judgment. ____________________________________________  PROCEDURES  Procedures ____________________________________________  INITIAL IMPRESSION / ASSESSMENT AND PLAN / ED COURSE  Patient with ED evaluation medication refill.  Patient with a history of schizophrenia, presents requesting refills of her Abilify.  Patient has been poorly compliant with her counseling and medication refill appointments with [facility].  I was able to secure an appointment with the [facility] provider for the patient to be evaluated in approximately 30 days.  I agree with the patient that I would refill her medication for a period of 30 days if she agreed to keep the appointment as set.  Patient verbalized agreement with the plan, and is discharged at this time with a prescription for Abilify as requested.  The patient and mother discharged at this time with instruction to report on Monday for reassessment, and report again on October 8, for provider evaluation.  Tracy Poole was evaluated in Emergency Department on 08/07/2019 for the symptoms described in the history of present illness. She was evaluated in the context of the global COVID-19 pandemic, which necessitated  consideration that the patient might be at risk for infection with the SARS-CoV-2 virus that causes COVID-19. Institutional protocols and algorithms that pertain to the evaluation of patients at risk for COVID-19 are in a state of rapid change based on information released by regulatory bodies including the CDC and federal and state organizations. These policies and algorithms were followed during the patient's care in the ED.  I reviewed the patient's prescription history over the last 12 months in the multi-state controlled substances database(s) that includes [place], [place], [place], [place], [place], [place], [place], [place], [place], [place], [place], [place], [place], and [place].  Results were notable for no current RX. ____________________________________________  FINAL CLINICAL IMPRESSION(S) / ED DIAGNOSES  Final diagnoses:  Encounter for medication refill  Schizophrenia, unspecified type ([place])      Christen Wardrop, Dannielle Karvonen, PA-C 08/07/19 1731    Duffy Bruce, MD 08/11/19 0945

## 2019-08-07 NOTE — Discharge Instructions (Addendum)
Take your medicine as prescribed. You must keep your appointments for evaluation and medicine refills at [facility] as scheduled. Report on Monday, Sept 14 for pre-assessment and paperwork. Report Thursday, October 8th at 4:20 pm for your provider assessment.

## 2019-08-07 NOTE — ED Notes (Signed)
See triage note  States she is here for med refill. States she has been out of meds for about 2 months  Also states she was here last pm and wanted STD results

## 2019-08-12 ENCOUNTER — Other Ambulatory Visit: Payer: Self-pay

## 2019-08-12 ENCOUNTER — Encounter: Payer: Self-pay | Admitting: Emergency Medicine

## 2019-08-12 ENCOUNTER — Emergency Department
Admission: EM | Admit: 2019-08-12 | Discharge: 2019-08-12 | Disposition: A | Discharge status: Home / Self Care | Payer: Medicaid Other | Attending: Emergency Medicine | Admitting: Emergency Medicine

## 2019-08-12 DIAGNOSIS — T50905A Adverse effect of unspecified drugs, medicaments and biological substances, initial encounter: Secondary | ICD-10-CM

## 2019-08-12 DIAGNOSIS — F17210 Nicotine dependence, cigarettes, uncomplicated: Secondary | ICD-10-CM | POA: Insufficient documentation

## 2019-08-12 DIAGNOSIS — T43595A Adverse effect of other antipsychotics and neuroleptics, initial encounter: Secondary | ICD-10-CM | POA: Insufficient documentation

## 2019-08-12 DIAGNOSIS — J45909 Unspecified asthma, uncomplicated: Secondary | ICD-10-CM | POA: Diagnosis not present

## 2019-08-12 DIAGNOSIS — Z79899 Other long term (current) drug therapy: Secondary | ICD-10-CM | POA: Insufficient documentation

## 2019-08-12 DIAGNOSIS — R400 Somnolence: Secondary | ICD-10-CM | POA: Insufficient documentation

## 2019-08-12 DIAGNOSIS — Z791 Long term (current) use of non-steroidal anti-inflammatories (NSAID): Secondary | ICD-10-CM | POA: Diagnosis not present

## 2019-08-12 MED ORDER — ABILIFY 5 MG PO TABS: 5.0000 mg | ORAL_TABLET | Freq: Every day | ORAL | 0 refills | Status: DC

## 2019-08-12 MED ORDER — ARIPIPRAZOLE 5 MG PO TABS: 5.0000 mg | ORAL_TABLET | Freq: Every day | ORAL | 0 refills | Status: DC

## 2019-08-12 NOTE — ED Triage Notes (Signed)
Patient ambulatory to triage with steady gait, without difficulty or distress noted, mask in place; pt reports rx aripiprazole 15mg  9/11 and is supposed to be taking "abilify"; explained to pt that this is the generic form but pt reports its making her "feel weird"

## 2019-08-12 NOTE — ED Provider Notes (Signed)
[facility] Emergency Department Provider Note  ____________________________________________   First MD Initiated Contact with Patient 08/12/19 [phone]     (approximate)  I have reviewed the triage vital signs and the nursing notes.   HISTORY  Chief Complaint Medication Reaction    HPI Tracy Poole is a 23 y.o. female  With h/o asthma, anxiety, depression here with adverse reaction to medication. Pt was just recently restarted on ABilify 15 mg. She states she received the generic and a higher dose than she had used previously. Since then, she's been drowsy and "out of it," which she is unable to further describe. She felt like she was "up and down"  All night while taking it. She has felt out of it throughout the day. Denis any paresthesias or akathisias. No facial twitching or involuntary movements. No dizziness upon standing or lightheadedness. No other issues. Pt states she has not had any worsening depression, SI, AVH. Feels like she just needs a lower dose.        Past Medical History:  Diagnosis Date  . Anxiety   . Asthma   . Depression     Patient Active Problem List   Diagnosis Date Noted  . Severe recurrent major depression with psychotic features ([place]) 03/26/2018  . Noncompliance 03/25/2018  . Pregnancy 09/29/2017  . Pregnancy affected by fetal growth restriction 09/05/2017  . Abnormal findings on antenatal screening   . Major depressive disorder, single episode, severe with psychotic features ([place]) 03/28/2017  . First trimester pregnancy 03/27/2017  . Schizophrenia spectrum disorder with psychotic disorder type not yet determined ([place]) 03/27/2017    Past Surgical History:  Procedure Laterality Date  . CESAREAN SECTION    . EUSTACHIAN TUBE DILATION    . HERNIA REPAIR      Prior to Admission medications   Medication Sig Start Date End Date Taking? Authorizing Provider  ABILIFY 5 MG tablet Take 1 tablet (5 mg total) by mouth daily  for 14 days. 08/12/19 08/26/19  Duffy Bruce, MD  meloxicam (MOBIC) 15 MG tablet Take 1 tablet (15 mg total) by mouth daily. 07/15/18   Cuthriell, Charline Bills, PA-C  paliperidone (INVEGA SUSTENNA) 156 MG/ML SUSY injection Inject 1 mL (156 mg total) into the muscle every 28 (twenty-eight) days. 04/04/18   Clapacs, [name], MD    Allergies Patient has no known allergies.  Family History  Problem Relation Age of Onset  . Bladder Cancer Neg Hx   . Kidney cancer Neg Hx     Social History Social History   Tobacco Use  . Smoking status: Current Every Day Smoker    Packs/day: 1.00    Types: Cigarettes  . Smokeless tobacco: Never Used  Substance Use Topics  . Alcohol use: Yes    Comment: occasionallly  . Drug use: No    Review of Systems  Review of Systems  Constitutional: Negative for fatigue and fever.  HENT: Negative for congestion and sore throat.   Eyes: Negative for visual disturbance.  Respiratory: Negative for cough and shortness of breath.   Cardiovascular: Negative for chest pain.  Gastrointestinal: Negative for abdominal pain, diarrhea, nausea and vomiting.  Genitourinary: Negative for flank pain.  Musculoskeletal: Negative for back pain and neck pain.  Skin: Negative for rash and wound.  Neurological: Negative for weakness.     ____________________________________________  PHYSICAL EXAM:      VITAL SIGNS: ED Triage Vitals  Enc Vitals Group     BP 08/12/19 0649 140/84  Pulse Rate 08/12/19 0651 96     Resp 08/12/19 0700 16     Temp --      Temp src --      SpO2 08/12/19 0651 100 %     Weight 08/12/19 0419 247 lb (112 kg)     Height 08/12/19 0419 5\' 4"  (1.626 m)     Head Circumference --      Peak Flow --      Pain Score 08/12/19 0418 0     Pain Loc --      Pain Edu? --      Excl. in [place]? --      Physical Exam Vitals signs and nursing note reviewed.  Constitutional:      General: She is not in acute distress.    Appearance: She is well-developed.   HENT:     Head: Normocephalic and atraumatic.  Eyes:     Conjunctiva/sclera: Conjunctivae normal.  Neck:     Musculoskeletal: Neck supple.  Cardiovascular:     Rate and Rhythm: Normal rate and regular rhythm.     Heart sounds: Normal heart sounds.  Pulmonary:     Effort: Pulmonary effort is normal. No respiratory distress.     Breath sounds: No wheezing.  Abdominal:     General: There is no distension.  Skin:    General: Skin is warm.     Capillary Refill: Capillary refill takes less than 2 seconds.     Findings: No rash.  Neurological:     Mental Status: She is alert and oriented to person, place, and time.     Motor: No abnormal muscle tone.  Psychiatric:     Comments: No SI, HI, AVH.       ____________________________________________   LABS (all labs ordered are listed, but only abnormal results are displayed)  Labs Reviewed - No data to display  ____________________________________________  EKG: None ________________________________________  RADIOLOGY All imaging, including plain films, CT scans, and ultrasounds, independently reviewed by me, and interpretations confirmed via formal radiology reads.  ED MD interpretation:   None  Official radiology report(s): No results found.  ____________________________________________  PROCEDURES   Procedure(s) performed (including Critical Care):  Procedures  ____________________________________________  INITIAL IMPRESSION / MDM / ASSESSMENT AND PLAN / ED COURSE  As part of my medical decision making, I reviewed the following data within the electronic MEDICAL RECORD NUMBER Notes from prior ED visits and [place] Controlled Substance Database      *Swayze Vella RedheadLatoni Hiebert was evaluated in Emergency Department on 08/12/2019 for the symptoms described in the history of present illness. She was evaluated in the context of the global COVID-19 pandemic, which necessitated consideration that the patient might be at risk for  infection with the SARS-CoV-2 virus that causes COVID-19. Institutional protocols and algorithms that pertain to the evaluation of patients at risk for COVID-19 are in a state of rapid change based on information released by regulatory bodies including the CDC and federal and state organizations. These policies and algorithms were followed during the patient's care in the ED.  Some ED evaluations and interventions may be delayed as a result of limited staffing during the pandemic.*      Medical Decision Making: 23 yo F here with likely over sedation in setting of restarting a higher dose of abilify after 2 months without it. Denies any dizziness, lightheadedness, postural hypotension symptoms. No akathisias or TD. No HI, SI, or hallucinations. Will restart her lower dose. VSS, pt ow well  without medical complaints. I've also asked for brand rx as she is adamant this has helped her more in the past. Restart at 5 mg with f/u with psychiatrist this week.  ____________________________________________  FINAL CLINICAL IMPRESSION(S) / ED DIAGNOSES  Final diagnoses:  Adverse effect of drug, initial encounter     MEDICATIONS GIVEN DURING THIS VISIT:  Medications - No data to display   ED Discharge Orders         Ordered    ARIPiprazole (ABILIFY) 5 MG tablet  Daily at bedtime,   Status:  Discontinued     08/12/19 0740    ABILIFY 5 MG tablet  Daily     08/12/19 0742           Note:  This document was prepared using Dragon voice recognition software and may include unintentional dictation errors.   Shaune Pollack, MD 08/12/19 [phone]

## 2019-08-12 NOTE — Discharge Instructions (Addendum)
FOR NOW, STOP TAKING THE 15 MG TABLETS.  I HAVE PRESCRIBED TWO WEEKS OF 5 MG TABLETS. YOU CAN START THE LOWER DOSE TONIGHT.  TAKE ONLY THE ABILIFY 5 MG TABLET TONIGHT AND EVERY NIGHT UNTIL YOU SEE YOUR DOCTOR. STOP THE 15 MG, AS THIS DOSE IS TOO HIGH FOR YOU RIGHT NOW.  YOU CAN TAKE BENADRYL 25 MG WITH IT IF IT MAKES YOU ANXIOUS OR "JUMPY"

## 2019-08-12 NOTE — ED Notes (Signed)
Warm blanket given. Patient updated on wait time.

## 2019-08-12 NOTE — ED Notes (Signed)
Warm blanket given upon request

## 2019-08-19 ENCOUNTER — Other Ambulatory Visit: Payer: Self-pay

## 2019-08-19 DIAGNOSIS — Z79899 Other long term (current) drug therapy: Secondary | ICD-10-CM | POA: Insufficient documentation

## 2019-08-19 DIAGNOSIS — J45909 Unspecified asthma, uncomplicated: Secondary | ICD-10-CM | POA: Diagnosis not present

## 2019-08-19 DIAGNOSIS — F209 Schizophrenia, unspecified: Secondary | ICD-10-CM | POA: Diagnosis not present

## 2019-08-19 DIAGNOSIS — F17210 Nicotine dependence, cigarettes, uncomplicated: Secondary | ICD-10-CM | POA: Diagnosis not present

## 2019-08-19 DIAGNOSIS — Z7689 Persons encountering health services in other specified circumstances: Secondary | ICD-10-CM | POA: Diagnosis present

## 2019-08-19 NOTE — ED Triage Notes (Signed)
Patient reports she would like to have her medications changed.

## 2019-08-20 ENCOUNTER — Emergency Department
Admission: EM | Admit: 2019-08-20 | Discharge: 2019-08-20 | Disposition: A | Discharge status: Home / Self Care | Payer: Medicaid Other | Attending: Emergency Medicine | Admitting: Emergency Medicine

## 2019-08-20 DIAGNOSIS — F200 Paranoid schizophrenia: Secondary | ICD-10-CM

## 2019-08-20 DIAGNOSIS — Z79899 Other long term (current) drug therapy: Secondary | ICD-10-CM

## 2019-08-20 DIAGNOSIS — F209 Schizophrenia, unspecified: Secondary | ICD-10-CM

## 2019-08-20 NOTE — Consult Note (Signed)
[facility] Face-to-Face Psychiatry Consult   Reason for Consult: Medication change Referring Physician: Dr. Dolores Frame Patient Identification: Tracy Poole MRN:  [id number] Principal Diagnosis: <principal problem not specified> Diagnosis:  Active Problems:   * No active hospital problems. *   Total Time spent with patient: 45 minutes  Subjective:   Tracy Poole is a 23 y.o. female patient presented to [facility] ED via POV for medication change. The patient states, "I came and to get my medication changed."  She says the 15 mg of Abilify is not "treating me right."  She states she did not attend her follow-up appointment with [facility] and decided to come to the emergency room to adjust her medication.  It was discussed with the patient that the ED does not adjust people's medications and her primary care provider should be the one to do it.  Education provided to the patient that if the 15 mg of Abilify is too "strong for her."  She should call her provider, or she might want to consider taking half of the pill. The patient was seen face-to-face by this provider; the chart reviewed and consulted with Dr. Dolores Frame on 08/20/2019 due to the patient's care. It was discussed with the EDP that the patient does not meet the criteria to be admitted to the psychiatric inpatient unit.  The patient is alert and oriented x3, calm, cooperative, and mood-congruent with an affect on evaluation. The patient does not appear to be responding to internal or external stimuli. Neither is the patient presenting with any delusional thinking. The patient denies auditory or visual hallucinations. The patient denies any suicidal, homicidal, or self-harm ideations. The patient is not presenting with any psychotic or paranoid behaviors. During an encounter with the patient, she was able to answer questions appropriately.   Plan: The patient is not a safety risk to self or others and does not require psychiatric inpatient admission for  stabilization and treatment.  HPI: Per Dr. Dolores Frame; Tracy Poole is a 23 y.o. female who returns to the ED from home wanting to have her Abilify dosage changed again.  Patient's was seen initially in the ED on 9/11 with a request to restart her Abilify.  This was restarted at 50 mg.  She came back on 9/16 stating 15 mg dose was too much and she felt like she was being oversedated.  In the interim, the ED provider confirmed that patient has not kept her [facility] appointments for the past several months.  Tonight she feels like the 5 mg dose that was started on 9/16 is not enough.  Brings her toddler with her in the middle of the night.  Denies active SI/HI/AH/VH.  Voices no medical complaints.  Past Psychiatric History:  Anxiety Depression  Risk to Self:  No Risk to Others:  No Prior Inpatient Therapy:  No Prior Outpatient Therapy:  Yes  Past Medical History:  Past Medical History:  Diagnosis Date  . Anxiety   . Asthma   . Depression     Past Surgical History:  Procedure Laterality Date  . CESAREAN SECTION    . EUSTACHIAN TUBE DILATION    . HERNIA REPAIR     Family History:  Family History  Problem Relation Age of Onset  . Bladder Cancer Neg Hx   . Kidney cancer Neg Hx    Family Psychiatric  History:  Social History:  Social History   Substance and Sexual Activity  Alcohol Use Yes   Comment: occasionallly  Social History   Substance and Sexual Activity  Drug Use No    Social History   Socioeconomic History  . Marital status: Single    Spouse name: Not on file  . Number of children: Not on file  . Years of education: Not on file  . Highest education level: Not on file  Occupational History  . Not on file  Social Needs  . Financial resource strain: Not on file  . Food insecurity    Worry: Not on file    Inability: Not on file  . Transportation needs    Medical: Not on file    Non-medical: Not on file  Tobacco Use  . Smoking status: Current Every Day Smoker     Packs/day: 1.00    Types: Cigarettes  . Smokeless tobacco: Never Used  Substance and Sexual Activity  . Alcohol use: Yes    Comment: occasionallly  . Drug use: No  . Sexual activity: Yes    Birth control/protection: None  Lifestyle  . Physical activity    Days per week: Not on file    Minutes per session: Not on file  . Stress: Not on file  Relationships  . Social Musicianconnections    Talks on phone: Not on file    Gets together: Not on file    Attends religious service: Not on file    Active member of club or organization: Not on file    Attends meetings of clubs or organizations: Not on file    Relationship status: Not on file  Other Topics Concern  . Not on file  Social History Narrative  . Not on file   Additional Social History:    Allergies:  No Known Allergies  Labs: No results found for this or any previous visit (from the past 48 hour(s)).  No current facility-administered medications for this encounter.    Current Outpatient Medications  Medication Sig Dispense Refill  . ABILIFY 5 MG tablet Take 1 tablet (5 mg total) by mouth daily for 14 days. 14 tablet 0  . meloxicam (MOBIC) 15 MG tablet Take 1 tablet (15 mg total) by mouth daily. 30 tablet 0  . paliperidone (INVEGA SUSTENNA) 156 MG/ML SUSY injection Inject 1 mL (156 mg total) into the muscle every 28 (twenty-eight) days. 1 mL 2    Musculoskeletal: Strength & Muscle Tone: within normal limits Gait & Station: normal Patient leans: N/A  Psychiatric Specialty Exam: Physical Exam  Nursing note and vitals reviewed. Constitutional: She is oriented to person, place, and time. She appears well-developed.  Neck: Normal range of motion. Neck supple.  Cardiovascular: Normal rate.  Respiratory: Effort normal.  Musculoskeletal: Normal range of motion.  Neurological: She is alert and oriented to person, place, and time.    Review of Systems  Psychiatric/Behavioral: Positive for depression. The patient has  insomnia.   All other systems reviewed and are negative.   Blood pressure 110/69, pulse 89, temperature 98.2 F (36.8 C), temperature source Oral, resp. rate 16, height 5\' 4"  (1.626 m), weight 108.9 kg, SpO2 100 %.Body mass index is 41.2 kg/m.  General Appearance: Casual  Eye Contact:  Fair  Speech:  Clear and Coherent  Volume:  Normal  Mood:  Depressed  Affect:  Depressed  Thought Process:  Coherent  Orientation:  Full (Time, Place, and Person)  Thought Content:  Logical  Suicidal Thoughts:  No  Homicidal Thoughts:  No  Memory:  Immediate;   Good Recent;   Good Remote;  Good  Judgement:  Fair  Insight:  Lacking  Psychomotor Activity:  Normal  Concentration:  Concentration: Good and Attention Span: Good  Recall:  Good  Fund of Knowledge:  Good  Language:  Good  Akathisia:  Negative  Handed:  Right  AIMS (if indicated):     Assets:  Communication Skills Leisure Time Social Support  ADL's:  Intact  Cognition:  WNL  Sleep:   Insomnia     Treatment Plan Summary: Plan Patient does not meet criteria for psychiatric inpatient admission.  Disposition: No evidence of imminent risk to self or others at present.   Patient does not meet criteria for psychiatric inpatient admission. Supportive therapy provided about ongoing stressors. Discussed crisis plan, support from social network, calling 911, coming to the Emergency Department, and calling Suicide Hotline.  Caroline Sauger, NP 08/20/2019 7:07 AM

## 2019-08-20 NOTE — Discharge Instructions (Addendum)
Stick with your Abilify at the current dosage. You must see [facility] for follow-up. Return to the ER for worsening symptoms, feelings of hurting yourself or others, or other concerns.

## 2019-08-20 NOTE — ED Provider Notes (Signed)
[facility] Emergency Department Provider Note   ____________________________________________   First MD Initiated Contact with Patient 08/20/19 0145     (approximate)  I have reviewed the triage vital signs and the nursing notes.   HISTORY  Chief Complaint Medication Change    HPI Tracy Poole is a 23 y.o. female who returns to the ED from home wanting to have her Abilify dosage changed again.  Patient's was seen initially in the ED on 9/11 with a request to restart her Abilify.  This was restarted at 50 mg.  She came back on 9/16 stating 15 mg dose was too much and she felt like she was being oversedated.  In the interim, the ED provider confirmed that patient has not kept her [facility] appointments for the past several months.  Tonight she feels like the 5 mg dose that was started on 9/16 is not enough.  Brings her toddler with her in the middle of the night.  Denies active SI/HI/AH/VH.  Voices no medical complaints.       Past Medical History:  Diagnosis Date   Anxiety    Asthma    Depression     Patient Active Problem List   Diagnosis Date Noted   Severe recurrent major depression with psychotic features ([place]) 03/26/2018   Noncompliance 03/25/2018   Pregnancy 09/29/2017   Pregnancy affected by fetal growth restriction 09/05/2017   Abnormal findings on antenatal screening    Major depressive disorder, single episode, severe with psychotic features ([place]) 03/28/2017   First trimester pregnancy 03/27/2017   Schizophrenia spectrum disorder with psychotic disorder type not yet determined ([place]) 03/27/2017    Past Surgical History:  Procedure Laterality Date   CESAREAN SECTION     EUSTACHIAN TUBE [place]      Prior to Admission medications   Medication Sig Start Date End Date Taking? Authorizing Provider  ABILIFY 5 MG tablet Take 1 tablet (5 mg total) by mouth daily for 14 days. 08/12/19 08/26/19  Duffy Bruce,  MD  meloxicam (MOBIC) 15 MG tablet Take 1 tablet (15 mg total) by mouth daily. 07/15/18   Cuthriell, Charline Bills, PA-C  paliperidone (INVEGA SUSTENNA) 156 MG/ML SUSY injection Inject 1 mL (156 mg total) into the muscle every 28 (twenty-eight) days. 04/04/18   Clapacs, [name], MD    Allergies Patient has no known allergies.  Family History  Problem Relation Age of Onset   Bladder Cancer Neg Hx    Kidney cancer Neg Hx     Social History Social History   Tobacco Use   Smoking status: Current Every Day Smoker    Packs/day: 1.00    Types: Cigarettes   Smokeless tobacco: Never Used  Substance Use Topics   Alcohol use: Yes    Comment: occasionallly   Drug use: No    Review of Systems  Constitutional: No fever/chills Eyes: No visual changes. ENT: No sore throat. Cardiovascular: Denies chest pain. Respiratory: Denies shortness of breath. Gastrointestinal: No abdominal pain.  No nausea, no vomiting.  No diarrhea.  No constipation. Genitourinary: Negative for dysuria. Musculoskeletal: Negative for back pain. Skin: Negative for rash. Neurological: Negative for headaches, focal weakness or numbness. Psychiatric:  Positive for medication change request.  ____________________________________________   PHYSICAL EXAM:  VITAL SIGNS: ED Triage Vitals  Enc Vitals Group     BP 08/19/19 2227 134/66     Pulse Rate 08/19/19 2227 96     Resp 08/19/19 2227  18     Temp 08/19/19 2227 98.7 F (37.1 C)     Temp Source 08/19/19 2227 Oral     SpO2 08/19/19 2227 98 %     Weight 08/19/19 2225 240 lb (108.9 kg)     Height 08/19/19 2225 5\' 4"  (1.626 m)     Head Circumference --      Peak Flow --      Pain Score --      Pain Loc --      Pain Edu? --      Excl. in GC? --     Constitutional: Alert and oriented. Well appearing and in no acute distress.  No mask in place.  Smiling and playing with her daughter. Eyes: Conjunctivae are normal. PERRL. EOMI. Head: Atraumatic. Nose: No  congestion/rhinnorhea. Mouth/Throat: Mucous membranes are moist.  Oropharynx non-erythematous. Neck: No stridor.   Cardiovascular: Normal rate, regular rhythm. Grossly normal heart sounds.  Good peripheral circulation. Respiratory: Normal respiratory effort.  No retractions. Lungs CTAB. Gastrointestinal: Soft and nontender. No distention. No abdominal bruits. No CVA tenderness. Musculoskeletal: No lower extremity tenderness nor edema.  No joint effusions. Neurologic:  Normal speech and language. No gross focal neurologic deficits are appreciated. No gait instability. Skin:  Skin is warm, dry and intact. No rash noted. Psychiatric: Mood and affect are normal. Speech and behavior are normal.  ____________________________________________   LABS (all labs ordered are listed, but only abnormal results are displayed)  Labs Reviewed - No data to display ____________________________________________  EKG  None ____________________________________________  RADIOLOGY  ED MD interpretation: None  Official radiology report(s): No results found.  ____________________________________________   PROCEDURES  Procedure(s) performed (including Critical Care):  Procedures   ____________________________________________   INITIAL IMPRESSION / ASSESSMENT AND PLAN / ED COURSE  As part of my medical decision making, I reviewed the following data within the electronic MEDICAL RECORD NUMBER Nursing notes reviewed and incorporated, Old chart reviewed and Notes from prior ED visits     Tracy Poole was evaluated in Emergency Department on 08/20/2019 for the symptoms described in the history of present illness. She was evaluated in the context of the global COVID-19 pandemic, which necessitated consideration that the patient might be at risk for infection with the SARS-CoV-2 virus that causes COVID-19. Institutional protocols and algorithms that pertain to the evaluation of patients at risk for  COVID-19 are in a state of rapid change based on information released by regulatory bodies including the CDC and federal and state organizations. These policies and algorithms were followed during the patient's care in the ED.    23 year old female with schizophrenia who presents with her third ED visit for adjustment of her Abilify.  Admits she has not kept her [facility] appointment which was recently scheduled for her.  She is not suicidal or homicidal.  There is no clinical evidence for psychosis.  I educated the patient that the ED is not the place to keep adjusting her psychiatric medications as we cannot follow her long-term.  Will asked psychiatric nurse practitioner to speak with patient for medication recommendations.   Clinical Course as of Aug 19 306  Thu Aug 20, 2019  0303 Patient was seen by psychiatric nurse practitioner who encouraged her to stick with her current dose as it takes several weeks to reach therapeutic levels.  If needed, she can go up to 10 mg.  Patient is strongly encouraged to keep her appointments at [facility].  Strict return precautions given.  Patient verbalizes  understanding agrees with plan of care.   [JS]    Clinical Course User Index [JS] Irean HongSung, Avyon Herendeen J, MD     ____________________________________________   FINAL CLINICAL IMPRESSION(S) / ED DIAGNOSES  Final diagnoses:  Schizophrenia, unspecified type [facility](HCC)  Medication therapy continued     ED Discharge Orders    None       Note:  This document was prepared using Dragon voice recognition software and may include unintentional dictation errors.   Irean HongSung, Erick Murin J, MD 08/20/19 [phone]

## 2019-08-20 NOTE — ED Notes (Signed)
@   BS w/ Dr. Beather Arbour. Pt states her medication, which was changed a week ago, needs to be changed again because it wasn't strong enough. Pt has not kept her F/U appts w/ [facility].

## 2019-08-23 ENCOUNTER — Emergency Department
Admission: EM | Admit: 2019-08-23 | Discharge: 2019-08-23 | Discharge status: Against Medical Advice | Payer: Medicaid Other

## 2019-08-23 ENCOUNTER — Other Ambulatory Visit: Payer: Self-pay

## 2019-08-23 NOTE — ED Notes (Signed)
This RN started triaging pt. Pt stated that she "wanted to get a shot". When asked what she wanted to get a shot for pt stated that she did not think her medication was working but would not say which medication. RN opened medication tab and saw that pt takes Abilify, RN asked pt if that was the medication that she felt was not working and she said yes. Pt then got up and walked over to the trash can and spit in the trash can then walked out the door. Pt observed walking to the parking lot.

## 2019-08-25 ENCOUNTER — Other Ambulatory Visit: Payer: Self-pay

## 2019-08-25 ENCOUNTER — Inpatient Hospital Stay (HOSPITAL_COMMUNITY)
Admission: AD | Admit: 2019-08-25 | Discharge: 2019-08-28 | DRG: 885 | Disposition: A | Discharge status: Home / Self Care | Payer: Medicaid Other | Attending: Psychiatry | Admitting: Psychiatry

## 2019-08-25 ENCOUNTER — Emergency Department
Admission: EM | Admit: 2019-08-25 | Discharge: 2019-08-25 | Disposition: A | Discharge status: Other Acute Inpatient Hospital | Payer: Medicaid Other | Attending: Emergency Medicine | Admitting: Emergency Medicine

## 2019-08-25 ENCOUNTER — Encounter (HOSPITAL_COMMUNITY): Payer: Self-pay

## 2019-08-25 ENCOUNTER — Encounter: Payer: Self-pay | Admitting: Emergency Medicine

## 2019-08-25 DIAGNOSIS — Z79899 Other long term (current) drug therapy: Secondary | ICD-10-CM | POA: Diagnosis not present

## 2019-08-25 DIAGNOSIS — F200 Paranoid schizophrenia: Secondary | ICD-10-CM | POA: Diagnosis present

## 2019-08-25 DIAGNOSIS — Z20828 Contact with and (suspected) exposure to other viral communicable diseases: Secondary | ICD-10-CM | POA: Insufficient documentation

## 2019-08-25 DIAGNOSIS — F062 Psychotic disorder with delusions due to known physiological condition: Secondary | ICD-10-CM | POA: Diagnosis not present

## 2019-08-25 DIAGNOSIS — R42 Dizziness and giddiness: Secondary | ICD-10-CM | POA: Diagnosis not present

## 2019-08-25 DIAGNOSIS — R440 Auditory hallucinations: Secondary | ICD-10-CM

## 2019-08-25 DIAGNOSIS — F1210 Cannabis abuse, uncomplicated: Secondary | ICD-10-CM

## 2019-08-25 DIAGNOSIS — F209 Schizophrenia, unspecified: Secondary | ICD-10-CM

## 2019-08-25 DIAGNOSIS — F17210 Nicotine dependence, cigarettes, uncomplicated: Secondary | ICD-10-CM | POA: Diagnosis present

## 2019-08-25 DIAGNOSIS — G4700 Insomnia, unspecified: Secondary | ICD-10-CM | POA: Diagnosis present

## 2019-08-25 DIAGNOSIS — J45909 Unspecified asthma, uncomplicated: Secondary | ICD-10-CM | POA: Diagnosis present

## 2019-08-25 DIAGNOSIS — F4010 Social phobia, unspecified: Secondary | ICD-10-CM | POA: Diagnosis present

## 2019-08-25 DIAGNOSIS — N390 Urinary tract infection, site not specified: Secondary | ICD-10-CM | POA: Diagnosis present

## 2019-08-25 DIAGNOSIS — F29 Unspecified psychosis not due to a substance or known physiological condition: Secondary | ICD-10-CM | POA: Diagnosis not present

## 2019-08-25 DIAGNOSIS — F419 Anxiety disorder, unspecified: Secondary | ICD-10-CM | POA: Diagnosis present

## 2019-08-25 DIAGNOSIS — Z9114 Patient's other noncompliance with medication regimen: Secondary | ICD-10-CM

## 2019-08-25 DIAGNOSIS — R441 Visual hallucinations: Secondary | ICD-10-CM

## 2019-08-25 DIAGNOSIS — F3160 Bipolar disorder, current episode mixed, unspecified: Secondary | ICD-10-CM | POA: Diagnosis present

## 2019-08-25 DIAGNOSIS — Z046 Encounter for general psychiatric examination, requested by authority: Secondary | ICD-10-CM | POA: Diagnosis present

## 2019-08-25 LAB — COMPREHENSIVE METABOLIC PANEL
ALT: 52 U/L — ABNORMAL HIGH (ref 0–44)
AST: 32 U/L (ref 15–41)
Albumin: 4 g/dL (ref 3.5–5.0)
Alkaline Phosphatase: 93 U/L (ref 38–126)
Anion gap: 9 (ref 5–15)
BUN: 5 mg/dL — ABNORMAL LOW (ref 6–20)
CO2: 24 mmol/L (ref 22–32)
Calcium: 9 mg/dL (ref 8.9–10.3)
Chloride: 104 mmol/L (ref 98–111)
Creatinine, Ser: 0.8 mg/dL (ref 0.44–1.00)
GFR calc Af Amer: >60 mL/min (ref >60)
GFR calc non Af Amer: >60 mL/min (ref >60)
Glucose, Bld: 100 mg/dL — ABNORMAL HIGH (ref 70–99)
Potassium: 3.3 mmol/L — ABNORMAL LOW (ref 3.5–5.1)
Sodium: 137 mmol/L (ref 135–145)
Total Bilirubin: 1.1 mg/dL (ref 0.3–1.2)
Total Protein: 7.4 g/dL (ref 6.5–8.1)

## 2019-08-25 LAB — CBC
HCT: 40 % (ref 36.0–46.0)
Hemoglobin: 13.4 g/dL (ref 12.0–15.0)
MCH: 28.8 pg (ref 26.0–34.0)
MCHC: 33.5 g/dL (ref 30.0–36.0)
MCV: 86 fL (ref 80.0–100.0)
Platelets: 203 10*3/uL (ref 150–400)
RBC: 4.65 MIL/uL (ref 3.87–5.11)
RDW: 13.5 % (ref 11.5–15.5)
WBC: 12.3 10*3/uL — ABNORMAL HIGH (ref 4.0–10.5)
nRBC: 0 % (ref 0.0–0.2)

## 2019-08-25 LAB — URINE DRUG SCREEN, QUALITATIVE (ARMC ONLY)
Amphetamines, Ur Screen: NOT DETECTED
Barbiturates, Ur Screen: NOT DETECTED
Benzodiazepine, Ur Scrn: NOT DETECTED
Cannabinoid 50 Ng, Ur ~~LOC~~: POSITIVE — AB
Cocaine Metabolite,Ur ~~LOC~~: NOT DETECTED
MDMA (Ecstasy)Ur Screen: NOT DETECTED
Methadone Scn, Ur: NOT DETECTED
Opiate, Ur Screen: NOT DETECTED
Phencyclidine (PCP) Ur S: NOT DETECTED
Tricyclic, Ur Screen: NOT DETECTED

## 2019-08-25 LAB — SALICYLATE LEVEL: Salicylate Lvl: <7 mg/dL (ref 2.8–30.0)

## 2019-08-25 LAB — ACETAMINOPHEN LEVEL: Acetaminophen (Tylenol), Serum: <10 ug/mL — ABNORMAL LOW (ref 10–30)

## 2019-08-25 LAB — ETHANOL: Alcohol, Ethyl (B): <10 mg/dL (ref <10)

## 2019-08-25 LAB — SARS CORONAVIRUS 2 BY RT PCR (HOSPITAL ORDER, PERFORMED IN ~~LOC~~ HOSPITAL LAB): SARS Coronavirus 2: NEGATIVE

## 2019-08-25 LAB — POCT PREGNANCY, URINE: Preg Test, Ur: NEGATIVE

## 2019-08-25 MED ORDER — OMEGA-3-ACID ETHYL ESTERS 1 G PO CAPS
1.0000 g | ORAL_CAPSULE | Freq: Two times a day (BID) | ORAL | Status: DC
  Administered 2019-08-25 – 2019-08-28 (×5): 1 g via ORAL
  Filled 2019-08-25 (×9): qty 1

## 2019-08-25 MED ORDER — ALUM & MAG HYDROXIDE-SIMETH 200-200-20 MG/5ML PO SUSP: 30.0000 mL | ORAL | Status: DC | PRN

## 2019-08-25 MED ORDER — GABAPENTIN 300 MG PO CAPS
300.0000 mg | ORAL_CAPSULE | Freq: Three times a day (TID) | ORAL | Status: DC
  Administered 2019-08-25 – 2019-08-28 (×7): 300 mg via ORAL
  Filled 2019-08-25 (×12): qty 1

## 2019-08-25 MED ORDER — BENZTROPINE MESYLATE 0.5 MG PO TABS
0.5000 mg | ORAL_TABLET | Freq: Two times a day (BID) | ORAL | Status: DC
  Administered 2019-08-25 – 2019-08-28 (×4): 0.5 mg via ORAL
  Filled 2019-08-25 (×11): qty 1

## 2019-08-25 MED ORDER — LORAZEPAM 1 MG PO TABS: 1.0000 mg | ORAL_TABLET | ORAL | Status: DC | PRN

## 2019-08-25 MED ORDER — RISPERIDONE 3 MG PO TABS
3.0000 mg | ORAL_TABLET | Freq: Two times a day (BID) | ORAL | Status: DC
  Administered 2019-08-25 – 2019-08-26 (×2): 3 mg via ORAL
  Filled 2019-08-25 (×7): qty 1

## 2019-08-25 MED ORDER — LORAZEPAM 1 MG PO TABS
2.0000 mg | ORAL_TABLET | ORAL | Status: DC | PRN
  Administered 2019-08-26 (×2): 2 mg via ORAL
  Filled 2019-08-25 (×3): qty 2

## 2019-08-25 MED ORDER — LORAZEPAM 2 MG/ML IJ SOLN: 2.0000 mg | INTRAMUSCULAR | Status: DC | PRN

## 2019-08-25 MED ORDER — MAGNESIUM HYDROXIDE 400 MG/5ML PO SUSP: 30.0000 mL | Freq: Every day | ORAL | Status: DC | PRN

## 2019-08-25 MED ORDER — TEMAZEPAM 30 MG PO CAPS
30.0000 mg | ORAL_CAPSULE | Freq: Every day | ORAL | Status: DC
  Administered 2019-08-25: 22:00:00 30 mg via ORAL
  Filled 2019-08-25: qty 1

## 2019-08-25 MED ORDER — ACETAMINOPHEN 325 MG PO TABS
650.0000 mg | ORAL_TABLET | Freq: Four times a day (QID) | ORAL | Status: DC | PRN
  Administered 2019-08-27: 06:00:00 650 mg via ORAL
  Filled 2019-08-25: qty 2

## 2019-08-25 MED ORDER — ZIPRASIDONE MESYLATE 20 MG IM SOLR: 20.0000 mg | Freq: Two times a day (BID) | INTRAMUSCULAR | Status: DC | PRN

## 2019-08-25 NOTE — BHH Counselor (Signed)
Adult Comprehensive Assessment  Tracy Poole ID: Tracy Poole, female   DOB: [birth year], 23 y.o.   MRN: [id number]  Information Source: Information source: Tracy Poole  Current Stressors:  Tracy Poole states their primary concerns and needs for treatment are:: "Mon and I got into an argument" Tracy Poole states their goals for this hospitilization and ongoing recovery are:: "To get out of here" Educational / Learning stressors: Tracy Poole denies stressors Employment / Job issues: Tracy Poole states that she did have a job but she quit. Tracy Poole is currently unemployed. Family Relationships: Tracy Poole reports that she often has conflicts with her mother. Financial / Lack of resources (include bankruptcy): Tracy Poole denies stressors. Housing / Lack of housing: Tracy Poole denies stressors Physical health (include injuries & life threatening diseases): Tracy Poole denies stressors Social relationships: Tracy Poole denies stressors Substance abuse: Tracy Poole denies substance use. Bereavement / Loss: Tracy Poole denies strressors.  Living/Environment/Situation:  Living Arrangements: Parent, Children Living conditions (as described by Tracy Poole or guardian): "We do not get along sometiems" Who else lives in the home?: Tracy Poole's mother and daughter (1) How long has Tracy Poole lived in current situation?: 2 years What is atmosphere in current home: Comfortable, Chaotic, Supportive  Family History:  Marital status: Single Are you sexually active?: Yes What is your sexual orientation?: Heterosexual Has your sexual activity been affected by drugs, alcohol, medication, or emotional stress?: No Does Tracy Poole have children?: Yes How many children?: 1 How is Tracy Poole's relationship with their children?: Tracy Poole reports that her daughter is 41 years old and states, "I have a good relationship with her. I love her. She is my baby and nobody else baby".  Childhood History:  By whom was/is the Tracy Poole raised?: Mother, Grandparents Additional childhood history information: N/A Description of  Tracy Poole's relationship with caregiver when they were a child: Tracy Poole states that she had a good relationship. Tracy Poole's description of current relationship with people who raised him/her: Tracy Poole states that she gets along with her mother and grandparents. How were you disciplined when you got in trouble as a child/adolescent?: N/A Does Tracy Poole have siblings?: Yes Number of Siblings: 2 Description of Tracy Poole's current relationship with siblings: Tracy Poole states that she has siblings on her father side. Did Tracy Poole suffer any verbal/emotional/physical/sexual abuse as a child?: No Did Tracy Poole suffer from severe childhood neglect?: No Has Tracy Poole ever been sexually abused/assaulted/raped as an adolescent or adult?: No Was the Tracy Poole ever a victim of a crime or a disaster?: No Witnessed domestic violence?: No Has Tracy Poole been effected by domestic violence as an adult?: No  Education:  Highest grade of school Tracy Poole has completed: 11th grade Currently a student?: No Learning disability?: No  Employment/Work Situation:   Employment situation: Unemployed Tracy Poole's job has been impacted by current illness: No What is the longest time Tracy Poole has a held a job?: 6 months Where was the Tracy Poole employed at that time?: [employer] Did You Receive Any Psychiatric Treatment/Services While in the [employer]?: No Are There Guns or Other Weapons in Your Home?: No  Financial Resources:   Surveyor, quantity resources: Writer, Medicaid Does Tracy Poole have a Lawyer or guardian?: No  Alcohol/Substance Abuse:   What has been your use of drugs/alcohol within the last 12 months?: Tracy Poole denies substance abuse. If attempted suicide, did drugs/alcohol play a role in this?: No Alcohol/Substance Abuse Treatment Hx: Denies past history Has alcohol/substance abuse ever caused legal problems?: No  Social Support System:   Tracy Poole's Community Support System: Fair Describe Community Support System: Tracy Poole's  mother Type of faith/religion: Tracy Poole denies having a  faith/religion. How does Tracy Poole's faith help to cope with current illness?: N/A  Leisure/Recreation:   Leisure and Hobbies: Dance, sing, music, and video games.  Strengths/Needs:   What is the Tracy Poole's perception of their strengths?: Cooking, doing hair, and communications/ Tracy Poole states they can use these personal strengths during their treatment to contribute to their recovery: Tracy Poole did not comment. Tracy Poole states these barriers may affect/interfere with their treatment: N/A Tracy Poole states these barriers may affect their return to the community: N/A Other important information Tracy Poole would like considered in planning for their treatment: N/A  Discharge Plan:   Currently receiving community mental health services: No Tracy Poole states concerns and preferences for aftercare planning are: [facility] Tracy Poole states they will know when they are safe and ready for discharge when: "Today" Does Tracy Poole have access to transportation?: Yes Does Tracy Poole have financial barriers related to discharge medications?: No Will Tracy Poole be returning to same living situation after discharge?: Yes(Tracy Poole's mother and daughter.)  Summary/Recommendations:   Summary and Recommendations (to be completed by the evaluator): Tracy Poole is a 23 year old female who stopped taking her medications. She believes the neighbor place a spell on her and they are now controlling her body. Tracy Poole's diagnosis is: Schizophrenia. Recommendations for Tracy Poole include: crisis stabilization, therapeutic milieu, medication management, attend and participate in group therapy, and development of a comprehensive mental wellness plan.  Tracy Poole. 08/25/2019

## 2019-08-25 NOTE — ED Notes (Signed)
Pt transferred into ED BHU room 5    Patient assigned to appropriate care area  She is IVC  Pending inpatient hospitalization . Patient oriented to unit/care area: Informed that, for her safety, care areas are designed for safety and monitored by security cameras at all times; Visiting hours and phone times explained to patient. Patient verbalizes understanding, and verbal contract for safety obtained.    Assessment completed   She denies pain

## 2019-08-25 NOTE — ED Notes (Signed)
ED  Is the patient under IVC or is there intent for IVC: Yes.   Is the patient medically cleared: Yes.   Is there vacancy in the ED BHU: Yes.   Is the population mix appropriate for patient: Yes.   Is the patient awaiting placement in inpatient or outpatient setting: Yes.   Has the patient had a psychiatric consult: Yes.   Survey of unit performed for contraband, proper placement and condition of furniture, tampering with fixtures in bathroom, shower, and each patient room: Yes.  ; Findings:  APPEARANCE/BEHAVIOR Calm and cooperative NEURO ASSESSMENT Orientation: oriented x3  Denies pain Hallucinations: No.None noted (Hallucinations) Speech: Normal Gait: normal RESPIRATORY ASSESSMENT Even  Unlabored respirations  CARDIOVASCULAR ASSESSMENT Pulses equal   regular rate  Skin warm and dry   GASTROINTESTINAL ASSESSMENT no GI complaint EXTREMITIES Full ROM  PLAN OF CARE Provide calm/safe environment. Vital signs assessed twice daily. ED BHU Assessment once each 12-hour shift. Collaborate with intake RN daily or as condition indicates. Assure the ED provider has rounded once each shift. Provide and encourage hygiene. Provide redirection as needed. Assess for escalating behavior; address immediately and inform ED provider.  Assess family dynamic and appropriateness for visitation as needed: Yes.  ; If necessary, describe findings:  Educate the patient/family about BHU procedures/visitation: Yes.  ; If necessary, describe findings:

## 2019-08-25 NOTE — ED Notes (Addendum)
Pt continually trying to stand up and looking at exits. Multiple security officers at bedside. Multiple attempts to verbally explain to pt that she needs to sit down in bed and may not leave at this time. EDP aware. Pt verbalizes understanding but continues with same behavior.

## 2019-08-25 NOTE — ED Notes (Signed)
Hourly rounding reveals patient in room. No complaints, stable, in no acute distress. Q15 minute rounds and monitoring via Security Cameras to continue. 

## 2019-08-25 NOTE — ED Provider Notes (Signed)
-----------------------------------------   9:50 AM on 08/25/2019 -----------------------------------------  Patient has been seen and evaluated by psychiatry.  Patient has been accepted to [facility]. We will attempt to arrange transport as soon as possible for the patient.  No concerning findings on medical work-up.   Harvest Dark, MD 08/25/19 [phone]

## 2019-08-25 NOTE — ED Notes (Signed)
Gave breakfast tray with ginger ale. 

## 2019-08-25 NOTE — ED Notes (Signed)
Hourly rounding reveals patient sleeping in room. No complaints, stable, in no acute distress. Q15 minute rounds and monitoring via Rover and Officer to continue.  

## 2019-08-25 NOTE — BH Assessment (Signed)
Writer called and left a HIPPA Compliant message with mother (Vicky [phone]), requesting a return phone call.

## 2019-08-25 NOTE — H&P (Signed)
Psychiatric Admission Assessment Adult  Patient Identification: Tracy Poole MRN:  [id number] Date of Evaluation:  08/25/2019 Chief Complaint:  Tracy Poole Principal Diagnosis: Exacerbation of schizophrenic condition Diagnosis:  Active Problems:   Schizophrenia (HCC)  History of Present Illness:   This is the latest of multiple admissions and healthcare encounters for Tracy Poole in our system she is 23 years of age she suffers from a schizophrenic condition.  She has been noncompliant with her medication.  In the past she has been on long-acting injectable paliperidone but her chief complaint is "I do not think I need medicines I do not think anything is wrong with me"  She then states that she "has powers and people do not accept that she has powers" these powers involved "seeing people who are dead, seeing witches warlocks" and elaborates "I know it sounds crazy"-she also states she hears people's thoughts and she can "read minds" she states she is only here because she fought with her mother.  Further history reveals she showed up at the emergency department on 9/23 requesting psychotropic med changes further on 9/27.  By 9/29 it was clear she needed admission  Further history reveals that she has been noncompliant with medications for several weeks and been suffering from hallucinations and believes that the neighbors are controlling her body.  When questioned specifically about this she states she has a neighbor named Tracy Poole who "put stuff on me" implying voodoo curses and so forth and "that is why I am going through this" As recently as 1 PM she displayed thought blocking and difficulty answering questions on my evaluation she is tearful and simply wants to go home  She is alert oriented to person place time and situation knows the day so forth she is tearful stating she just wants to go home reports the auditory and visual hallucinations denies thoughts of harming self or others denies  thoughts of harming her child. She also expresses a delusion that her she was given to her mother by another couple because her mother could not have children-I assume this is delusional -  Drug screen shows cannabis she states she "took a hit" but does not smoke regularly  Associated Signs/Symptoms: Depression Symptoms:  insomnia, (Hypo) Manic Symptoms:  Delusions, Distractibility, Anxiety Symptoms:  Excessive Worry, Psychotic Symptoms:  Delusions, Hallucinations: Auditory Tracy Poole PTSD Symptoms: NA Total Time spent with patient: 45 minutes  Past Psychiatric History: esxtensive  Is the patient at risk to self? Yes.    Has the patient been a risk to self in the past 6 months? No.  Has the patient been a risk to self within the distant past? No.  Is the patient a risk to others? No.  Has the patient been a risk to others in the past 6 months? No.  Has the patient been a risk to others within the distant past? No.   Prior Inpatient Therapy:   Prior Outpatient Therapy:    Alcohol Screening: 1. How often do you have a drink containing alcohol?: Monthly or less 2. How many drinks containing alcohol do you have on a typical day when you are drinking?: 3 or 4 3. How often do you have six or more drinks on one occasion?: Never AUDIT-C Score: 2 4. How often during the last year have you found that you were not able to stop drinking once you had started?: Never 5. How often during the last year have you failed to do what was normally expected from you becasue  of drinking?: Never 6. How often during the last year have you needed a first drink in the morning to get yourself going after a heavy drinking session?: Never 7. How often during the last year have you had a feeling of guilt of remorse after drinking?: Never 8. How often during the last year have you been unable to remember what happened the night before because you had been drinking?: Never 9. Have you or someone else been injured as  a result of your drinking?: No 10. Has a relative or friend or a doctor or another health worker been concerned about your drinking or suggested you cut down?: No Alcohol Use Disorder Identification Test Final Score (AUDIT): 2 Substance Abuse History in the last 12 months:  Yes.   Consequences of Substance Abuse: NA Previous Psychotropic Medications: Yes  Psychological Evaluations: No  Past Medical History:  Past Medical History:  Diagnosis Date  . Anxiety   . Asthma   . Depression     Past Surgical History:  Procedure Laterality Date  . CESAREAN SECTION    . EUSTACHIAN TUBE DILATION    . HERNIA REPAIR     Family History:  Family History  Problem Relation Age of Onset  . Bladder Cancer Neg Hx   . Kidney cancer Neg Hx    Family Psychiatric  History: ukn/denies  Tobacco Screening:   Social History:  Social History   Substance and Sexual Activity  Alcohol Use Yes   Comment: occasionallly     Social History   Substance and Sexual Activity  Drug Use No    Additional Social History: Marital status: Single Are you sexually active?: Yes What is your sexual orientation?: Heterosexual Has your sexual activity been affected by drugs, alcohol, medication, or emotional stress?: No Does patient have children?: Yes How many children?: 1 How is patient's relationship with their children?: Pt reports that her daughter is 23 years old and states, "I have a good relationship with her. I love her. She is my baby and nobody else baby".                         Allergies:  No Known Allergies Lab Results:  Results for orders placed or performed during the hospital encounter of 08/25/19 (from the past 48 hour(s))  Comprehensive metabolic panel     Status: Abnormal   Collection Time: 08/25/19 12:19 AM  Result Value Ref Range   Sodium 137 135 - 145 mmol/L   Potassium 3.3 (L) 3.5 - 5.1 mmol/L   Chloride 104 98 - 111 mmol/L   CO2 24 22 - 32 mmol/L   Glucose, Bld 100 (H) 70 -  99 mg/dL   BUN 5 (L) 6 - 20 mg/dL   Creatinine, Ser 8.540.80 0.44 - 1.00 mg/dL   Calcium 9.0 8.9 - 62.710.3 mg/dL   Total Protein 7.4 6.5 - 8.1 g/dL   Albumin 4.0 3.5 - 5.0 g/dL   AST 32 15 - 41 U/L   ALT 52 (H) 0 - 44 U/L   Alkaline Phosphatase 93 38 - 126 U/L   Total Bilirubin 1.1 0.3 - 1.2 mg/dL   GFR calc non Af Amer >60 >60 mL/min   GFR calc Af Amer >60 >60 mL/min   Anion gap 9 5 - 15    Comment: Performed at [facility], [address] [place], [place] [address]  Ethanol     Status: None   Collection Time: 08/25/19  12:19 AM  Result Value Ref Range   Alcohol, Ethyl (B) <10 <10 mg/dL    Comment: (NOTE) Lowest detectable limit for serum alcohol is 10 mg/dL. For medical purposes only. Performed at [facility], [address] [place], [place] [address]   Salicylate level     Status: None   Collection Time: 08/25/19 12:19 AM  Result Value Ref Range   Salicylate Lvl <7.0 2.8 - 30.0 mg/dL    Comment: Performed at [facility], [address] [place], [place] [address]  Acetaminophen level     Status: Abnormal   Collection Time: 08/25/19 12:19 AM  Result Value Ref Range   Acetaminophen (Tylenol), Serum <10 (L) 10 - 30 ug/mL    Comment: (NOTE) Therapeutic concentrations vary significantly. A range of 10-30 ug/mL  may be an effective concentration for many patients. However, some  are best treated at concentrations outside of this range. Acetaminophen concentrations >150 ug/mL at 4 hours after ingestion  and >50 ug/mL at 12 hours after ingestion are often associated with  toxic reactions. Performed at [facility], [address] [place], [place] [address]   cbc     Status: Abnormal   Collection Time: 08/25/19 12:19 AM  Result Value Ref Range   WBC 12.3 (H) 4.0 - 10.5 K/uL   RBC 4.65 3.87 - 5.11 MIL/uL   Hemoglobin 13.4 12.0 - 15.0 g/dL   HCT 67.6 19.5 - 09.3 %   MCV 86.0 80.0 - 100.0 fL   MCH 28.8 26.0 - 34.0 pg   MCHC 33.5 30.0 -  36.0 g/dL   RDW 26.7 12.4 - 58.0 %   Platelets 203 150 - 400 K/uL   nRBC 0.0 0.0 - 0.2 %    Comment: Performed at [facility], [address] [place], [place] [address]  Urine Drug Screen, Qualitative     Status: Abnormal   Collection Time: 08/25/19 12:19 AM  Result Value Ref Range   Tricyclic, Ur Screen NONE DETECTED NONE DETECTED   Amphetamines, Ur Screen NONE DETECTED NONE DETECTED   MDMA (Ecstasy)Ur Screen NONE DETECTED NONE DETECTED   Cocaine Metabolite,Ur [place] NONE DETECTED NONE DETECTED   Opiate, Ur Screen NONE DETECTED NONE DETECTED   Phencyclidine (PCP) Ur S NONE DETECTED NONE DETECTED   Cannabinoid 50 Ng, Ur [place] POSITIVE (A) NONE DETECTED   Barbiturates, Ur Screen NONE DETECTED NONE DETECTED   Benzodiazepine, Ur Scrn NONE DETECTED NONE DETECTED   Methadone Scn, Ur NONE DETECTED NONE DETECTED    Comment: (NOTE) Tricyclics + metabolites, urine    Cutoff 1000 ng/mL Amphetamines + metabolites, urine  Cutoff 1000 ng/mL MDMA (Ecstasy), urine              Cutoff 500 ng/mL Cocaine Metabolite, urine          Cutoff 300 ng/mL Opiate + metabolites, urine        Cutoff 300 ng/mL Phencyclidine (PCP), urine         Cutoff 25 ng/mL Cannabinoid, urine                 Cutoff 50 ng/mL Barbiturates + metabolites, urine  Cutoff 200 ng/mL Benzodiazepine, urine              Cutoff 200 ng/mL Methadone, urine                   Cutoff 300 ng/mL The urine drug screen provides only a preliminary, unconfirmed analytical test result and should  not be used for non-medical purposes. Clinical consideration and professional judgment should be applied to any positive drug screen result due to possible interfering substances. A more specific alternate chemical method must be used in order to obtain a confirmed analytical result. Gas chromatography / mass spectrometry (GC/MS) is the preferred confirmat ory method. Performed at [facility], [address] [place], [place] [address]    Pregnancy, urine POC     Status: None   Collection Time: 08/25/19 12:25 AM  Result Value Ref Range   Preg Test, Ur NEGATIVE NEGATIVE    Comment:        THE SENSITIVITY OF THIS METHODOLOGY IS >24 mIU/mL   SARS Coronavirus 2 [facility] order, Performed in [facility] hospital lab) Nasopharyngeal Nasopharyngeal Swab     Status: None   Collection Time: 08/25/19  7:11 AM   Specimen: Nasopharyngeal Swab  Result Value Ref Range   SARS Coronavirus 2 NEGATIVE NEGATIVE    Comment: (NOTE) If result is NEGATIVE SARS-CoV-2 target nucleic acids are NOT DETECTED. The SARS-CoV-2 RNA is generally detectable in upper and lower  respiratory specimens during the acute phase of infection. The lowest  concentration of SARS-CoV-2 viral copies this assay can detect is 250  copies / mL. A negative result does not preclude SARS-CoV-2 infection  and should not be used as the sole basis for treatment or other  patient management decisions.  A negative result may occur with  improper specimen collection / handling, submission of specimen other  than nasopharyngeal swab, presence of viral mutation(s) within the  areas targeted by this assay, and inadequate number of viral copies  (<250 copies / mL). A negative result must be combined with clinical  observations, patient history, and epidemiological information. If result is POSITIVE SARS-CoV-2 target nucleic acids are DETECTED. The SARS-CoV-2 RNA is generally detectable in upper and lower  respiratory specimens dur ing the acute phase of infection.  Positive  results are indicative of active infection with SARS-CoV-2.  Clinical  correlation with patient history and other diagnostic information is  necessary to determine patient infection status.  Positive results do  not rule out bacterial infection or co-infection with other viruses. If result is PRESUMPTIVE POSTIVE SARS-CoV-2 nucleic acids MAY BE PRESENT.   A presumptive positive result was obtained on  the submitted specimen  and confirmed on repeat testing.  While 2019 novel coronavirus  (SARS-CoV-2) nucleic acids may be present in the submitted sample  additional confirmatory testing may be necessary for epidemiological  and / or clinical management purposes  to differentiate between  SARS-CoV-2 and other Sarbecovirus currently known to infect humans.  If clinically indicated additional testing with an alternate test  methodology [phone]) is advised. The SARS-CoV-2 RNA is generally  detectable in upper and lower respiratory sp ecimens during the acute  phase of infection. The expected result is Negative. Fact Sheet for Patients:  BoilerBrush.com.cy Fact Sheet for Healthcare Providers: https://pope.com/ This test is not yet approved or cleared by the [place] FDA and has been authorized for detection and/or diagnosis of SARS-CoV-2 by FDA under an Emergency Use Authorization (EUA).  This EUA will remain in effect (meaning this test can be used) for the duration of the COVID-19 declaration under Section 564(b)(1) of the Act, 21 U.S.C. section 360bbb-3(b)(1), unless the authorization is terminated or revoked sooner. Performed at [facility], [address] [place], [place] [address]     Blood Alcohol level:  Lab Results  Component Value Date  ETH <10 08/25/2019   ETH <10 32/44/0102    Metabolic Disorder Labs:  Lab Results  Component Value Date   HGBA1C 4.9 03/27/2018   MPG 93.93 03/27/2018   MPG 100 03/28/2017   Lab Results  Component Value Date   PROLACTIN 53.4 (H) 03/28/2017   Lab Results  Component Value Date   CHOL 132 03/27/2018   TRIG 58 03/27/2018   HDL 29 (L) 03/27/2018   CHOLHDL 4.6 03/27/2018   VLDL 12 03/27/2018   LDLCALC 91 03/27/2018   LDLCALC 77 03/28/2017    Current Medications: Current Facility-Administered Medications  Medication Dose Route Frequency Provider Last Rate Last  Dose  . acetaminophen (TYLENOL) tablet 650 mg  650 mg Oral Q6H PRN Emmaline Kluver, FNP      . alum & mag hydroxide-simeth (MAALOX/MYLANTA) 200-200-20 MG/5ML suspension 30 mL  30 mL Oral Q4H PRN Emmaline Kluver, FNP      . benztropine (COGENTIN) tablet 0.5 mg  0.5 mg Oral BID Johnn Hai, MD      . gabapentin (NEURONTIN) capsule 300 mg  300 mg Oral TID Johnn Hai, MD      . LORazepam (ATIVAN) tablet 2 mg  2 mg Oral Q4H PRN Johnn Hai, MD       Or  . LORazepam (ATIVAN) injection 2 mg  2 mg Intramuscular Q4H PRN Johnn Hai, MD      . ziprasidone (GEODON) injection 20 mg  20 mg Intramuscular Q12H PRN Emmaline Kluver, FNP       And  . LORazepam (ATIVAN) tablet 1 mg  1 mg Oral PRN Emmaline Kluver, FNP      . magnesium hydroxide (MILK OF MAGNESIA) suspension 30 mL  30 mL Oral Daily PRN Emmaline Kluver, FNP      . omega-3 acid ethyl esters (LOVAZA) capsule 1 g  1 g Oral BID Johnn Hai, MD      . risperiDONE (RISPERDAL) tablet 3 mg  3 mg Oral BID Johnn Hai, MD      . temazepam (RESTORIL) capsule 30 mg  30 mg Oral QHS Johnn Hai, MD       PTA Medications: Medications Prior to Admission  Medication Sig Dispense Refill Last Dose  . ABILIFY 5 MG tablet Take 1 tablet (5 mg total) by mouth daily for 14 days. 14 tablet 0   . meloxicam (MOBIC) 15 MG tablet Take 1 tablet (15 mg total) by mouth daily. 30 tablet 0   . paliperidone (INVEGA SUSTENNA) 156 MG/ML SUSY injection Inject 1 mL (156 mg total) into the muscle every 28 (twenty-eight) days. 1 mL 2     Musculoskeletal: Strength & Muscle Tone: within normal limits Gait & Station: normal Patient leans: N/A  Psychiatric Specialty Exam: Physical Exam  Nursing note and vitals reviewed. Constitutional: She appears well-developed and well-nourished.  Cardiovascular: Normal rate and regular rhythm.    Review of Systems  Constitutional: Negative.   Eyes: Negative.   Cardiovascular: Negative.   Gastrointestinal: Negative.   Genitourinary: Negative.    Skin: Negative.   Neurological: Negative.   Endo/Heme/Allergies: Negative.   Obesity  Blood pressure 118/74, pulse 98, temperature 98.6 F (37 C), temperature source Oral, resp. rate 16, height 5\' 4"  (1.626 m), weight 73.5 kg, SpO2 100 %.Body mass index is 27.81 kg/m.  General Appearance: Casual  Eye Contact:  Fair  Speech:  Slow  Volume:  Decreased  Mood:  Depressed  Affect:  Tearful  Thought Process:  Goal Directed  and Descriptions of Associations: Circumstantial  Orientation:  Full (Time, Place, and Person)  Thought Content:  Illogical, Delusions and Hallucinations: Auditory Visual  Suicidal Thoughts:  No  Homicidal Thoughts:  No  Memory:  Immediate;   Fair Recent;   Fair Remote;   Good  Judgement:  Fair  Insight:  Shallow  Psychomotor Activity:  Decreased  Concentration:  Concentration: Fair  Recall:  Poor  Fund of Knowledge:  Poor  Language:  Good  Akathisia:  Negative  Handed:  Right  AIMS (if indicated):     Assets:  Communication Skills Desire for Improvement Housing Leisure Time Physical Health  ADL's:  Intact  Cognition:  WNL  Sleep:       Treatment Plan Summary: Daily contact with patient to assess and evaluate symptoms and progress in treatment and Medication management  Observation Level/Precautions:  15 minute checks  Laboratory:  UDS  Psychotherapy: Reality based med and illness education  Medications: Add oral antipsychotics  Consultations: None necessary  Discharge Concerns: Longer term stability and compliance  Estimated LOS: 5-7  Other: Axis I chronic paranoid schizophrenia/drug screen positive for cannabis   Physician Treatment Plan for Primary Diagnosis:  Axis I schizophrenia paranoid type/cannabis abuse probable/plans to stabilize with oral antipsychotics basic risk benefits side effects discussed/then transition to long-acting injectable again  Long Term Goal(s): Improvement in symptoms so as ready for discharge  Short Term Goals:  Ability to identify changes in lifestyle to reduce recurrence of condition will improve, Ability to verbalize feelings will improve, Ability to disclose and discuss suicidal ideas, Ability to demonstrate self-control will improve, Ability to identify and develop effective coping behaviors will improve, Ability to maintain clinical measurements within normal limits will improve and Compliance with prescribed medications will improve  Physician Treatment Plan for Secondary Diagnosis: Active Problems:   Schizophrenia (HCC)  Long Term Goal(s): Improvement in symptoms so as ready for discharge  Short Term Goals: Ability to identify changes in lifestyle to reduce recurrence of condition will improve, Ability to verbalize feelings will improve, Ability to identify and develop effective coping behaviors will improve and Ability to maintain clinical measurements within normal limits will improve  I certify that inpatient services furnished can reasonably be expected to improve the patient's condition.    Unable to reach mother at this point for collateral history however her collateral history is documented on the note of 9/29 by Ms. Eliezer Champagne, MD 9/29/20202:58 PM

## 2019-08-25 NOTE — [facility] Suicide Risk Assessment (Signed)
[facility] Admission Suicide Risk Assessment   Nursing information obtained from:  Patient Demographic factors:  Unemployed, Adolescent or young adult, Low socioeconomic status Current Mental Status:  NA Loss Factors:  NA Historical Factors:  NA Risk Reduction Factors:  Positive social support, Positive coping skills or problem solving skills, Responsible for children under 23 years of age, Living with another person, especially a relative, Positive therapeutic relationship  Total Time spent with patient: 45 minutes Principal Problem: Paranoid schizophrenia Diagnosis:  Active Problems:   Schizophrenia ([place])  Subjective Data: admitted for stabalization  Continued Clinical Symptoms:  Alcohol Use Disorder Identification Test Final Score (AUDIT): 2 The "Alcohol Use Disorders Identification Test", Guidelines for Use in Primary Care, Second Edition.  World Pharmacologist [facility]). Score between 0-7:  no or low risk or alcohol related problems. Score between 8-15:  moderate risk of alcohol related problems. Score between 16-19:  high risk of alcohol related problems. Score 20 or above:  warrants further diagnostic evaluation for alcohol dependence and treatment.   CLINICAL FACTORS:   Previous Psychiatric Diagnoses and Treatments   Musculoskeletal: Strength & Muscle Tone: within normal limits Gait & Station: normal Patient leans: N/A  Psychiatric Specialty Exam: Physical Exam  Nursing note and vitals reviewed. Constitutional: She appears well-developed and well-nourished.  Cardiovascular: Normal rate and regular rhythm.    Review of Systems  Constitutional: Negative.   Eyes: Negative.   Cardiovascular: Negative.   Gastrointestinal: Negative.   Genitourinary: Negative.   Skin: Negative.   Neurological: Negative.   Endo/Heme/Allergies: Negative.   Obesity  Blood pressure 118/74, pulse 98, temperature 98.6 F (37 C), temperature source Oral, resp. rate 16, height 5\' 4"  (1.626 m),  weight 73.5 kg, SpO2 100 %.Body mass index is 27.81 kg/m.  General Appearance: Casual  Eye Contact:  Fair  Speech:  Slow  Volume:  Decreased  Mood:  Depressed  Affect:  Tearful  Thought Process:  Goal Directed and Descriptions of Associations: Circumstantial  Orientation:  Full (Time, Place, and Person)  Thought Content:  Illogical, Delusions and Hallucinations: Auditory Visual  Suicidal Thoughts:  No  Homicidal Thoughts:  No  Memory:  Immediate;   Fair Recent;   Fair Remote;   Good  Judgement:  Fair  Insight:  Shallow  Psychomotor Activity:  Decreased  Concentration:  Concentration: Fair  Recall:  Poor  Fund of Knowledge:  Poor  Language:  Good  Akathisia:  Negative  Handed:  Right  AIMS (if indicated):     Assets:  Communication Skills Desire for Improvement Housing Leisure Time Physical Health  ADL's:  Intact  Cognition:  WNL  Sleep:        COGNITIVE FEATURES THAT CONTRIBUTE TO RISK:  Loss of executive function    SUICIDE RISK:   Minimal: No identifiable suicidal ideation.  Patients presenting with no risk factors but with morbid ruminations; may be classified as minimal risk based on the severity of the depressive symptoms  PLAN OF CARE: see eval  I certify that inpatient services furnished can reasonably be expected to improve the patient's condition.   Johnn Hai, MD 08/25/2019, 3:08 PM

## 2019-08-25 NOTE — ED Notes (Signed)
Pt. Transferred from Triage to room 20 after dressing out and screening for contraband. Pt. Oriented to Quad including Q15 minute rounds as well as Rover and Officer for their protection. Patient is alert and oriented, warm and dry in no acute distress. Patient denies SI, HI, and AVH. Pt. Encouraged to let me know if needs arise.     

## 2019-08-25 NOTE — ED Notes (Signed)
BEHAVIORAL HEALTH ROUNDING Patient sleeping: No. Patient alert and oriented: yes Behavior appropriate: Yes.  ; If no, describe:  Nutrition and fluids offered: yes Toileting and hygiene offered: Yes  Sitter present: q15 minute observations  ENVIRONMENTAL ASSESSMENT Potentially harmful objects out of patient reach: Yes.   Personal belongings secured: Yes.   Patient dressed in hospital provided attire only: Yes.   Plastic bags out of patient reach: Yes.   Patient care equipment (cords, cables, call bells, lines, and drains) shortened, removed, or accounted for: Yes.   Equipment and supplies removed from bottom of stretcher: Yes.   Potentially toxic materials out of patient reach: Yes.   Sharps container removed or out of patient reach: Yes.   

## 2019-08-25 NOTE — BH Assessment (Addendum)
Patient has been accepted to [facility].  Patient assigned to room 500-2 Accepting physician is Dr. Jake Samples. Call report to [phone].  Representative was Judson Roch ([place]).   ER Staff is aware of it:  Anne Ng, ER Secretary  Dr. Kerman Passey, ER MD  Amy T., Patient's Nurse     Patient's Family/Support System [name]: [phone]) have been updated as well.

## 2019-08-25 NOTE — Progress Notes (Signed)
D: Pt denies SI/HI/AVH. Pt is pleasant and cooperative. Pt visible on the unit. Pt expressed her desire to go home. Writer expressed the best way to do that was to take her medications, go to group and participate in her discharge planning.  A: Pt was offered support and encouragement.  Pt was encourage to attend groups. Q 15 minute checks were done for safety.  R:Pt attends groups and interacts well with peers and staff. Pt is taking medication. Pt has no complaints.Pt receptive to treatment and safety maintained on unit.

## 2019-08-25 NOTE — ED Notes (Signed)
Hourly rounding reveals patient in room. No complaints, stable, in no acute distress. Q15 minute rounds and monitoring via Rover and Officer to continue.   

## 2019-08-25 NOTE — Progress Notes (Signed)
Patient ID: Tracy Poole, female   DOB: 24-Aug-1996, 23 y.o.   MRN: [id number] Admission Note  Pt is a 23 yo female that presents IVC'd on 08/25/2019 with reported increasing paranoia, AH/VH and not taking their medications. From report, pt is stated as feeling the neighbors are practicing voodoo and have somehow corrupted her mother. Upon arrival, pt states that she actually called the police because she was having an argument with her mother. Pt states she was brought to the hospital and just wants to go home now. Pt states that her life isn't stressful, but does have a 23 year old at home. Pt states that she isn't prescribed medications and doesn't have a pcp. Pt denies drug abuse. Pt drinks occ. Pt is a 1 ppd smoker. Pt denies a patch or gum. Pt denies a pneumonia vaccine. Pt denies being with the father of her child but states he is sometimes supportive. Pt denies past/present verbal/physical/sexual abuse. Pt denies having a job currently. Pt feels the mother is the only support system. Pt lives with the mother. Pt denies si/hi/ah/vh and verbally agrees to approach staff if these become apparent or before harming herself/others while at [place].   Per TTS:  Tracy Poole is an 23 y.o. female who presents to the ER under IVC, after her mother petitioned. Per ER notes, patient stop taking her medications. She believes the neighbor place a spell on her and they are now controlling her body.  During the interview, the patient was calm and attempted to participate  in the assessment. However, patient was having thought blocking and difficulty answering the questions. Patient indicated she knew the answers but was noticeable she was unable to think of the answers. However, patient shared she needed help, but when was didn't known what kind of help she needed. At one point she said her medications are working but latter she shared they wasn't. She also shared she was taking her medications as prescribed but  then she shared she wasn't taking them. "I'm confused. I don't know. I need help."

## 2019-08-25 NOTE — ED Provider Notes (Signed)
[facility] Emergency Department Provider Note   ____________________________________________   First MD Initiated Contact with Patient 08/25/19 [phone]     (approximate)  I have reviewed the triage vital signs and the nursing notes.   HISTORY  Chief Complaint Psychiatric Evaluation    HPI Tracy Poole is a 23 y.o. female brought to the ED under IVC for psychosis.  Patient has a history of schizophrenia who has recently been to the ED multiple times for adjustment of her Abilify.  Per records, she has missed several of her appointments are [facility].  Committed by her family who states she has been hallucinating and thinks that her neighbor is doing voodoo and has taken over her mother's body.  Patient denies active SI/HI.  Voices no medical complaints.       Past Medical History:  Diagnosis Date  . Anxiety   . Asthma   . Depression     Patient Active Problem List   Diagnosis Date Noted  . Severe recurrent major depression with psychotic features (HCC) 03/26/2018  . Noncompliance 03/25/2018  . Pregnancy 09/29/2017  . Pregnancy affected by fetal growth restriction 09/05/2017  . Abnormal findings on antenatal screening   . Major depressive disorder, single episode, severe with psychotic features (HCC) 03/28/2017  . First trimester pregnancy 03/27/2017  . Schizophrenia (HCC) 03/27/2017    Past Surgical History:  Procedure Laterality Date  . CESAREAN SECTION    . EUSTACHIAN TUBE DILATION    . HERNIA REPAIR      Prior to Admission medications   Medication Sig Start Date End Date Taking? Authorizing Provider  ABILIFY 5 MG tablet Take 1 tablet (5 mg total) by mouth daily for 14 days. 08/12/19 08/26/19 Yes Shaune PollackIsaacs, Cameron, MD  meloxicam (MOBIC) 15 MG tablet Take 1 tablet (15 mg total) by mouth daily. 07/15/18  Yes Cuthriell, Delorise RoyalsJonathan D, PA-C  paliperidone (INVEGA SUSTENNA) 156 MG/ML SUSY injection Inject 1 mL (156 mg total) into the muscle every 28  (twenty-eight) days. 04/04/18  Yes Clapacs, Jackquline DenmarkJohn T, MD    Allergies Patient has no known allergies.  Family History  Problem Relation Age of Onset  . Bladder Cancer Neg Hx   . Kidney cancer Neg Hx     Social History Social History   Tobacco Use  . Smoking status: Current Every Day Smoker    Packs/day: 1.00    Types: Cigarettes  . Smokeless tobacco: Never Used  Substance Use Topics  . Alcohol use: Yes    Comment: occasionallly  . Drug use: No    Review of Systems  Constitutional: No fever/chills Eyes: No visual changes. ENT: No sore throat. Cardiovascular: Denies chest pain. Respiratory: Denies shortness of breath. Gastrointestinal: No abdominal pain.  No nausea, no vomiting.  No diarrhea.  No constipation. Genitourinary: Negative for dysuria. Musculoskeletal: Negative for back pain. Skin: Negative for rash. Neurological: Negative for headaches, focal weakness or numbness. Psychiatric:  Positive for hallucinations.   ____________________________________________   PHYSICAL EXAM:  VITAL SIGNS: ED Triage Vitals  Enc Vitals Group     BP 08/25/19 0012 (!) 149/85     Pulse Rate 08/25/19 0012 95     Resp 08/25/19 0012 18     Temp 08/25/19 0012 98.7 F (37.1 C)     Temp Source 08/25/19 0012 Oral     SpO2 08/25/19 0012 97 %     Weight 08/25/19 0013 250 lb (113.4 kg)     Height 08/25/19 0013 5\' 4"  (1.626  m)     Head Circumference --      Peak Flow --      Pain Score 08/25/19 0012 0     Pain Loc --      Pain Edu? --      Excl. in GC? --     Constitutional: Alert and oriented. Well appearing and in no acute distress. Eyes: Conjunctivae are normal. PERRL. EOMI. Head: Atraumatic. Nose: No congestion/rhinnorhea. Mouth/Throat: Mucous membranes are moist.  Oropharynx non-erythematous. Neck: No stridor.   Cardiovascular: Normal rate, regular rhythm. Grossly normal heart sounds.  Good peripheral circulation. Respiratory: Normal respiratory effort.  No retractions.  Lungs CTAB. Gastrointestinal: Soft and nontender. No distention. No abdominal bruits. No CVA tenderness. Musculoskeletal: No lower extremity tenderness nor edema.  No joint effusions. Neurologic:  Normal speech and language. No gross focal neurologic deficits are appreciated. No gait instability. Skin:  Skin is warm, dry and intact. No rash noted. Psychiatric: Mood and affect are flat. Speech and behavior are normal.  ____________________________________________   LABS (all labs ordered are listed, but only abnormal results are displayed)  Labs Reviewed  COMPREHENSIVE METABOLIC PANEL - Abnormal; Notable for the following components:      Result Value   Potassium 3.3 (*)    Glucose, Bld 100 (*)    BUN 5 (*)    ALT 52 (*)    All other components within normal limits  ACETAMINOPHEN LEVEL - Abnormal; Notable for the following components:   Acetaminophen (Tylenol), Serum <10 (*)    All other components within normal limits  CBC - Abnormal; Notable for the following components:   WBC 12.3 (*)    All other components within normal limits  URINE DRUG SCREEN, QUALITATIVE ([facility] ONLY) - Abnormal; Notable for the following components:   Cannabinoid 50 Ng, Ur [place] POSITIVE (*)    All other components within normal limits  ETHANOL  SALICYLATE LEVEL  POC URINE PREG, ED  POCT PREGNANCY, URINE   ____________________________________________  EKG  None ____________________________________________  RADIOLOGY  ED MD interpretation: None  Official radiology report(s): No results found.  ____________________________________________   PROCEDURES  Procedure(s) performed (including Critical Care):  Procedures   ____________________________________________   INITIAL IMPRESSION / ASSESSMENT AND PLAN / ED COURSE  As part of my medical decision making, I reviewed the following data within the electronic MEDICAL RECORD NUMBER Nursing notes reviewed and incorporated, Labs reviewed, Old chart  reviewed, A consult was requested and obtained from this/these consultant(s) Psychiatry and Notes from prior ED visits     Delayna Jaylan Duggar was evaluated in Emergency Department on 08/25/2019 for the symptoms described in the history of present illness. She was evaluated in the context of the global COVID-19 pandemic, which necessitated consideration that the patient might be at risk for infection with the SARS-CoV-2 virus that causes COVID-19. Institutional protocols and algorithms that pertain to the evaluation of patients at risk for COVID-19 are in a state of rapid change based on information released by regulatory bodies including the CDC and federal and state organizations. These policies and algorithms were followed during the patient's care in the ED.    23 year old female with schizophrenia who is brought to the ED under IVC for hallucinations.  Will maintain IVC pending psychiatric evaluation and disposition.  Clinical Course as of Aug 24 617  Tue Aug 25, 2019  0617 No events overnight.  Patient remains in the ED under IVC.  Disposition per psychiatry.   [JS]    Clinical  Course User Index [JS] Paulette Blanch, MD     ____________________________________________   FINAL CLINICAL IMPRESSION(S) / ED DIAGNOSES  Final diagnoses:  Schizophrenia, unspecified type ([place])  Psychosis, unspecified psychosis type ([place])  Marijuana abuse     ED Discharge Orders    None       Note:  This document was prepared using Dragon voice recognition software and may include unintentional dictation errors.   Paulette Blanch, MD 08/25/19 [phone]

## 2019-08-25 NOTE — ED Notes (Signed)
BEHAVIORAL HEALTH ROUNDING Patient sleeping: No. Patient alert and oriented: yes Behavior appropriate: Yes.  ; If no, describe:  Nutrition and fluids offered: yes Toileting and hygiene offered: Yes  Sitter present: q15 minute observations and security camera monitoring   

## 2019-08-25 NOTE — Tx Team (Signed)
Initial Treatment Plan 08/25/2019 12:10 PM Tracy Poole MEB:[id number]    PATIENT STRESSORS: Marital or family conflict Medication change or noncompliance   PATIENT STRENGTHS: Communication skills Physical Health Supportive family/friends   PATIENT IDENTIFIED PROBLEMS:                      DISCHARGE CRITERIA:  Ability to meet basic life and health needs Improved stabilization in mood, thinking, and/or behavior Motivation to continue treatment in a less acute level of care Need for constant or close observation no longer present  PRELIMINARY DISCHARGE PLAN: Outpatient therapy Return to previous living arrangement  PATIENT/FAMILY INVOLVEMENT: This treatment plan has been presented to and reviewed with the patient, Tracy Poole.  The patient and family have been given the opportunity to ask questions and make suggestions.  Baron Sane, RN 08/25/2019, 12:10 PM

## 2019-08-25 NOTE — Progress Notes (Signed)
Adult Psychoeducational Group Note  Date:  08/25/2019 Time:  9:41 PM  Group Topic/Focus:  Wrap-Up Group:   The focus of this group is to help patients review their daily goal of treatment and discuss progress on daily workbooks.  Participation Level:  Active  Participation Quality:  Appropriate  Affect:  Appropriate  Cognitive:  Appropriate  Insight: Appropriate  Engagement in Group:  Developing/Improving  Modes of Intervention:  Discussion  Additional Comments: Pt stated her goal for today was to talk with her doctor about her discharge plan. Pt stated she did not accomplished her goal today.  Pt stated her relationship with her family needs improvement. Pt stated she felt better about herself today. Pt rated her overall day an 8 out of 10. Pt stated her appetite was pretty good today. Pt stated her goal for tonight was to get some rest. Pt did not complain of pain tonight. Pt stated she was not hearing or seeing anything that was not there. Pt stated she had no thoughts of harming herself or others. Pt stated she would alert staff if anything changes.   Candy Sledge 08/25/2019, 9:41 PM

## 2019-08-25 NOTE — Consult Note (Signed)
[facility]   Reason for Consult: Medication change Referring Physician: Dr. Dolores FrameSung Patient Identification: Tracy KickCinna Latoni Poole MRN:  [id number] Principal Diagnosis: <principal problem not specified> Diagnosis:  Active Problems:   * No active hospital problems. *   Total Time spent with patient: 45 minutes  Subjective: "I  need help.  I am not feeling good" Tracy Poole is a 23 y.o. female patient [facility] via law enforcement under involuntary commitment status (IVC).  The patient is unsure why she is here, but voice she needs help. Per ED triage nursing notes, the patient has been off her meds for several weeks and has been hallucinating.  Per the patient and mother, the patient thinks the neighbor is controlling her mom's body.   The patient states, "I am here to get help."  When asked what type of help she needs?  The patient is unable to elaborate on what she is requesting.  The patient is presenting with thought blocking, along with difficulties answering questions that are posed to her.  This provider spoke with the patient and mother, Ms.Vicky Laurence AlyCorbett, [phone], who voice concern for the patient's behaviors lately. She states, "my daughter is experiencing hallucinations but refused to admit to it."  She says, "she needs help.  She has a 47-year-old daughter and is not able to be the mom she needs to be to my granddaughter."  The patient was seen face-to-face by this provider; the chart reviewed and consulted with Dr. Dolores FrameSung on 08/25/2019 due to the patient's care. It was discussed with the EDP that the patient does meet the criteria to be admitted to the psychiatric inpatient unit.  The patient is alert and oriented x3, calm, cooperative, and mood-congruent with affect on evaluation. The patient does appear to be responding to internal stimuli but denies external stimuli. Neither is the patient presenting with any delusional thinking. The patient denies  both auditory and visual hallucinations but her mother believes she is experiencing some type hallucinations. The patient denies any suicidal, homicidal, or self-harm ideations. The patient is presenting with some psychotic and paranoid behaviors. During an encounter with the patient, she was unable to answer questions appropriately.  Collateral was obtained by the patient's mother, Ms.Vicky Laurence AlyCorbett, [phone], who expresses concerns for her daughter's hallucinations.  She states that her daughter believes that the neighbor has gotten into her body (mom's body) and is controlling her mother and her mother's boyfriend.  The patient mom voice that the patient believes someone has poisoned her, and she refuses to drink water, swallow her spit, and does not take a bath like she usually did.  Mom's voice the patient did excellent when she was on her injectable from [facility].  Mom states she does not know the name of the medication but is hoping that the staff can contact [facility] and get the name of the injection her daughter was taking.  She voiced that her daughter's behavior has changed drastically, and she is concerned because she has a 117-year-old baby and is unable to be the mom that she needs to be.   Plan: The patient is a safety risk to self and does require psychiatric inpatient admission for stabilization and treatment. It was discussed with Mr. Arsenio LoaderLeduc, RN, about passing on in report to the oncoming shift to relate to the pharmacy technician to contact [facility] and get the name of the patient monthly psychiatric injection that she was on previously.  HPI: Per Dr. Dolores FrameSung; Tracy Heftinna Latoni  Poole is a 23 y.o. female who returns to the ED from home wanting to have her Abilify dosage changed again.  Patient's was seen initially in the ED on 9/11 with a request to restart her Abilify.  This was restarted at 50 mg.  She came back on 9/16 stating 15 mg dose was too much and she felt like she was being oversedated.  In the interim,  the ED provider confirmed that patient has not kept her [facility] appointments for the past several months.  Tonight she feels like the 5 mg dose that was started on 9/16 is not enough.  Brings her toddler with her in the middle of the night.  Denies active SI/HI/AH/VH.  Voices no medical complaints.  Past Psychiatric History:  Anxiety Depression  Risk to Self: Suicidal Ideation: No Suicidal Intent: No Is patient at risk for suicide?: No Suicidal Plan?: No Access to Means: No What has been your use of drugs/alcohol within the last 12 months?: Reports of none How many times?: 0 Other Self Harm Risks: Reports of none Triggers for Past Attempts: None known Intentional Self Injurious Behavior: NoneNo Risk to Others: Homicidal Ideation: No Thoughts of Harm to Others: No Current Homicidal Intent: No Current Homicidal Plan: No Access to Homicidal Means: No Identified Victim: Reports of none History of harm to others?: No Assessment of Violence: None Noted Violent Behavior Description: Reports of none Does patient have access to weapons?: No Criminal Charges Pending?: No Does patient have a court date: NoNo Prior Inpatient Therapy: Prior Inpatient Therapy: Yes Prior Therapy Dates: 02/2018 & 03/2017 Prior Therapy Facilty/Provider(s): [facility] Reason for Treatment: SchizophreniaNo Prior Outpatient Therapy: Prior Outpatient Therapy: Yes Prior Therapy Dates: Current Prior Therapy Facilty/Provider(s): Unable to remember the name Reason for Treatment: Schizophrenia  Does patient have an ACCT team?: No Does patient have Intensive In-House Services?  : No Does patient have [facility] services? : No Does patient have P4CC services?: NoYes  Past Medical History:  Past Medical History:  Diagnosis Date  . Anxiety   . Asthma   . Depression     Past Surgical History:  Procedure Laterality Date  . CESAREAN SECTION    . EUSTACHIAN TUBE DILATION    . HERNIA REPAIR     Family History:  Family  History  Problem Relation Age of Onset  . Bladder Cancer Neg Hx   . Kidney cancer Neg Hx    Family Psychiatric  History:  Social History:  Social History   Substance and Sexual Activity  Alcohol Use Yes   Comment: occasionallly     Social History   Substance and Sexual Activity  Drug Use No    Social History   Socioeconomic History  . Marital status: Single    Spouse name: Not on file  . Number of children: Not on file  . Years of education: Not on file  . Highest education level: Not on file  Occupational History  . Not on file  Social Needs  . Financial resource strain: Not on file  . Food insecurity    Worry: Not on file    Inability: Not on file  . Transportation needs    Medical: Not on file    Non-medical: Not on file  Tobacco Use  . Smoking status: Current Every Day Smoker    Packs/day: 1.00    Types: Cigarettes  . Smokeless tobacco: Never Used  Substance and Sexual Activity  . Alcohol use: Yes    Comment: occasionallly  .  Drug use: No  . Sexual activity: Yes    Birth control/protection: None  Lifestyle  . Physical activity    Days per week: Not on file    Minutes per session: Not on file  . Stress: Not on file  Relationships  . Social Musician on phone: Not on file    Gets together: Not on file    Attends religious service: Not on file    Active member of club or organization: Not on file    Attends meetings of clubs or organizations: Not on file    Relationship status: Not on file  Other Topics Concern  . Not on file  Social History Narrative  . Not on file   Additional Social History:    Allergies:  No Known Allergies  Labs:  Results for orders placed or performed during the hospital encounter of 08/25/19 (from the past 48 hour(s))  Comprehensive metabolic panel     Status: Abnormal   Collection Time: 08/25/19 12:19 AM  Result Value Ref Range   Sodium 137 135 - 145 mmol/L   Potassium 3.3 (L) 3.5 - 5.1 mmol/L   Chloride  104 98 - 111 mmol/L   CO2 24 22 - 32 mmol/L   Glucose, Bld 100 (H) 70 - 99 mg/dL   BUN 5 (L) 6 - 20 mg/dL   Creatinine, Ser 1.61 0.44 - 1.00 mg/dL   Calcium 9.0 8.9 - 09.6 mg/dL   Total Protein 7.4 6.5 - 8.1 g/dL   Albumin 4.0 3.5 - 5.0 g/dL   AST 32 15 - 41 U/L   ALT 52 (H) 0 - 44 U/L   Alkaline Phosphatase 93 38 - 126 U/L   Total Bilirubin 1.1 0.3 - 1.2 mg/dL   GFR calc non Af Amer >60 >60 mL/min   GFR calc Af Amer >60 >60 mL/min   Anion gap 9 5 - 15    Comment: Performed at [facility], [address] [place], [place] [address]  Ethanol     Status: None   Collection Time: 08/25/19 12:19 AM  Result Value Ref Range   Alcohol, Ethyl (B) <10 <10 mg/dL    Comment: (NOTE) Lowest detectable limit for serum alcohol is 10 mg/dL. For medical purposes only. Performed at [facility], [address] [place], [place] [address]   Salicylate level     Status: None   Collection Time: 08/25/19 12:19 AM  Result Value Ref Range   Salicylate Lvl <7.0 2.8 - 30.0 mg/dL    Comment: Performed at [facility], [address] [place], [place] [address]  Acetaminophen level     Status: Abnormal   Collection Time: 08/25/19 12:19 AM  Result Value Ref Range   Acetaminophen (Tylenol), Serum <10 (L) 10 - 30 ug/mL    Comment: (NOTE) Therapeutic concentrations vary significantly. A range of 10-30 ug/mL  may be an effective concentration for many patients. However, some  are best treated at concentrations outside of this range. Acetaminophen concentrations >150 ug/mL at 4 hours after ingestion  and >50 ug/mL at 12 hours after ingestion are often associated with  toxic reactions. Performed at [facility], [address] [place], [place] [address]   cbc     Status: Abnormal   Collection Time: 08/25/19 12:19 AM  Result Value Ref Range   WBC 12.3 (H) 4.0 - 10.5 K/uL   RBC 4.65 3.87 - 5.11 MIL/uL   Hemoglobin 13.4 12.0 - 15.0  g/dL   HCT 40.0 36.0 - 46.0 %    MCV 86.0 80.0 - 100.0 fL   MCH 28.8 26.0 - 34.0 pg   MCHC 33.5 30.0 - 36.0 g/dL   RDW 13.5 11.5 - 15.5 %   Platelets 203 150 - 400 K/uL   nRBC 0.0 0.0 - 0.2 %    Comment: Performed at [facility], [address], [place], [place] [address]  Urine Drug Screen, Qualitative     Status: Abnormal   Collection Time: 08/25/19 12:19 AM  Result Value Ref Range   Tricyclic, Ur Screen NONE DETECTED NONE DETECTED   Amphetamines, Ur Screen NONE DETECTED NONE DETECTED   MDMA (Ecstasy)Ur Screen NONE DETECTED NONE DETECTED   Cocaine Metabolite,Ur [place] NONE DETECTED NONE DETECTED   Opiate, Ur Screen NONE DETECTED NONE DETECTED   Phencyclidine (PCP) Ur S NONE DETECTED NONE DETECTED   Cannabinoid 50 Ng, Ur [place] POSITIVE (A) NONE DETECTED   Barbiturates, Ur Screen NONE DETECTED NONE DETECTED   Benzodiazepine, Ur Scrn NONE DETECTED NONE DETECTED   Methadone Scn, Ur NONE DETECTED NONE DETECTED    Comment: (NOTE) Tricyclics + metabolites, urine    Cutoff 1000 ng/mL Amphetamines + metabolites, urine  Cutoff 1000 ng/mL MDMA (Ecstasy), urine              Cutoff 500 ng/mL Cocaine Metabolite, urine          Cutoff 300 ng/mL Opiate + metabolites, urine        Cutoff 300 ng/mL Phencyclidine (PCP), urine         Cutoff 25 ng/mL Cannabinoid, urine                 Cutoff 50 ng/mL Barbiturates + metabolites, urine  Cutoff 200 ng/mL Benzodiazepine, urine              Cutoff 200 ng/mL Methadone, urine                   Cutoff 300 ng/mL The urine drug screen provides only a preliminary, unconfirmed analytical test result and should not be used for non-medical purposes. Clinical consideration and professional judgment should be applied to any positive drug screen result due to possible interfering substances. A more specific alternate chemical method must be used in order to obtain a confirmed analytical result. Gas chromatography / mass spectrometry (GC/MS) is the preferred confirmat ory  method. Performed at [facility], [address] [place], [place] [address]   Pregnancy, urine POC     Status: None   Collection Time: 08/25/19 12:25 AM  Result Value Ref Range   Preg Test, Ur NEGATIVE NEGATIVE    Comment:        THE SENSITIVITY OF THIS METHODOLOGY IS >24 mIU/mL     No current facility-administered medications for this encounter.    Current Outpatient Medications  Medication Sig Dispense Refill  . ABILIFY 5 MG tablet Take 1 tablet (5 mg total) by mouth daily for 14 days. 14 tablet 0  . meloxicam (MOBIC) 15 MG tablet Take 1 tablet (15 mg total) by mouth daily. 30 tablet 0  . paliperidone (INVEGA SUSTENNA) 156 MG/ML SUSY injection Inject 1 mL (156 mg total) into the muscle every 28 (twenty-eight) days. 1 mL 2    Musculoskeletal: Strength & Muscle Tone: within normal limits Gait & Station: normal Patient leans: N/A  Psychiatric Specialty Exam: Physical Exam  Nursing note and vitals reviewed. Constitutional: She is oriented to person, place, and  time. She appears well-developed.  Neck: Normal range of motion. Neck supple.  Cardiovascular: Normal rate.  Respiratory: Effort normal.  Musculoskeletal: Normal range of motion.  Neurological: She is alert and oriented to person, place, and time.    Review of Systems  Psychiatric/Behavioral: Positive for depression and hallucinations. The patient is nervous/anxious and has insomnia.   All other systems reviewed and are negative.   Blood pressure (!) 149/85, pulse 95, temperature 98.7 F (37.1 C), temperature source Oral, resp. rate 18, height  (1.626 m), weight 113.4 kg, SpO2 97 %.Body mass index is 42.91 kg/m.  General Appearance: Casual  Eye Contact:  Fair  Speech:  Blocked and Normal Rate  Volume:  Normal  Mood:  Depressed  Affect:  Blunt, Congruent, Depressed, Flat and Restricted  Thought Process:  Coherent  Orientation:  Full (Time, Place, and Person)  Thought Content:  Logical   Suicidal Thoughts:  No  Homicidal Thoughts:  No  Memory:  Immediate;   Good Recent;   Good Remote;   Good  Judgement:  Fair  Insight:  Lacking  Psychomotor Activity:  Normal  Concentration:  Concentration: Good and Attention Span: Good  Recall:  Good  Fund of Knowledge:  Good  Language:  Good  Akathisia:  Negative  Handed:  Right  AIMS (if indicated):     Assets:  Communication Skills Leisure Time Social Support  ADL's:  Intact  Cognition:  WNL  Sleep:   Insomnia     Treatment Plan Summary: Medication management and Plan Patient does meet criteria for psychiatric inpatient admission.  Disposition: Recommend psychiatric Inpatient admission when medically cleared. Supportive therapy provided about ongoing stressors.  Gillermo Murdoch, NP 08/25/2019 5:52 AM

## 2019-08-25 NOTE — BH Assessment (Signed)
Assessment Note  Tracy Poole is an 23 y.o. female who presents to the ER under IVC, after her mother petitioned. Per ER notes, patient stop taking her medications. She believes the neighbor place a spell on her and they are now controlling her body.  During the interview, the patient was calm and attempted to participate  in the assessment. However, patient was having thought blocking and difficulty answering the questions. Patient indicated she knew the answers but was noticeable she was unable to think of the answers. However, patient shared she needed help, but when was didn't known what kind of help she needed. At one point she said her medications are working but latter she shared they wasn't. She also shared she was taking her medications as prescribed but then she shared she wasn't taking them. "I'm confused. I don't know. I need help."  Writer was unable to obtained more about the patient at the time of the interview. Patient was unable to share the events that led to her coming the ER and writer was unable to reach the patient's mother.  Diagnosis: Schizophrenia   Past Medical History:  Past Medical History:  Diagnosis Date  . Anxiety   . Asthma   . Depression     Past Surgical History:  Procedure Laterality Date  . CESAREAN SECTION    . EUSTACHIAN TUBE DILATION    . HERNIA REPAIR      Family History:  Family History  Problem Relation Age of Onset  . Bladder Cancer Neg Hx   . Kidney cancer Neg Hx     Social History:  reports that she has been smoking cigarettes. She has been smoking about 1.00 pack per day. She has never used smokeless tobacco. She reports current alcohol use. She reports that she does not use drugs.  Additional Social History:  Alcohol / Drug Use Pain Medications: See PTA Prescriptions: See PTA Over the Counter: See PTA History of alcohol / drug use?: No history of alcohol / drug abuse Longest period of sobriety (when/how long): Patient reports  of no use  CIWA: CIWA-Ar BP: (!) 149/85 Pulse Rate: 95 COWS:    Allergies: No Known Allergies  Home Medications: (Not in a hospital admission)   OB/GYN Status:  No LMP recorded (lmp unknown). (Menstrual status: Irregular Periods).  General Assessment Data Location of Assessment: [facility] ED TTS Assessment: In system Is this a Tele or Face-to-Face Assessment?: Face-to-Face Is this an Initial Assessment or a Re-assessment for this encounter?: Initial Assessment Patient Accompanied by:: N/A Language Other than English: No Living Arrangements: Other (Comment)(Private Home) What gender do you identify as?: Female Marital status: Single Pregnancy Status: No Living Arrangements: Other (Comment)(Private HOme) Can pt return to current living arrangement?: Yes Admission Status: Involuntary Petitioner: Family member Is patient capable of signing voluntary admission?: No(Under IVC) Referral Source: Self/Family/Friend Insurance type: Medicaid  Medical Screening Exam ([place]) Medical Exam completed: Yes  Crisis Care Plan Living Arrangements: Other (Comment)(Private HOme) Legal Guardian: Other:(Self) Name of Psychiatrist: Unknown-patient Name of Therapist: Unknown-Patient didn't know  Education Status Is patient currently in school?: No Is the patient employed, unemployed or receiving disability?: Unemployed  Risk to self with the past 6 months Suicidal Ideation: No Has patient been a risk to self within the past 6 months prior to admission? : No Suicidal Intent: No Has patient had any suicidal intent within the past 6 months prior to admission? : No Is patient at risk for suicide?: No Suicidal Plan?:  No Has patient had any suicidal plan within the past 6 months prior to admission? : No Access to Means: No What has been your use of drugs/alcohol within the last 12 months?: Reports of none Previous Attempts/Gestures: No How many times?: 0 Other Self Harm Risks: Reports  of none Triggers for Past Attempts: None known Intentional Self Injurious Behavior: None Family Suicide History: No Recent stressful life event(s): Other (Comment)(Ran out of medications) Persecutory voices/beliefs?: No Depression: Yes Depression Symptoms: Insomnia(Trouble falling and staying asleep) Substance abuse history and/or treatment for substance abuse?: No Suicide prevention information given to non-admitted patients: Not applicable  Risk to Others within the past 6 months Homicidal Ideation: No Does patient have any lifetime risk of violence toward others beyond the six months prior to admission? : No Thoughts of Harm to Others: No Current Homicidal Intent: No Current Homicidal Plan: No Access to Homicidal Means: No Identified Victim: Reports of none History of harm to others?: No Assessment of Violence: None Noted Violent Behavior Description: Reports of none Does patient have access to weapons?: No Criminal Charges Pending?: No Does patient have a court date: No Is patient on probation?: No  Psychosis Hallucinations: None noted Delusions: Unspecified  Mental Status Report Appearance/Hygiene: Unremarkable, In scrubs Eye Contact: Fair Motor Activity: Freedom of movement, Unremarkable Speech: Unremarkable, Slow Level of Consciousness: Alert Mood: Sad, Threatening Affect: Blunted Anxiety Level: None Thought Processes: Thought Blocking, Relevant Judgement: Impaired Orientation: Person, Place Obsessive Compulsive Thoughts/Behaviors: None  Cognitive Functioning Concentration: Decreased Memory: Recent Impaired, Remote Impaired Is patient IDD: No Insight: Poor Impulse Control: Fair Appetite: Fair Have you had any weight changes? : No Change Sleep: Decreased Total Hours of Sleep: 4(Trouble falling and staying asleep) Vegetative Symptoms: None  ADLScreening [facility] Assessment Services) Patient's cognitive ability adequate to safely complete daily activities?:  Yes Patient able to express need for assistance with ADLs?: Yes Independently performs ADLs?: Yes (appropriate for developmental age)  Prior Inpatient Therapy Prior Inpatient Therapy: Yes Prior Therapy Dates: 02/2018 & 03/2017 [facility] Reason for Treatment: Schizophrenia  Prior Outpatient Therapy Prior Outpatient Therapy: Yes Prior Therapy Dates: Current Prior Therapy Facilty/Provider(s): Unable to remember the name Reason for Treatment: Schizophrenia  Does patient have an ACCT team?: No Does patient have Intensive In-House Services?  : No Does patient have [facility] services? : No Does patient have P4CC services?: No  ADL Screening (condition at time of admission) Patient's cognitive ability adequate to safely complete daily activities?: Yes Is the patient deaf or have difficulty hearing?: No Does the patient have difficulty seeing, even when wearing glasses/contacts?: No Does the patient have difficulty concentrating, remembering, or making decisions?: No Patient able to express need for assistance with ADLs?: Yes Does the patient have difficulty dressing or bathing?: No Independently performs ADLs?: Yes (appropriate for developmental age) Does the patient have difficulty walking or climbing stairs?: No Weakness of Legs: None Weakness of Arms/Hands: None  Home Assistive Devices/Equipment Home Assistive Devices/Equipment: None  Therapy Consults (therapy consults require a physician order) PT Evaluation Needed: No OT Evalulation Needed: No SLP Evaluation Needed: No Abuse/Neglect Assessment (Assessment to be complete while patient is alone) Abuse/Neglect Assessment Can Be Completed: Yes Physical Abuse: Denies Verbal Abuse: Denies Sexual Abuse: Denies Exploitation of patient/patient's resources: Denies Self-Neglect: Denies Values / Beliefs Cultural Requests During Hospitalization: None Spiritual Requests During Hospitalization:  None Consults Spiritual Care Consult Needed: No Social Work Consult Needed: No Merchant navy officerAdvance Directives (For Healthcare) Does Patient Have a Medical Advance Directive?: No  Child/Adolescent Assessment Running Away Risk: Denies(Patient is an adult)  Disposition:  Disposition Initial Assessment Completed for this Encounter: Yes  On Site Evaluation by:   Reviewed with Physician:    Lilyan Gilford MS, LCAS, [facility], [facility] Therapeutic Triage Specialist 08/25/2019 4:33 AM

## 2019-08-25 NOTE — ED Triage Notes (Signed)
Pt presents to ED via [place] [employer] after IVC papers were taken out by her mom. Pt has been off her behavioral meds for several weeks and has been hallucinating. Pt thinks that her neighbor has been doing voodoo and has taken over her moms body. Pt answering questions without difficulty. Denies SI or HI. Denies auditory or visual hallucinations.

## 2019-08-26 LAB — BASIC METABOLIC PANEL
Anion gap: 10 (ref 5–15)
BUN: 6 mg/dL (ref 6–20)
CO2: 23 mmol/L (ref 22–32)
Calcium: 9.5 mg/dL (ref 8.9–10.3)
Chloride: 105 mmol/L (ref 98–111)
Creatinine, Ser: 0.81 mg/dL (ref 0.44–1.00)
GFR calc Af Amer: >60 mL/min (ref >60)
GFR calc non Af Amer: >60 mL/min (ref >60)
Glucose, Bld: 112 mg/dL — ABNORMAL HIGH (ref 70–99)
Potassium: 3.7 mmol/L (ref 3.5–5.1)
Sodium: 138 mmol/L (ref 135–145)

## 2019-08-26 LAB — CBC WITH DIFFERENTIAL/PLATELET
Abs Immature Granulocytes: 0.09 10*3/uL — ABNORMAL HIGH (ref 0.00–0.07)
Basophils Absolute: 0.1 10*3/uL (ref 0.0–0.1)
Basophils Relative: 1 %
Eosinophils Absolute: 0.1 10*3/uL (ref 0.0–0.5)
Eosinophils Relative: 1 %
HCT: 42.8 % (ref 36.0–46.0)
Hemoglobin: 13.6 g/dL (ref 12.0–15.0)
Immature Granulocytes: 1 %
Lymphocytes Relative: 20 %
Lymphs Abs: 2.2 10*3/uL (ref 0.7–4.0)
MCH: 28.6 pg (ref 26.0–34.0)
MCHC: 31.8 g/dL (ref 30.0–36.0)
MCV: 90.1 fL (ref 80.0–100.0)
Monocytes Absolute: 0.5 10*3/uL (ref 0.1–1.0)
Monocytes Relative: 5 %
Neutro Abs: 8 10*3/uL — ABNORMAL HIGH (ref 1.7–7.7)
Neutrophils Relative %: 72 %
Platelets: 183 10*3/uL (ref 150–400)
RBC: 4.75 MIL/uL (ref 3.87–5.11)
RDW: 13.4 % (ref 11.5–15.5)
WBC: 11 10*3/uL — ABNORMAL HIGH (ref 4.0–10.5)
nRBC: 0 % (ref 0.0–0.2)

## 2019-08-26 LAB — GLUCOSE, CAPILLARY: Glucose-Capillary: 107 mg/dL — ABNORMAL HIGH (ref 70–99)

## 2019-08-26 LAB — CK: Total CK: 148 U/L (ref 38–234)

## 2019-08-26 MED ORDER — POTASSIUM CHLORIDE CRYS ER 10 MEQ PO TBCR
10.0000 meq | EXTENDED_RELEASE_TABLET | Freq: Two times a day (BID) | ORAL | Status: AC
  Administered 2019-08-26 – 2019-08-27 (×3): 10 meq via ORAL
  Filled 2019-08-26 (×5): qty 1

## 2019-08-26 NOTE — Progress Notes (Signed)
DAR NOTE: Patient presents with calm affect and pleasant mood.  Denies suicidal thoughts, auditory and visual hallucinations.  Rates depression at 0, hopelessness at 0, and anxiety at 0.  Maintained on routine safety checks.  Medications given as prescribed.  Support and encouragement offered as needed.  States goal for today is "discharge."  Patient visible in milieu with minimal interaction.  patient is safe on and off the unit.  Offered no complaint.

## 2019-08-26 NOTE — Progress Notes (Signed)
Adult Psychoeducational Group Note  Date:  08/26/2019 Time:  9:45 PM  Group Topic/Focus:  Wrap-Up Group:   The focus of this group is to help patients review their daily goal of treatment and discuss progress on daily workbooks.  Participation Level:  Active  Participation Quality:  Appropriate  Affect:  Appropriate  Cognitive:  Appropriate  Insight: Improving  Engagement in Group:  Engaged  Modes of Intervention:  Education and Support  Additional Comments:  Patient attended and participated in group tonight.  Salley [name] [facility] 08/26/2019, 9:45 PM

## 2019-08-26 NOTE — Progress Notes (Signed)
D: Pt denies SI/HI/AVH. Pt was irritable earlier this evening wanting to go to the [place] had her mother call [facility] and talk to previous nurse. Pt continued to express her desire to go to the hospital so much she was crying almost to the point of hysteria. Pt was deescalated and questioned about what really was going on. Pt continued to be evasive and guarded, but it finally came out that pt was paranoid and did not like being alone. Pt did not want to sleep in her room by herself. Pt was asked if we found her a roommate would that make her feel better. Pt was moved in the room with [facility] and pt was pleasant and did not talk about going to the hospital anymore.    A: Pt was offered support and encouragement. Pt was given scheduled medications. Pt was encourage to attend groups. Q 15 minute checks were done for safety.   R:Pt attends groups and interacts  with peers and staff. Pt is taking medication. Pt has no complaints.Pt receptive to treatment and safety maintained on unit.

## 2019-08-26 NOTE — Progress Notes (Signed)
[facility] MD Progress Note  08/26/2019 10:53 AM Tracy Poole  MRN:  [id number] Subjective:    Patient is seen in morning rounds and she seen again in treatment team and she expresses her goal is to "get home soon.  She is compliant with her medications but lacks insight she believes that she still has "special powers" and laughs about it when I bring up the idea that this may be a function of her illness rather than a true state of being but she is overall cordial and cooperative but still delusional and she denies current auditory or visual hallucinations and she denies thoughts of harming self or others she has no involuntary movements Principal Problem: Schizophrenic disorder Diagnosis: Active Problems:   Schizophrenia ([place])  Total Time spent with patient: 20 minutes  Past Psychiatric History: Extensive  Past Medical History:  Past Medical History:  Diagnosis Date  . Anxiety   . Asthma   . Depression     Past Surgical History:  Procedure Laterality Date  . CESAREAN SECTION    . EUSTACHIAN TUBE DILATION    . HERNIA REPAIR     Family History:  Family History  Problem Relation Age of Onset  . Bladder Cancer Neg Hx   . Kidney cancer Neg Hx    Family Psychiatric  History: Denies Social History:  Social History   Substance and Sexual Activity  Alcohol Use Yes   Comment: occasionallly     Social History   Substance and Sexual Activity  Drug Use No    Social History   Socioeconomic History  . Marital status: Single    Spouse name: Not on file  . Number of children: Not on file  . Years of education: Not on file  . Highest education level: Not on file  Occupational History  . Not on file  Social Needs  . Financial resource strain: Not on file  . Food insecurity    Worry: Not on file    Inability: Not on file  . Transportation needs    Medical: Not on file    Non-medical: Not on file  Tobacco Use  . Smoking status: Current Every Day Smoker    Packs/day: 1.00     Types: Cigarettes  . Smokeless tobacco: Never Used  Substance and Sexual Activity  . Alcohol use: Yes    Comment: occasionallly  . Drug use: No  . Sexual activity: Yes    Birth control/protection: None  Lifestyle  . Physical activity    Days per week: Not on file    Minutes per session: Not on file  . Stress: Not on file  Relationships  . Social Herbalist on phone: Not on file    Gets together: Not on file    Attends religious service: Not on file    Active member of club or organization: Not on file    Attends meetings of clubs or organizations: Not on file    Relationship status: Not on file  Other Topics Concern  . Not on file  Social History Narrative  . Not on file   Additional Social History:                         Sleep: Good  Appetite:  Good  Current Medications: Current Facility-Administered Medications  Medication Dose Route Frequency Provider Last Rate Last Dose  . acetaminophen (TYLENOL) tablet 650 mg  650 mg Oral Q6H PRN Hall Busing,  Gwendolyn Fill, FNP      . alum & mag hydroxide-simeth (MAALOX/MYLANTA) 200-200-20 MG/5ML suspension 30 mL  30 mL Oral Q4H PRN Patrcia Dolly, FNP      . benztropine (COGENTIN) tablet 0.5 mg  0.5 mg Oral BID Malvin Johns, MD   0.5 mg at 08/26/19 0802  . gabapentin (NEURONTIN) capsule 300 mg  300 mg Oral TID Malvin Johns, MD   300 mg at 08/26/19 0802  . LORazepam (ATIVAN) tablet 2 mg  2 mg Oral Q4H PRN Malvin Johns, MD   2 mg at 08/26/19 0215   Or  . LORazepam (ATIVAN) injection 2 mg  2 mg Intramuscular Q4H PRN Malvin Johns, MD      . ziprasidone (GEODON) injection 20 mg  20 mg Intramuscular Q12H PRN Patrcia Dolly, FNP       And  . LORazepam (ATIVAN) tablet 1 mg  1 mg Oral PRN Patrcia Dolly, FNP      . magnesium hydroxide (MILK OF MAGNESIA) suspension 30 mL  30 mL Oral Daily PRN Patrcia Dolly, FNP      . omega-3 acid ethyl esters (LOVAZA) capsule 1 g  1 g Oral BID Malvin Johns, MD   1 g at 08/26/19 0802  . risperiDONE  (RISPERDAL) tablet 3 mg  3 mg Oral BID Malvin Johns, MD   3 mg at 08/26/19 0802  . temazepam (RESTORIL) capsule 30 mg  30 mg Oral QHS Malvin Johns, MD   30 mg at 08/25/19 2212    Lab Results:  Results for orders placed or performed during the hospital encounter of 08/25/19 (from the past 48 hour(s))  Comprehensive metabolic panel     Status: Abnormal   Collection Time: 08/25/19 12:19 AM  Result Value Ref Range   Sodium 137 135 - 145 mmol/L   Potassium 3.3 (L) 3.5 - 5.1 mmol/L   Chloride 104 98 - 111 mmol/L   CO2 24 22 - 32 mmol/L   Glucose, Bld 100 (H) 70 - 99 mg/dL   BUN 5 (L) 6 - 20 mg/dL   Creatinine, Ser 2.94 0.44 - 1.00 mg/dL   Calcium 9.0 8.9 - 76.5 mg/dL   Total Protein 7.4 6.5 - 8.1 g/dL   Albumin 4.0 3.5 - 5.0 g/dL   AST 32 15 - 41 U/L   ALT 52 (H) 0 - 44 U/L   Alkaline Phosphatase 93 38 - 126 U/L   Total Bilirubin 1.1 0.3 - 1.2 mg/dL   GFR calc non Af Amer >60 >60 mL/min   GFR calc Af Amer >60 >60 mL/min   Anion gap 9 5 - 15    Comment: Performed at [facility], [address] [place], [place] [address]  Ethanol     Status: None   Collection Time: 08/25/19 12:19 AM  Result Value Ref Range   Alcohol, Ethyl (B) <10 <10 mg/dL    Comment: (NOTE) Lowest detectable limit for serum alcohol is 10 mg/dL. For medical purposes only. Performed at [facility], [address] [place], [place] [address]   Salicylate level     Status: None   Collection Time: 08/25/19 12:19 AM  Result Value Ref Range   Salicylate Lvl <7.0 2.8 - 30.0 mg/dL    Comment: Performed at [facility], [address] [place], [place] [address]  Acetaminophen level     Status: Abnormal   Collection Time: 08/25/19 12:19 AM  Result Value Ref Range   Acetaminophen (Tylenol),  Serum <10 (L) 10 - 30 ug/mL    Comment: (NOTE) Therapeutic concentrations vary significantly. A range of 10-30 ug/mL  may be an effective concentration for many patients. However, some  are best  treated at concentrations outside of this range. Acetaminophen concentrations >150 ug/mL at 4 hours after ingestion  and >50 ug/mL at 12 hours after ingestion are often associated with  toxic reactions. Performed at [facility], [address] [place], [place] [address]   cbc     Status: Abnormal   Collection Time: 08/25/19 12:19 AM  Result Value Ref Range   WBC 12.3 (H) 4.0 - 10.5 K/uL   RBC 4.65 3.87 - 5.11 MIL/uL   Hemoglobin 13.4 12.0 - 15.0 g/dL   HCT 60.440.0 54.036.0 - 98.146.0 %   MCV 86.0 80.0 - 100.0 fL   MCH 28.8 26.0 - 34.0 pg   MCHC 33.5 30.0 - 36.0 g/dL   RDW 19.113.5 47.811.5 - 29.515.5 %   Platelets 203 150 - 400 K/uL   nRBC 0.0 0.0 - 0.2 %    Comment: Performed at [facility], [address] [place], [place] [address]  Urine Drug Screen, Qualitative     Status: Abnormal   Collection Time: 08/25/19 12:19 AM  Result Value Ref Range   Tricyclic, Ur Screen NONE DETECTED NONE DETECTED   Amphetamines, Ur Screen NONE DETECTED NONE DETECTED   MDMA (Ecstasy)Ur Screen NONE DETECTED NONE DETECTED   Cocaine Metabolite,Ur [place] NONE DETECTED NONE DETECTED   Opiate, Ur Screen NONE DETECTED NONE DETECTED   Phencyclidine (PCP) Ur S NONE DETECTED NONE DETECTED   Cannabinoid 50 Ng, Ur [place] POSITIVE (A) NONE DETECTED   Barbiturates, Ur Screen NONE DETECTED NONE DETECTED   Benzodiazepine, Ur Scrn NONE DETECTED NONE DETECTED   Methadone Scn, Ur NONE DETECTED NONE DETECTED    Comment: (NOTE) Tricyclics + metabolites, urine    Cutoff 1000 ng/mL Amphetamines + metabolites, urine  Cutoff 1000 ng/mL MDMA (Ecstasy), urine              Cutoff 500 ng/mL Cocaine Metabolite, urine          Cutoff 300 ng/mL Opiate + metabolites, urine        Cutoff 300 ng/mL Phencyclidine (PCP), urine         Cutoff 25 ng/mL Cannabinoid, urine                 Cutoff 50 ng/mL Barbiturates + metabolites, urine  Cutoff 200 ng/mL Benzodiazepine, urine              Cutoff 200 ng/mL Methadone, urine                    Cutoff 300 ng/mL The urine drug screen provides only a preliminary, unconfirmed analytical test result and should not be used for non-medical purposes. Clinical consideration and professional judgment should be applied to any positive drug screen result due to possible interfering substances. A more specific alternate chemical method must be used in order to obtain a confirmed analytical result. Gas chromatography / mass spectrometry (GC/MS) is the preferred confirmat ory method. Performed at [facility], [address] [place], [place] [address]   Pregnancy, urine POC     Status: None   Collection Time: 08/25/19 12:25 AM  Result Value Ref Range   Preg Test, Ur NEGATIVE NEGATIVE    Comment:        THE SENSITIVITY OF THIS METHODOLOGY IS >24 mIU/mL  SARS Coronavirus 2 [facility] order, Performed in [facility] hospital lab) Nasopharyngeal Nasopharyngeal Swab     Status: None   Collection Time: 08/25/19  7:11 AM   Specimen: Nasopharyngeal Swab  Result Value Ref Range   SARS Coronavirus 2 NEGATIVE NEGATIVE    Comment: (NOTE) If result is NEGATIVE SARS-CoV-2 target nucleic acids are NOT DETECTED. The SARS-CoV-2 RNA is generally detectable in upper and lower  respiratory specimens during the acute phase of infection. The lowest  concentration of SARS-CoV-2 viral copies this assay can detect is 250  copies / mL. A negative result does not preclude SARS-CoV-2 infection  and should not be used as the sole basis for treatment or other  patient management decisions.  A negative result may occur with  improper specimen collection / handling, submission of specimen other  than nasopharyngeal swab, presence of viral mutation(s) within the  areas targeted by this assay, and inadequate number of viral copies  (<250 copies / mL). A negative result must be combined with clinical  observations, patient history, and epidemiological information. If result is POSITIVE SARS-CoV-2  target nucleic acids are DETECTED. The SARS-CoV-2 RNA is generally detectable in upper and lower  respiratory specimens dur ing the acute phase of infection.  Positive  results are indicative of active infection with SARS-CoV-2.  Clinical  correlation with patient history and other diagnostic information is  necessary to determine patient infection status.  Positive results do  not rule out bacterial infection or co-infection with other viruses. If result is PRESUMPTIVE POSTIVE SARS-CoV-2 nucleic acids MAY BE PRESENT.   A presumptive positive result was obtained on the submitted specimen  and confirmed on repeat testing.  While 2019 novel coronavirus  (SARS-CoV-2) nucleic acids may be present in the submitted sample  additional confirmatory testing may be necessary for epidemiological  and / or clinical management purposes  to differentiate between  SARS-CoV-2 and other Sarbecovirus currently known to infect humans.  If clinically indicated additional testing with an alternate test  methodology [phone]) is advised. The SARS-CoV-2 RNA is generally  detectable in upper and lower respiratory sp ecimens during the acute  phase of infection. The expected result is Negative. Fact Sheet for Patients:  BoilerBrush.com.cy Fact Sheet for Healthcare Providers: https://pope.com/ This test is not yet approved or cleared by the [place] FDA and has been authorized for detection and/or diagnosis of SARS-CoV-2 by FDA under an Emergency Use Authorization (EUA).  This EUA will remain in effect (meaning this test can be used) for the duration of the COVID-19 declaration under Section 564(b)(1) of the Act, 21 U.S.C. section 360bbb-3(b)(1), unless the authorization is terminated or revoked sooner. Performed at [facility], [address] [place], [place] [address]     Blood Alcohol level:  Lab Results  Component Value Date   [facility] <10  08/25/2019   ETH <10 03/24/2018    Metabolic Disorder Labs: Lab Results  Component Value Date   HGBA1C 4.9 03/27/2018   MPG 93.93 03/27/2018   MPG 100 03/28/2017   Lab Results  Component Value Date   PROLACTIN 53.4 (H) 03/28/2017   Lab Results  Component Value Date   CHOL 132 03/27/2018   TRIG 58 03/27/2018   HDL 29 (L) 03/27/2018   CHOLHDL 4.6 03/27/2018   VLDL 12 03/27/2018   LDLCALC 91 03/27/2018   LDLCALC 77 03/28/2017    Physical Findings: AIMS: Facial and Oral Movements Muscles of Facial Expression: None, normal Lips and Perioral Area: None, normal  Jaw: None, normal Tongue: None, normal,Extremity Movements Upper (arms, wrists, hands, fingers): None, normal Lower (legs, knees, ankles, toes): None, normal, Trunk Movements Neck, shoulders, hips: None, normal, Overall Severity Severity of abnormal movements (highest score from questions above): None, normal Incapacitation due to abnormal movements: None, normal Patient's awareness of abnormal movements (rate only patient's report): No Awareness, Dental Status Current problems with teeth and/or dentures?: No Does patient usually wear dentures?: No  CIWA:    COWS:     Musculoskeletal: Strength & Muscle Tone: within normal limits Gait & Station: normal Patient leans: N/A  Psychiatric Specialty Exam: Physical Exam  ROS  Blood pressure (!) 110/54, pulse (!) 108, temperature 98.7 F (37.1 C), temperature source Oral, resp. rate 16, height  (1.626 m), weight 73.5 kg, SpO2 100 %.Body mass index is 27.81 kg/m.  General Appearance: Casual  Eye Contact:  Fair  Speech:  Clear and Coherent  Volume:  Decreased  Mood:  Euthymic  Affect:  Congruent  Thought Process:  Coherent, Irrelevant and Descriptions of Associations: Circumstantial  Orientation:  Full (Time, Place, and Person)  Thought Content:  Illogical, Delusions, Paranoid Ideation and Rumination  Suicidal Thoughts:  No  Homicidal Thoughts:  No  Memory:   Immediate;   Fair Recent;   Fair  Judgement:  Poor  Insight:  Shallow  Psychomotor Activity:  Normal  Concentration:  Concentration: Fair and Attention Span: Fair  Recall:  [employer] of Knowledge:  Fair  Language:  Fair  Akathisia:  Negative  Handed:  Right  AIMS (if indicated):     Assets:  Physical Health Resilience  ADL's:  Intact  Cognition:  WNL  Sleep:  Number of Hours: 5     Treatment Plan Summary: Daily contact with patient to assess and evaluate symptoms and progress in treatment and Medication management  Continue reality based therapy continue antipsychotic therapy discussed long-acting injectables for his risk-benefit side effect no change in plans or precautions today  Jeancarlo Leffler, MD 08/26/2019, 10:53 AM

## 2019-08-26 NOTE — Progress Notes (Signed)
Recreation Therapy Notes  INPATIENT RECREATION THERAPY ASSESSMENT  Patient Details Name: Tracy Poole MRN: [id number] DOB: Apr 19, 1996 Today's Date: 08/26/2019       Information Obtained From: Patient  Able to Participate in Assessment/Interview: Yes  Patient Presentation: Responsive  Reason for Admission (Per Patient): Other (Comments)(Argument wtih mom)  Patient Stressors: Other (Comment)(None identified)  Coping Skills:   TV, Sports, Exercise, Music, Deep Breathing, Meditate, Art, Talk, Prayer, Avoidance, Read, Hot Bath/Shower  Leisure Interests (2+):  Games - Video games, Community - Other (Comment), Individual - Other (Comment)(Shopping; Ride dirt bikes and four wheelers)  Frequency of Recreation/Participation: Engineer, maintenance (IT)- Actor)  Awareness of Community Resources:  Yes  Community Resources:  Park, Other (Comment)([place])  Current Use: Yes  If no, Barriers?:    Expressed Interest in [place]: No  County of Residence:  [place] under[place]  Patient Main Form of Transportation: Car  Patient Strengths:  Strong; Very smart  Patient Identified Areas of Improvement:  Learn how to avoid things; Stop letting stuff bother me  Patient Goal for Hospitalization:  "to go home"  Current SI (including self-harm):  No  Current HI:  No  Current AVH: No  Staff Intervention Plan: Group Attendance, Collaborate with Interdisciplinary Treatment Team  Consent to Intern Participation: N/A    Victorino Sparrow, LRT/CTRS  Ria Comment, Jader Desai A 08/26/2019, 12:00 PM

## 2019-08-26 NOTE — BHH Group Notes (Signed)
Occupational Therapy Group Note  Date:  08/26/2019 Time:  3:09 PM  Group Topic/Focus:  Self Esteem Action Plan:   The focus of this group is to help patients create a plan to continue to build self-esteem after discharge.  Participation Level:  Minimal  Participation Quality:  Inattentive  Affect:  Flat and Tearful  Cognitive:  Alert  Insight: Lacking  Engagement in Group:  None  Modes of Intervention:  Activity, Education, Exploration and Socialization  Additional Comments:    S: none stated  O:Education given on self esteem and how it relates to daily experiences. Pt asked to give definition of self esteem, and current rating of self esteem. Positive and negative contributors of self esteem to be brainstormed within group in relation to personal experiences. Positive thinking activity then completed for pt to identify several positive traits about themselves. Pt asked to share at end of session.   A: Pt not at all engaged in activity. Sitting in group with eyes closed  P: OT group will be x1 per week while pt inpatient  Zenovia Jarred, MSOT, OTR/L Behavioral Health OT/ Acute Relief OT PHP Office: [place] 08/26/2019, 3:09 PM

## 2019-08-26 NOTE — Progress Notes (Signed)
Recreation Therapy Notes  Date: 9.30.20 Time: 0945 Location: [address] Day Room  Group Topic: Communication, Team Building, Problem Solving  Goal Area(s) Addresses:  Patient will effectively work with peer towards shared goal.  Patient will identify skill used to make activity successful.  Patient will identify how skills used during activity can be used to reach post d/c goals.   Behavioral Response:  Minimal, Drowsy  Intervention: STEM Activity   Activity: [employer].  In groups, patients were to build a free standing tower as tall as possible.  During the course of the activity, LRT would inform patients of budget cuts.  During these budget cuts, patients will have to put one hand behind their backs and not speak.  With the addition of funds, patients will be able to return to using both hands and speaking.     Education: Education officer, community, Dentist.   Education Outcome: Acknowledges education  Clinical Observations/Feedback:  Pt attempted to work with peers to complete activity.  Pt was drowsy and kept falling asleep.  Pt expressed she didn't sleep well.     Victorino Sparrow, LRT/CTRS         Victorino Sparrow A 08/26/2019 10:48 AM

## 2019-08-26 NOTE — Progress Notes (Addendum)
Patient ID: Tracy Poole, female   DOB: 03/21/1996, 23 y.o.   MRN: [id number] 08/26/2019 5,50 PM   Patient complains of dizziness/lightheadedness. Does not endorse rigidity and  no hypertonia or EPS/cogwheeling noted  Gait steady. Vitals - sitting pulse 100, BP 127/70, standing pulse 112, BP 105/72. Temp 99.6 Pulse Ox 100 % at room air . CBG 107. She presents fully alert and attentive, she denies headache, denies neck pain, denies odynophagia, denies chest pain, denies shortness of breath, denies cough, denies vomiting, denies diarrhea.  Does endorse some dysuria.  Does not endorse any rashes or erythema Denies suicidal ideations. 9/29 Labs reviewed . Mild hypokalemia ( 3.3) , BUN 5, Creat 0.8. WBC 12.3 ( no diff) . SARS Covid negative 9/29.  At this time dizziness most likely related to medication side effect.  She has a low grade temp which could be related to UTI based on her reported symptoms.  Current presentation not suggestive of NMS as no rigidity/hypertonia and vitals stable. Would hold risperidone/restoril tonight at these medications may be contributing to dizziness. KDUR 10 Meq BID x 3 doses  Encourage PO fluids  Check Vitals, including temp, Q 6 hours  Will order CBC, diff , BMP, UA, Ucx. Will also check EKG to monitor QTc  Have reviewed with staff , who have contacted Infection Control- no indication for repeat COVID testing at this time. Patient encouraged to keep mask on, observe distancing precautions .  Will sign out to Nighttime Provider   Gabriel Earing MD

## 2019-08-26 NOTE — Tx Team (Signed)
Interdisciplinary Treatment and Diagnostic Plan Update  08/26/2019 Time of Session: 10:00am Tracy Poole MRN: [id number]  Principal Diagnosis: <principal problem not specified>  Secondary Diagnoses: Active Problems:   Schizophrenia ([place])   Current Medications:  Current Facility-Administered Medications  Medication Dose Route Frequency Provider Last Rate Last Dose  . acetaminophen (TYLENOL) tablet 650 mg  650 mg Oral Q6H PRN Emmaline Kluver, FNP      . alum & mag hydroxide-simeth (MAALOX/MYLANTA) 200-200-20 MG/5ML suspension 30 mL  30 mL Oral Q4H PRN Emmaline Kluver, FNP      . benztropine (COGENTIN) tablet 0.5 mg  0.5 mg Oral BID Johnn Hai, MD   0.5 mg at 08/26/19 0802  . gabapentin (NEURONTIN) capsule 300 mg  300 mg Oral TID Johnn Hai, MD   300 mg at 08/26/19 0802  . LORazepam (ATIVAN) tablet 2 mg  2 mg Oral Q4H PRN Johnn Hai, MD   2 mg at 08/26/19 0215   Or  . LORazepam (ATIVAN) injection 2 mg  2 mg Intramuscular Q4H PRN Johnn Hai, MD      . ziprasidone (GEODON) injection 20 mg  20 mg Intramuscular Q12H PRN Emmaline Kluver, FNP       And  . LORazepam (ATIVAN) tablet 1 mg  1 mg Oral PRN Emmaline Kluver, FNP      . magnesium hydroxide (MILK OF MAGNESIA) suspension 30 mL  30 mL Oral Daily PRN Emmaline Kluver, FNP      . omega-3 acid ethyl esters (LOVAZA) capsule 1 g  1 g Oral BID Johnn Hai, MD   1 g at 08/26/19 0802  . risperiDONE (RISPERDAL) tablet 3 mg  3 mg Oral BID Johnn Hai, MD   3 mg at 08/26/19 0802  . temazepam (RESTORIL) capsule 30 mg  30 mg Oral QHS Johnn Hai, MD   30 mg at 08/25/19 2212   PTA Medications: Medications Prior to Admission  Medication Sig Dispense Refill Last Dose  . ABILIFY 5 MG tablet Take 1 tablet (5 mg total) by mouth daily for 14 days. 14 tablet 0   . meloxicam (MOBIC) 15 MG tablet Take 1 tablet (15 mg total) by mouth daily. 30 tablet 0   . paliperidone (INVEGA SUSTENNA) 156 MG/ML SUSY injection Inject 1 mL (156 mg total) into the muscle every  28 (twenty-eight) days. 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   Physician Treatment Plan for Primary Diagnosis: <principal problem not specified> Long Term Goal(s): Improvement in symptoms so as ready for discharge Improvement in symptoms so as ready for discharge   Short Term Goals: Ability to identify changes in lifestyle to reduce recurrence of condition will improve Ability to verbalize feelings will improve Ability to disclose and discuss suicidal ideas Ability to demonstrate self-control will improve Ability to identify and develop effective coping behaviors will improve Ability to maintain clinical measurements within normal limits will improve Compliance with prescribed medications will improve Ability to identify changes in lifestyle to reduce recurrence of condition will improve Ability to verbalize feelings will improve Ability to identify and develop effective coping behaviors will improve Ability to maintain clinical measurements within normal limits will improve  Medication Management: Evaluate patient's response, side effects, and tolerance of medication regimen.  Therapeutic Interventions: 1  to 1 sessions, Unit Group sessions and Medication administration.  Evaluation of Outcomes: Progressing  Physician Treatment Plan for Secondary Diagnosis: Active Problems:   Schizophrenia (HCC)  Long Term Goal(s): Improvement in symptoms so as ready for discharge Improvement in symptoms so as ready for discharge   Short Term Goals: Ability to identify changes in lifestyle to reduce recurrence of condition will improve Ability to verbalize feelings will improve Ability to disclose and  discuss suicidal ideas Ability to demonstrate self-control will improve Ability to identify and develop effective coping behaviors will improve Ability to maintain clinical measurements within normal limits will improve Compliance with prescribed medications will improve Ability to identify changes in lifestyle to reduce recurrence of condition will improve Ability to verbalize feelings will improve Ability to identify and develop effective coping behaviors will improve Ability to maintain clinical measurements within normal limits will improve     Medication Management: Evaluate patient's response, side effects, and tolerance of medication regimen.  Therapeutic Interventions: 1 to 1 sessions, Unit Group sessions and Medication administration.  Evaluation of Outcomes: Progressing   RN Treatment Plan for Primary Diagnosis: <principal problem not specified> Long Term Goal(s): Knowledge of disease and therapeutic regimen to maintain health will improve  Short Term Goals: Ability to participate in decision making will improve, Ability to verbalize feelings will improve, Ability to disclose and discuss suicidal ideas, Ability to identify and develop effective coping behaviors will improve and Compliance with prescribed medications will improve  Medication Management: RN will administer medications as ordered by provider, will assess and evaluate patient's response and provide education to patient for prescribed medication. RN will report any adverse and/or side effects to prescribing provider.  Therapeutic Interventions: 1 on 1 counseling sessions, Psychoeducation, Medication administration, Evaluate responses to treatment, Monitor vital signs and CBGs as ordered, Perform/monitor CIWA, COWS, AIMS and Fall Risk screenings as ordered, Perform wound care treatments as ordered.  Evaluation of Outcomes: Progressing   LCSW Treatment Plan for Primary Diagnosis: <principal problem not  specified> Long Term Goal(s): Safe transition to appropriate next level of care at discharge, Engage patient in therapeutic group addressing interpersonal concerns.  Short Term Goals: Engage patient in aftercare planning with referrals and resources and Increase skills for wellness and recovery  Therapeutic Interventions: Assess for all discharge needs, 1 to 1 time with Social worker, Explore available resources and support systems, Assess for adequacy in community support network, Educate family and significant other(s) on suicide prevention, Complete Psychosocial Assessment, Interpersonal group therapy.  Evaluation of Outcomes: Progressing   Progress in Treatment: Attending groups: No. Participating in groups: No. Taking medication as prescribed: Yes. Toleration medication: Yes. Family/Significant other contact made: No, will contact:  will contact pt's mother Patient understands diagnosis: No. Discussing patient identified problems/goals with staff: Yes. Medical problems stabilized or resolved: Yes. Denies suicidal/homicidal ideation: Yes. Issues/concerns per patient self-inventory: No. Other:   New problem(s) identified: No, Describe:  None  New Short Term/Long Term Goal(s): Medication stabilization, elimination of SI thoughts, and development of a comprehensive mental wellness plan.   Patient Goals:  "Going home"  Discharge Plan or Barriers: Patient will be discharging and continuing to go to [facility]  Reason for Continuation of Hospitalization: Depression Hallucinations Medication stabilization  Estimated Length of Stay: 2-3 days   Attendees: Patient: Tracy Poole  08/26/2019   Physician: Dr. Malvin JohnsBrian Farah, MD 08/26/2019   Nursing: Marton Redwoodoni, RN 08/26/2019  RN Care Manager: 08/26/2019   Social Worker: Stephannie PetersJasmine Saket Hellstrom, LCSW 08/26/2019   Recreational Therapist:  08/26/2019  MSW Intern: Earlyne Iba  08/26/2019   Other:  08/26/2019   Other: 08/26/2019     Scribe for Treatment  Team: Delphia Grates, LCSW 08/26/2019 10:32 AM

## 2019-08-27 DIAGNOSIS — F062 Psychotic disorder with delusions due to known physiological condition: Secondary | ICD-10-CM

## 2019-08-27 LAB — URINALYSIS, COMPLETE (UACMP) WITH MICROSCOPIC
Bilirubin Urine: NEGATIVE
Glucose, UA: NEGATIVE mg/dL
Hgb urine dipstick: NEGATIVE
Ketones, ur: NEGATIVE mg/dL
Leukocytes,Ua: NEGATIVE
Nitrite: NEGATIVE
Protein, ur: NEGATIVE mg/dL
RBC / HPF: NONE SEEN RBC/hpf (ref 0–5)
Specific Gravity, Urine: 1.025 (ref 1.005–1.030)
Squamous Epithelial / HPF: NONE SEEN (ref 0–5)
WBC, UA: NONE SEEN WBC/hpf (ref 0–5)
pH: 6.5 (ref 5.0–8.0)

## 2019-08-27 MED ORDER — SULFAMETHOXAZOLE-TRIMETHOPRIM 800-160 MG PO TABS
1.0000 | ORAL_TABLET | Freq: Two times a day (BID) | ORAL | Status: DC
  Administered 2019-08-27 – 2019-08-28 (×3): 1 via ORAL
  Filled 2019-08-27 (×5): qty 1

## 2019-08-27 MED ORDER — ARIPIPRAZOLE 2 MG PO TABS
2.0000 mg | ORAL_TABLET | Freq: Once | ORAL | Status: DC
  Filled 2019-08-27: qty 1

## 2019-08-27 MED ORDER — RISPERIDONE 2 MG PO TABS
2.0000 mg | ORAL_TABLET | Freq: Two times a day (BID) | ORAL | Status: DC
  Administered 2019-08-27 – 2019-08-28 (×2): 2 mg via ORAL
  Filled 2019-08-27 (×5): qty 1

## 2019-08-27 NOTE — Progress Notes (Signed)
[facility]  08/27/2019 8:58 AM Tomasina Vella RedheadLatoni Agustin  MRN:  [id number] Subjective:   As discussed patient's antipsychotic was held due to low-grade fever but her CK is checked out normally she has no stiffness or rigidity or temperature is still 99.2.  We think she has a UTI based on her complaints of dysuria and slight pain so we will treat that empirically and restart antipsychotics as far as her psychiatric symptoms she states she should not of gone off her medication and does tend to minimize the previous expressed delusional material No EPS or TD Principal Problem: Exacerbation of underlying psychotic disorder Diagnosis: Active Problems:   Schizophrenia (HCC)  Total Time spent with patient: 20 minutes  Past Psychiatric History: see eval  Past Medical History:  Past Medical History:  Diagnosis Date  . Anxiety   . Asthma   . Depression     Past Surgical History:  Procedure Laterality Date  . CESAREAN SECTION    . EUSTACHIAN TUBE DILATION    . HERNIA REPAIR     Family History:  Family History  Problem Relation Age of Onset  . Bladder Cancer Neg Hx   . Kidney cancer Neg Hx    Family Psychiatric  History: see eval Social History:  Social History   Substance and Sexual Activity  Alcohol Use Yes   Comment: occasionallly     Social History   Substance and Sexual Activity  Drug Use No    Social History   Socioeconomic History  . Marital status: Single    Spouse name: Not on file  . Number of children: Not on file  . Years of education: Not on file  . Highest education level: Not on file  Occupational History  . Not on file  Social Needs  . Financial resource strain: Not on file  . Food insecurity    Worry: Not on file    Inability: Not on file  . Transportation needs    Medical: Not on file    Non-medical: Not on file  Tobacco Use  . Smoking status: Current Every Day Smoker    Packs/day: 1.00    Types: Cigarettes  . Smokeless tobacco: Never Used   Substance and Sexual Activity  . Alcohol use: Yes    Comment: occasionallly  . Drug use: No  . Sexual activity: Yes    Birth control/protection: None  Lifestyle  . Physical activity    Days per week: Not on file    Minutes per session: Not on file  . Stress: Not on file  Relationships  . Social Musicianconnections    Talks on phone: Not on file    Gets together: Not on file    Attends religious service: Not on file    Active member of club or organization: Not on file    Attends meetings of clubs or organizations: Not on file    Relationship status: Not on file  Other Topics Concern  . Not on file  Social History Narrative  . Not on file   Additional Social History:                         Sleep: Good  Appetite:  Good  Current Medications: Current Facility-Administered Medications  Medication Dose Route Frequency Provider Last Rate Last Dose  . acetaminophen (TYLENOL) tablet 650 mg  650 mg Oral Q6H PRN Patrcia Dollyate, Tina L, FNP   650 mg at 08/27/19 0600  .  alum & mag hydroxide-simeth (MAALOX/MYLANTA) 200-200-20 MG/5ML suspension 30 mL  30 mL Oral Q4H PRN Emmaline Kluver, FNP      . benztropine (COGENTIN) tablet 0.5 mg  0.5 mg Oral BID Johnn Hai, MD   0.5 mg at 08/27/19 0810  . gabapentin (NEURONTIN) capsule 300 mg  300 mg Oral TID Johnn Hai, MD   300 mg at 08/27/19 0810  . LORazepam (ATIVAN) tablet 2 mg  2 mg Oral Q4H PRN Johnn Hai, MD   2 mg at 08/26/19 2114   Or  . LORazepam (ATIVAN) injection 2 mg  2 mg Intramuscular Q4H PRN Johnn Hai, MD      . ziprasidone (GEODON) injection 20 mg  20 mg Intramuscular Q12H PRN Emmaline Kluver, FNP       And  . LORazepam (ATIVAN) tablet 1 mg  1 mg Oral PRN Emmaline Kluver, FNP      . magnesium hydroxide (MILK OF MAGNESIA) suspension 30 mL  30 mL Oral Daily PRN Emmaline Kluver, FNP      . omega-3 acid ethyl esters (LOVAZA) capsule 1 g  1 g Oral BID Johnn Hai, MD   1 g at 08/27/19 0810  . potassium chloride (KLOR-CON) CR tablet 10 mEq   10 mEq Oral BID Cobos, Myer Peer, MD   10 mEq at 08/27/19 0810    Lab Results:  Results for orders placed or performed during the hospital encounter of 08/25/19 (from the past 48 hour(s))  Glucose, capillary     Status: Abnormal   Collection Time: 08/26/19  5:41 PM  Result Value Ref Range   Glucose-Capillary 107 (H) 70 - 99 mg/dL   Comment 1 Notify RN    Comment 2 Document in Chart   CBC with Differential/Platelet     Status: Abnormal   Collection Time: 08/26/19  6:17 PM  Result Value Ref Range   WBC 11.0 (H) 4.0 - 10.5 K/uL    Comment: WHITE COUNT CONFIRMED ON SMEAR   RBC 4.75 3.87 - 5.11 MIL/uL   Hemoglobin 13.6 12.0 - 15.0 g/dL   HCT 42.8 36.0 - 46.0 %   MCV 90.1 80.0 - 100.0 fL   MCH 28.6 26.0 - 34.0 pg   MCHC 31.8 30.0 - 36.0 g/dL   RDW 13.4 11.5 - 15.5 %   Platelets 183 150 - 400 K/uL   nRBC 0.0 0.0 - 0.2 %   Neutrophils Relative % 72 %   Neutro Abs 8.0 (H) 1.7 - 7.7 K/uL   Lymphocytes Relative 20 %   Lymphs Abs 2.2 0.7 - 4.0 K/uL   Monocytes Relative 5 %   Monocytes Absolute 0.5 0.1 - 1.0 K/uL   Eosinophils Relative 1 %   Eosinophils Absolute 0.1 0.0 - 0.5 K/uL   Basophils Relative 1 %   Basophils Absolute 0.1 0.0 - 0.1 K/uL   RBC Morphology MORPHOLOGY UNREMARKABLE    Immature Granulocytes 1 %   Abs Immature Granulocytes 0.09 (H) 0.00 - 0.07 K/uL    Comment: Performed at [facility], [place] [name], [place], [place] [address]  Basic metabolic panel     Status: Abnormal   Collection Time: 08/26/19  6:17 PM  Result Value Ref Range   Sodium 138 135 - 145 mmol/L   Potassium 3.7 3.5 - 5.1 mmol/L   Chloride 105 98 - 111 mmol/L   CO2 23 22 - 32 mmol/L   Glucose, Bld 112 (H) 70 - 99 mg/dL  BUN 6 6 - 20 mg/dL   Creatinine, Ser 0.98 0.44 - 1.00 mg/dL   Calcium 9.5 8.9 - 11.9 mg/dL   GFR calc non Af Amer >60 >60 mL/min   GFR calc Af Amer >60 >60 mL/min   Anion gap 10 5 - 15    Comment: Performed at [facility], [address] [place], [place] [address]  CK     Status: None   Collection Time: 08/26/19  6:17 PM  Result Value Ref Range   Total CK 148 38 - 234 U/L    Comment: Performed at [facility], [address], [place], [place] [address]    Blood Alcohol level:  Lab Results  Component Value Date   ETH <10 08/25/2019   ETH <10 03/24/2018    Metabolic Disorder Labs: Lab Results  Component Value Date   HGBA1C 4.9 03/27/2018   MPG 93.93 03/27/2018   MPG 100 03/28/2017   Lab Results  Component Value Date   PROLACTIN 53.4 (H) 03/28/2017   Lab Results  Component Value Date   CHOL 132 03/27/2018   TRIG 58 03/27/2018   HDL 29 (L) 03/27/2018   CHOLHDL 4.6 03/27/2018   VLDL 12 03/27/2018   LDLCALC 91 03/27/2018   LDLCALC 77 03/28/2017    Physical Findings: AIMS: Facial and Oral Movements Muscles of Facial Expression: None, normal Lips and Perioral Area: None, normal Jaw: None, normal Tongue: None, normal,Extremity Movements Upper (arms, wrists, hands, fingers): None, normal Lower (legs, knees, ankles, toes): None, normal, Trunk Movements Neck, shoulders, hips: None, normal, Overall Severity Severity of abnormal movements (highest score from questions above): None, normal Incapacitation due to abnormal movements: None, normal Patient's awareness of abnormal movements (rate only patient's report): No Awareness, Dental Status Current problems with teeth and/or dentures?: No Does patient usually wear dentures?: No  CIWA:    COWS:     Musculoskeletal: Strength & Muscle Tone: within normal limits Gait & Station: normal Patient leans: N/A  Psychiatric Specialty Exam: Physical Exam  ROS  Blood pressure 99/66, pulse (!) 103, temperature 99.2 F (37.3 C), temperature source Oral, resp. rate 16, height 5\' 4"  (1.626 m), weight 73.5 kg, SpO2 100 %.Body mass index is 27.81 kg/m.  General Appearance: Casual  Eye Contact:  Good  Speech:  Clear and Coherent  Volume:  Decreased   Mood:  Dysphoric  Affect:  Blunt  Thought Process:  Goal Directed and Linear  Orientation:  Full (Time, Place, and Person)  Thought Content:  Rumination/still delusional  Suicidal Thoughts:  No  Homicidal Thoughts:  No  Memory:  Immediate;   Fair Recent;   Fair Remote;   Good  Judgement:  Intact  Insight:  Fair  Psychomotor Activity:  Normal  Concentration:  Concentration: Good and Attention Span: Fair  Recall:  of Knowledge:  Fair  Language:  Fair  Akathisia:  Negative  Handed:  Right  AIMS (if indicated):     Assets:  Physical Health Resilience Social Support  ADL's:  Intact  Cognition:  WNL  Sleep:  Number of Hours: 5     Treatment Plan Summary: Daily contact with patient to assess and evaluate symptoms and progress in treatment and Medication management  Continue current precautions treat UTI empirically begin antipsychotics again monitor for fever no change in precautions on 15-minute checks continue reality based therapy  Shravya Wickwire, MD 08/27/2019, 8:58 AM

## 2019-08-27 NOTE — Progress Notes (Signed)
D: Pt continues to express needing to go to the hospital. Pt appears to be in no acute distress at this time. Pt was dialing 911 earlier in the day.  A: Pt was offered support and encouragement.  Pt was encourage to attend groups. Q 15 minute checks were done for safety.  R: safety maintained on unit.

## 2019-08-27 NOTE — Progress Notes (Signed)
Pt refused her medications, but pt did take herantibiotic

## 2019-08-27 NOTE — [facility] Suicide Risk Assessment (Signed)
[place] INPATIENT:  Family/Significant Other Suicide Prevention Education  Suicide Prevention Education:  Education Completed; Pt's mother, Tracy Poole, has been identified by the patient as the family member/significant other with whom the patient will be residing, and identified as the person(s) who will aid the patient in the event of a mental health crisis (suicidal ideations/suicide attempt).  With written consent from the patient, the family member/significant other has been provided the following suicide prevention education, prior to the and/or following the discharge of the patient.  The suicide prevention education provided includes the following:  Suicide risk factors  Suicide prevention and interventions  National Suicide Hotline telephone number  [facility] assessment telephone number  [facility] Emergency Assistance [place] and/or Residential Mobile Crisis Unit telephone number  Request made of family/significant other to:  Remove weapons (e.g., guns, rifles, knives), all items previously/currently identified as safety concern.    Remove drugs/medications (over-the-counter, prescriptions, illicit drugs), all items previously/currently identified as a safety concern.  The family member/significant other verbalizes understanding of the suicide prevention education information provided.  The family member/significant other agrees to remove the items of safety concern listed above.  CSW contacted pt's mother, Tracy Poole. Pt's mother stated that she is hoping that the patient could obtain an injection. Pt's mother asks to speak with the doctor. She feels that the patient will not follow up with her medications and an injection will be better. Pt's mother asked about the pt's low-grade fever and UTI. She mentioned thinking that the patient was pregnant. Pt's mother reports that she does not have any other concerns and she is okay with the patient  discharging tomorrow.   [name] 08/27/2019, 10:09 AM

## 2019-08-27 NOTE — [facility] Group Notes (Signed)
[place] LCSW Group Therapy Note   Date and Time: 08/27/2019 @ 1:30pm  Type of Group and Topic: Psychoeducational Group: Discharge Planning  Participation Level: [facility] PARTICIPATION LEVEL: Active  Mood: Pleasant   Description of Group: Discharge planning group reviews patient's anticipated discharge plans and assists patients to anticipate and address any barriers to wellness/recovery in the community. Suicide prevention education is reviewed with patients in group. Therapeutic Goals 1. Patients will state their anticipated discharge plan and mental health aftercare 2. Patients will identify potential barriers to wellness in the community setting 3. Patients will engage in problem solving, solution focused discussion of ways to anticipate and address barriers to wellness/recovery     Summary of Patient Progress:  Patient was active and engaged throughout group therapy today. Patient was able to identify how she would assist herself in different scenarios that could possibly result in the patient wanting to come back to the hospital and instead figure out how to cope with the scenarios. Patient discussed how her family often is a barrier to her wellness in the community and how she will problem solve a way to deal with that. Such as: avoid and walk away. Patient also discussed aftercare barriers to her wellness in the community and how she will problem solve a way to deal with those barriers, such as, rescheduling appointments if she misses them.    Plan for Discharge/Comments:    Transportation Means: Pt's mother     Supports: Pt's mother      Therapeutic Modalities: Motivational Interviewing

## 2019-08-27 NOTE — Progress Notes (Signed)
Pt continues to present paranoid on the unit. Pt will sleep in the dayroom , and pt has been seen sleeping in the chair by the phone. Pt expressed her desire not to go into her room.

## 2019-08-27 NOTE — Progress Notes (Signed)
DAR NOTE: Patient presents with anxious affect and depressed mood.  Denies suicidal thoughts, auditory and visual hallucinations.  Rates depression at 0, hopelessness at 0, and anxiety at 0.  Maintained on routine safety checks.  Medications given as prescribed.  Support and encouragement offered as needed.  Attended group and participated.  States goal for today is "going home."  Patient observed socializing with peers in the dayroom.  Patient is safe on and off the unit.  Offered no complaint.

## 2019-08-27 NOTE — Progress Notes (Signed)
Recreation Therapy Notes  Date: 10.1.20 Time: 9826-4158 Location: [place]   Group Topic: Leisure Education  Goal Area(s) Addresses:  Patient will identify positive leisure activities.  Patient will identify one positive benefit of participation in leisure activities.   Behavioral Response: Minimal  Intervention: Leisure Group Game  Activity: Pictionary.  LRT and patients played a game of Pictionary.  One person would pick a word from the container and draw what the word represents on the board.  The remaining participants get one minute to guess what the picture is.  The person who guesses correctly gets the next turn.  Education:  Leisure Education, Dentist  Education Outcome: Acknowledges education/In group clarification offered/Needs additional education  Clinical Observations/Feedback: Pt was attentive and observed at first.  Pt participated when engaged.  Once pt took one turn, pt became more interested and participated on her own.  Pt was bright and smiling during activity.   Victorino Sparrow, LRT/CTRS     Victorino Sparrow A 08/27/2019 10:49 AM

## 2019-08-27 NOTE — Progress Notes (Signed)
Pt up complaining of being dizzy and wanting to go to the hospital. Pt Bp: 124/80 sitting Temp 99.2 , 99/66 standing. "I don't want to be here, I don't feel safe, someone is following me"

## 2019-08-28 LAB — URINE CULTURE
Culture: NO GROWTH
Special Requests: NORMAL

## 2019-08-28 MED ORDER — ARIPIPRAZOLE ER 400 MG IM SRER
400.0000 mg | INTRAMUSCULAR | Status: DC
  Filled 2019-08-28: qty 2

## 2019-08-28 MED ORDER — ARIPIPRAZOLE ER 400 MG IM SRER: 400.0000 mg | INTRAMUSCULAR | 11 refills | Status: DC

## 2019-08-28 MED ORDER — BENZTROPINE MESYLATE 0.5 MG PO TABS: 0.5000 mg | ORAL_TABLET | Freq: Two times a day (BID) | ORAL | 2 refills | Status: DC

## 2019-08-28 MED ORDER — GABAPENTIN 300 MG PO CAPS: 300.0000 mg | ORAL_CAPSULE | Freq: Three times a day (TID) | ORAL | 2 refills | Status: DC

## 2019-08-28 MED ORDER — SULFAMETHOXAZOLE-TRIMETHOPRIM 800-160 MG PO TABS: 1.0000 | ORAL_TABLET | Freq: Two times a day (BID) | ORAL | 0 refills | Status: DC

## 2019-08-28 MED ORDER — RISPERIDONE 2 MG PO TABS: 4.0000 mg | ORAL_TABLET | Freq: Two times a day (BID) | ORAL | 2 refills | Status: DC

## 2019-08-28 MED ORDER — OMEGA-3-ACID ETHYL ESTERS 1 G PO CAPS: 1.0000 g | ORAL_CAPSULE | Freq: Two times a day (BID) | ORAL | 11 refills | Status: DC

## 2019-08-28 NOTE — [facility] Suicide Risk Assessment (Signed)
[facility] Discharge Suicide Risk Assessment   Principal Problem: Exacerbation of psychotic disorder/noncompliance  discharge Diagnoses: Active Problems:   Schizophrenia (Advance)   Total Time spent with patient: 45 minutes  Musculoskeletal: Strength & Muscle Tone: within normal limits Gait & Station: normal Patient leans: N/A  Psychiatric Specialty Exam: Physical Exam  ROS  Blood pressure (!) 110/45, pulse 97, temperature 98.3 F (36.8 C), temperature source Oral, resp. rate 20, height 5\' 4"  (1.626 m), weight 73.5 kg, SpO2 100 %.Body mass index is 27.81 kg/m.  General Appearance: Casual  Eye Contact:  Minimal  Speech:  Clear and Coherent  Volume:  Decreased  Mood:  stable  Affect:  Blunt  Thought Process:  Coherent and Linear  Orientation:  Full (Time, Place, and Person)  Thought Content:  Logical and Tangential  Suicidal Thoughts:  No  Homicidal Thoughts:  No  Memory:  Immediate;   Fair Recent;   Fair Remote;   Fair  Judgement:  Fair  Insight:  Fair  Psychomotor Activity:  Normal  Concentration:  Concentration: Fair and Attention Span: Fair  Recall:  [employer] of Knowledge:  Fair  Language:  Fair  Akathisia:  Negative  Handed:  Right  AIMS (if indicated):     Assets:  Leisure Time Physical Health Resilience  ADL's:  Intact  Cognition:  WNL  Sleep:  Number of Hours: 1     Mental Status Per Nursing Assessment::   On Admission:  NA  Demographic Factors:  Unemployed  Loss Factors: NA  Historical Factors: NA  Risk Reduction Factors:   Sense of responsibility to family and Religious beliefs about death  Continued Clinical Symptoms:  Schizophrenia:   Less than 18 years old  Cognitive Features That Contribute To Risk:  None    Suicide Risk:  Minimal: No identifiable suicidal ideation.  Patients presenting with no risk factors but with morbid ruminations; may be classified as minimal risk based on the severity of the depressive symptoms  Follow-up  Information    [facility] Follow up on 09/04/2019.   Why: Hospital discharge appointment is Friday, 10/9 at 12:30p.  Please bring your photo ID, insurance card, and current medications.  Contact information: [place] [address] [phone]           Plan Of Care/Follow-up recommendations:  Activity:  full  Synda Bagent, MD 08/28/2019, 9:36 AM

## 2019-08-28 NOTE — Progress Notes (Signed)
Pt continues to refuse medication , pt paranoid. Pt sitting in the doorway of her room , falling asleep at times. Pt ask staff if she could sit in the hallway with them. Pt informed that she has a bed that she can sleep in.

## 2019-08-28 NOTE — Progress Notes (Signed)
Pt has endorsed being dizzy during the evening . Pt seen standing and walking on the unit during the evening and not appearing to be in any distress  .

## 2019-08-28 NOTE — Progress Notes (Signed)
Recreation Therapy Notes  Date: 10.2.20 Time: 1000 Location: 400 Hall Dayroom   Group Topic: Communication, Team Building, Problem Solving  Goal Area(s) Addresses:  Patient will effectively work with peer towards shared goal.  Patient will identify skill used to make activity successful.  Patient will identify how skills used during activity can be used to reach post d/c goals.   Behavioral Response:  Engaged  Intervention: STEM Activity   Activity: [employer]. Patients were provided the following materials: 5 drinking straws, 5 rubber bands, 5 paper clips, 2 index cards, and 2 drinking cups.  Using the provided materials patients were asked to build a launching mechanisms to launch a ping pong ball approximately 12 feet. Patients were divided into teams of 3-5.   Education: Education officer, community, Dentist.   Education Outcome: Acknowledges education/In group clarification offered/Needs additional education.   Clinical Observations/Feedback: Pt worked well with her peers in coming up with a plan and executing it.  Pt was bright and engaged throughout activity.     Victorino Sparrow, LRT/CTRS    Victorino Sparrow A 08/28/2019 10:57 AM

## 2019-08-28 NOTE — Progress Notes (Signed)
  [facility] Adult Case Management Discharge Plan :  Will you be returning to the same living situation after discharge:  Yes,  going home At discharge, do you have transportation home?: Yes,  will get a lyft Do you have the ability to pay for your medications: Yes,  medicaid   Release of information consent forms completed and in the chart;  Patient's signature needed at discharge.  Patient to Follow up at: Follow-up Information    [facility] Follow up on 09/04/2019.   Why: Hospital discharge appointment is Friday, 10/9 at 12:30p.  Please bring your photo ID, insurance card, and current medications.  Contact information: [place] [address] [phone]           Next level of care provider has access to [facility] and Suicide Prevention discussed: Yes,  with pt's mother      Has patient been referred to the Quitline?: Patient refused referral  Patient has been referred for addiction treatment: Yes  Billey Chang, Student-Social Work 08/28/2019, 9:12 AM
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
## 2019-08-28 NOTE — Progress Notes (Signed)
Recreation Therapy Notes  INPATIENT RECREATION TR PLAN  Patient Details Name: Tracy Poole MRN: [id number] DOB: [birth year] Today's Date: 08/28/2019  Rec Therapy Plan Is patient appropriate for Therapeutic Recreation?: Yes Treatment times per week: about 3 days Estimated Length of Stay: 5-7 days TR Treatment/Interventions: Group participation (Comment)  Discharge Criteria Pt will be discharged from therapy if:: Discharged Treatment plan/goals/alternatives discussed and agreed upon by:: Patient/family  Discharge Summary Short term goals set: See patient care plan Short term goals met: Adequate for discharge Progress toward goals comments: Groups attended Which groups?: Leisure education, Other (Comment)(Team building) Reason goals not met: Pt being discharged Therapeutic equipment acquired: N/A Reason patient discharged from therapy: Discharge from hospital Pt/family agrees with progress & goals achieved: Yes Date patient discharged from therapy: 08/28/19    Victorino Sparrow, LRT/CTRS  Ria Comment, [place] 08/28/2019, 11:02 AM

## 2019-09-03 ENCOUNTER — Encounter: Payer: Self-pay | Admitting: Emergency Medicine

## 2019-09-03 DIAGNOSIS — J45909 Unspecified asthma, uncomplicated: Secondary | ICD-10-CM | POA: Diagnosis not present

## 2019-09-03 DIAGNOSIS — F17210 Nicotine dependence, cigarettes, uncomplicated: Secondary | ICD-10-CM | POA: Diagnosis not present

## 2019-09-03 DIAGNOSIS — N938 Other specified abnormal uterine and vaginal bleeding: Secondary | ICD-10-CM | POA: Diagnosis not present

## 2019-09-03 DIAGNOSIS — Z3202 Encounter for pregnancy test, result negative: Secondary | ICD-10-CM | POA: Insufficient documentation

## 2019-09-03 LAB — URINALYSIS, COMPLETE (UACMP) WITH MICROSCOPIC
Bacteria, UA: NONE SEEN
RBC / HPF: >50 RBC/hpf — ABNORMAL HIGH (ref 0–5)
Specific Gravity, Urine: 1.027 (ref 1.005–1.030)
Squamous Epithelial / HPF: NONE SEEN (ref 0–5)
WBC, UA: NONE SEEN WBC/hpf (ref 0–5)

## 2019-09-03 LAB — CBC
HCT: 40.2 % (ref 36.0–46.0)
Hemoglobin: 13.4 g/dL (ref 12.0–15.0)
MCH: 28.7 pg (ref 26.0–34.0)
MCHC: 33.3 g/dL (ref 30.0–36.0)
MCV: 86.1 fL (ref 80.0–100.0)
Platelets: 207 10*3/uL (ref 150–400)
RBC: 4.67 MIL/uL (ref 3.87–5.11)
RDW: 13.6 % (ref 11.5–15.5)
WBC: 11.1 10*3/uL — ABNORMAL HIGH (ref 4.0–10.5)
nRBC: 0 % (ref 0.0–0.2)

## 2019-09-03 LAB — BASIC METABOLIC PANEL
Anion gap: 10 (ref 5–15)
BUN: 7 mg/dL (ref 6–20)
CO2: 24 mmol/L (ref 22–32)
Calcium: 9.1 mg/dL (ref 8.9–10.3)
Chloride: 104 mmol/L (ref 98–111)
Creatinine, Ser: 0.77 mg/dL (ref 0.44–1.00)
GFR calc Af Amer: >60 mL/min (ref >60)
GFR calc non Af Amer: >60 mL/min (ref >60)
Glucose, Bld: 111 mg/dL — ABNORMAL HIGH (ref 70–99)
Potassium: 3 mmol/L — ABNORMAL LOW (ref 3.5–5.1)
Sodium: 138 mmol/L (ref 135–145)

## 2019-09-03 LAB — HCG, QUANTITATIVE, PREGNANCY: hCG, Beta Chain, Quant, S: <1 m[IU]/mL (ref <5)

## 2019-09-03 NOTE — ED Triage Notes (Addendum)
Pt arrived via EMS from home with c/o vaginal bleeding and cramps. Per pt, she has not had regular period x3 months. Pt not on birth control and reports last sexual encounter was also 3 months ago. Pt was seen and had a POC urine preg on 9/29 with negative result. Pt is non compliant with psych meds. Pt denies SI and HI.

## 2019-09-04 ENCOUNTER — Emergency Department
Admission: EM | Admit: 2019-09-04 | Discharge: 2019-09-04 | Disposition: A | Discharge status: Home / Self Care | Payer: Medicaid Other | Attending: Emergency Medicine | Admitting: Emergency Medicine

## 2019-09-04 DIAGNOSIS — N939 Abnormal uterine and vaginal bleeding, unspecified: Secondary | ICD-10-CM

## 2019-09-04 NOTE — ED Provider Notes (Signed)
[facility] Emergency Department Provider Note ___   First MD Initiated Contact with Patient 09/04/19 [phone]     (approximate)  I have reviewed the triage vital signs and the nursing notes.   HISTORY  Chief Complaint Vaginal Bleeding    HPI Tracy Poole is a 23 y.o. female with below list of previous medical conditions presents to the emergency department secondary to "I wanted to see if I was pregnant".  Patient admits to acute onset of vaginal bleeding today.  Patient states last menstrual period was 3 months ago.       Past Medical History:  Diagnosis Date  . Anxiety   . Asthma   . Depression     Patient Active Problem List   Diagnosis Date Noted  . Severe recurrent major depression with psychotic features (HCC) 03/26/2018  . Noncompliance 03/25/2018  . Pregnancy 09/29/2017  . Pregnancy affected by fetal growth restriction 09/05/2017  . Abnormal findings on antenatal screening   . Major depressive disorder, single episode, severe with psychotic features (HCC) 03/28/2017  . First trimester pregnancy 03/27/2017  . Schizophrenia (HCC) 03/27/2017    Past Surgical History:  Procedure Laterality Date  . CESAREAN SECTION    . EUSTACHIAN TUBE DILATION    . HERNIA REPAIR      Prior to Admission medications   Medication Sig Start Date End Date Taking? Authorizing Provider  ARIPiprazole ER (ABILIFY MAINTENA) 400 MG SRER injection Inject 2 mLs (400 mg total) into the muscle every 28 (twenty-eight) days. Next due 11/2 08/28/19   Malvin JohnsFarah, Brian, MD  benztropine (COGENTIN) 0.5 MG tablet Take 1 tablet (0.5 mg total) by mouth 2 (two) times daily. 08/28/19   Malvin JohnsFarah, Brian, MD  gabapentin (NEURONTIN) 300 MG capsule Take 1 capsule (300 mg total) by mouth 3 (three) times daily. 08/28/19   Malvin JohnsFarah, Brian, MD  meloxicam (MOBIC) 15 MG tablet Take 1 tablet (15 mg total) by mouth daily. 07/15/18   Cuthriell, Delorise RoyalsJonathan D, PA-C  omega-3 acid ethyl esters (LOVAZA) 1 g  capsule Take 1 capsule (1 g total) by mouth 2 (two) times daily. 08/28/19   Malvin JohnsFarah, Brian, MD  risperiDONE (RISPERDAL) 2 MG tablet Take 2 tablets (4 mg total) by mouth 2 (two) times daily. 08/28/19 11/26/19  Malvin JohnsFarah, Brian, MD  sulfamethoxazole-trimethoprim (BACTRIM DS) 800-160 MG tablet Take 1 tablet by mouth every 12 (twelve) hours. 08/28/19   Malvin JohnsFarah, Brian, MD    Allergies Patient has no known allergies.  Family History  Problem Relation Age of Onset  . Bladder Cancer Neg Hx   . Kidney cancer Neg Hx     Social History Social History   Tobacco Use  . Smoking status: Current Every Day Smoker    Packs/day: 1.00    Types: Cigarettes  . Smokeless tobacco: Never Used  Substance Use Topics  . Alcohol use: Yes    Comment: occasionallly  . Drug use: No    Review of Systems Constitutional: No fever/chills Eyes: No visual changes. ENT: No sore throat. Cardiovascular: Denies chest pain. Respiratory: Denies shortness of breath. Gastrointestinal: No abdominal pain.  No nausea, no vomiting.  No diarrhea.  No constipation. Genitourinary: Negative for dysuria.  Positive for vaginal bleeding Musculoskeletal: Negative for neck pain.  Negative for back pain. Integumentary: Negative for rash. Neurological: Negative for headaches, focal weakness or numbness.    ____________________________________________   PHYSICAL EXAM:  VITAL SIGNS: ED Triage Vitals [09/03/19 2212]  Enc Vitals Group     BP  122/79     Pulse Rate 95     Resp 18     Temp 99 F (37.2 C)     Temp Source Oral     SpO2 99 %     Weight      Height      Head Circumference      Peak Flow      Pain Score      Pain Loc      Pain Edu?      Excl. in GC?    Constitutional: Alert and oriented.  Eyes: Conjunctivae are normal.  Mouth/Throat: Mucous membranes are moist. Neck: No stridor.  No meningeal signs.   Cardiovascular: Normal rate, regular rhythm. Good peripheral circulation. Grossly normal heart sounds.  Respiratory: Normal respiratory effort.  No retractions. Gastrointestinal: Soft and nontender. No distention.  Musculoskeletal: No lower extremity tenderness nor edema. No gross deformities of extremities. Neurologic:  Normal speech and language. No gross focal neurologic deficits are appreciated.  Skin:  Skin is warm, dry and intact. Psychiatric: Mood and affect are normal. Speech and behavior are normal.  ____________________________________________   LABS (all labs ordered are listed, but only abnormal results are displayed)  Labs Reviewed  CBC - Abnormal; Notable for the following components:      Result Value   WBC 11.1 (*)    All other components within normal limits  BASIC METABOLIC PANEL - Abnormal; Notable for the following components:   Potassium 3.0 (*)    Glucose, Bld 111 (*)    All other components within normal limits  URINALYSIS, COMPLETE (UACMP) WITH MICROSCOPIC - Abnormal; Notable for the following components:   Color, Urine RED (*)    APPearance TURBID (*)    Glucose, UA   (*)    Value: TEST NOT REPORTED DUE TO COLOR INTERFERENCE OF URINE PIGMENT   Hgb urine dipstick   (*)    Value: TEST NOT REPORTED DUE TO COLOR INTERFERENCE OF URINE PIGMENT   Bilirubin Urine   (*)    Value: TEST NOT REPORTED DUE TO COLOR INTERFERENCE OF URINE PIGMENT   Ketones, ur   (*)    Value: TEST NOT REPORTED DUE TO COLOR INTERFERENCE OF URINE PIGMENT   Protein, ur   (*)    Value: TEST NOT REPORTED DUE TO COLOR INTERFERENCE OF URINE PIGMENT   Nitrite   (*)    Value: TEST NOT REPORTED DUE TO COLOR INTERFERENCE OF URINE PIGMENT   Leukocytes,Ua   (*)    Value: TEST NOT REPORTED DUE TO COLOR INTERFERENCE OF URINE PIGMENT   RBC / HPF >50 (*)    All other components within normal limits  HCG, QUANTITATIVE, PREGNANCY     Procedures   ____________________________________________   INITIAL IMPRESSION / MDM / ASSESSMENT AND PLAN / ED COURSE  As part of my medical decision making, I  reviewed the following data within the electronic MEDICAL RECORD NUMBER   23 year old female presenting with above-stated history and physical exam secondary to concerns for pregnancy.  Patient's hCG quantitative less than 1.  Patient has been seen in emergency department multiple times secondary to the same concern.  Patient referred to gynecology for further outpatient evaluation. ____________________________________________  FINAL CLINICAL IMPRESSION(S) / ED DIAGNOSES  Final diagnoses:  Vaginal bleeding     MEDICATIONS GIVEN DURING THIS VISIT:  Medications - No data to display   ED Discharge Orders    None      *Please note:  Tracy Latoni  Poole was evaluated in Emergency Department on 09/04/2019 for the symptoms described in the history of present illness. She was evaluated in the context of the global COVID-19 pandemic, which necessitated consideration that the patient might be at risk for infection with the SARS-CoV-2 virus that causes COVID-19. Institutional protocols and algorithms that pertain to the evaluation of patients at risk for COVID-19 are in a state of rapid change based on information released by regulatory bodies including the CDC and federal and state organizations. These policies and algorithms were followed during the patient's care in the ED.  Some ED evaluations and interventions may be delayed as a result of limited staffing during the pandemic.*  Note:  This document was prepared using Dragon voice recognition software and may include unintentional dictation errors.   Gregor Hams, MD 09/04/19 [phone]

## 2019-09-07 ENCOUNTER — Encounter: Payer: Self-pay | Admitting: Emergency Medicine

## 2019-09-07 ENCOUNTER — Other Ambulatory Visit: Payer: Self-pay

## 2019-09-07 ENCOUNTER — Emergency Department
Admission: EM | Admit: 2019-09-07 | Discharge: 2019-09-07 | Disposition: A | Discharge status: Home / Self Care | Payer: Medicaid Other | Attending: Emergency Medicine | Admitting: Emergency Medicine

## 2019-09-07 DIAGNOSIS — J45909 Unspecified asthma, uncomplicated: Secondary | ICD-10-CM | POA: Diagnosis not present

## 2019-09-07 DIAGNOSIS — Z79899 Other long term (current) drug therapy: Secondary | ICD-10-CM | POA: Diagnosis not present

## 2019-09-07 DIAGNOSIS — F209 Schizophrenia, unspecified: Secondary | ICD-10-CM | POA: Insufficient documentation

## 2019-09-07 DIAGNOSIS — F29 Unspecified psychosis not due to a substance or known physiological condition: Secondary | ICD-10-CM | POA: Diagnosis not present

## 2019-09-07 DIAGNOSIS — F17210 Nicotine dependence, cigarettes, uncomplicated: Secondary | ICD-10-CM | POA: Insufficient documentation

## 2019-09-07 DIAGNOSIS — Z008 Encounter for other general examination: Secondary | ICD-10-CM | POA: Insufficient documentation

## 2019-09-07 DIAGNOSIS — R440 Auditory hallucinations: Secondary | ICD-10-CM | POA: Diagnosis present

## 2019-09-07 HISTORY — DX: Schizophrenia, unspecified: F20.9

## 2019-09-07 LAB — CBC
HCT: 38.2 % (ref 36.0–46.0)
Hemoglobin: 12.6 g/dL (ref 12.0–15.0)
MCH: 28.7 pg (ref 26.0–34.0)
MCHC: 33 g/dL (ref 30.0–36.0)
MCV: 87 fL (ref 80.0–100.0)
Platelets: 216 10*3/uL (ref 150–400)
RBC: 4.39 MIL/uL (ref 3.87–5.11)
RDW: 13.3 % (ref 11.5–15.5)
WBC: 11.2 10*3/uL — ABNORMAL HIGH (ref 4.0–10.5)
nRBC: 0 % (ref 0.0–0.2)

## 2019-09-07 LAB — URINE DRUG SCREEN, QUALITATIVE (ARMC ONLY)
Amphetamines, Ur Screen: NOT DETECTED
Barbiturates, Ur Screen: NOT DETECTED
Benzodiazepine, Ur Scrn: NOT DETECTED
Cannabinoid 50 Ng, Ur ~~LOC~~: NOT DETECTED
Cocaine Metabolite,Ur ~~LOC~~: NOT DETECTED
MDMA (Ecstasy)Ur Screen: NOT DETECTED
Methadone Scn, Ur: NOT DETECTED
Opiate, Ur Screen: NOT DETECTED
Phencyclidine (PCP) Ur S: NOT DETECTED
Tricyclic, Ur Screen: NOT DETECTED

## 2019-09-07 LAB — COMPREHENSIVE METABOLIC PANEL
ALT: 54 U/L — ABNORMAL HIGH (ref 0–44)
AST: 27 U/L (ref 15–41)
Albumin: 4 g/dL (ref 3.5–5.0)
Alkaline Phosphatase: 98 U/L (ref 38–126)
Anion gap: 8 (ref 5–15)
BUN: 7 mg/dL (ref 6–20)
CO2: 25 mmol/L (ref 22–32)
Calcium: 8.8 mg/dL — ABNORMAL LOW (ref 8.9–10.3)
Chloride: 104 mmol/L (ref 98–111)
Creatinine, Ser: 0.66 mg/dL (ref 0.44–1.00)
GFR calc Af Amer: >60 mL/min (ref >60)
GFR calc non Af Amer: >60 mL/min (ref >60)
Glucose, Bld: 111 mg/dL — ABNORMAL HIGH (ref 70–99)
Potassium: 3.3 mmol/L — ABNORMAL LOW (ref 3.5–5.1)
Sodium: 137 mmol/L (ref 135–145)
Total Bilirubin: 0.6 mg/dL (ref 0.3–1.2)
Total Protein: 6.9 g/dL (ref 6.5–8.1)

## 2019-09-07 LAB — POCT PREGNANCY, URINE: Preg Test, Ur: NEGATIVE

## 2019-09-07 LAB — ETHANOL: Alcohol, Ethyl (B): <10 mg/dL (ref <10)

## 2019-09-07 NOTE — ED Notes (Signed)
Pt. Transferred from Triage to room 20 hall after dressing out and screening for contraband. Pt. Oriented to Quad including Q15 minute rounds as well as Engineer, drilling for their protection. Patient is alert and oriented, warm and dry in no acute distress. Patient denies SI, HI, and AVH. Patient states "I don't feel safe at home". Pt. Encouraged to let me know if needs arise.
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
## 2019-09-07 NOTE — ED Triage Notes (Signed)
Pt brought to ED voluntarily from home by [facility] officer Gilford Rile; pt with history of schizophrenia and is not taking medications as prescribed; pt is having auditory and visual hallucinations; pt calm and cooperative in triage

## 2019-09-07 NOTE — ED Provider Notes (Signed)
10:32 AM Patient has been seen and evaluated by psychiatry team and deemed appropriate for discharge. Patient determined not to be a threat to themselves or others. Patient is otherwise medically clear.   Will plan for discharge with outpatient follow up.     Lilia Pro., MD 09/07/19 1032

## 2019-09-07 NOTE — ED Notes (Signed)
Pt discharged home. VS stable. Pt denies SI/HI and AVH. All belongings returned to patient. Discharge instructions reviewed with patient. Pt signed for discharge. Pt had a ride coming to pick her up.

## 2019-09-07 NOTE — ED Notes (Signed)
Patient talking to TTS on camera in consult room.
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
## 2019-09-07 NOTE — ED Notes (Signed)
Patient refusing Covid swab

## 2019-09-07 NOTE — ED Notes (Signed)
Psychiatry has cleared pt for discharge.

## 2019-09-07 NOTE — ED Notes (Signed)
Hourly rounding reveals patient sleeping in hall bed. No complaints, stable, in no acute distress. Q15 minute rounds and monitoring via Rover and Officer to continue.  

## 2019-09-07 NOTE — ED Provider Notes (Signed)
[facility] Emergency Department Provider Note  ____________________________________________  Time seen: Approximately 4:59 AM  I have reviewed the triage vital signs and the nursing notes.   HISTORY  Chief Complaint Hallucinations   HPI Tracy Poole is a 23 y.o. female with a history of schizophrenia, depression, anxiety who presents for hallucinations.  Patient initially  arrived saying that she was having auditory and visual hallucinations.  After police left patient tells me that she was not really having any hallucinations she just lied to get out of the house.  She reports that her mother had a female friend at the house today that made her feel very uncomfortable.  She denies any physical, verbal, or sexual abuse by this female friend.  She reports that his presence in the house just made her feel uneasy and she wanted to get out of the house.  She denies SI or HI.  She denies hallucinations.  She reports not being on any medications since being discharged from the hospital 10 days ago.  Past Medical History:  Diagnosis Date  . Anxiety   . Asthma   . Depression   . Schizophrenia [facility](HCC)     Patient Active Problem List   Diagnosis Date Noted  . Severe recurrent major depression with psychotic features (HCC) 03/26/2018  . Noncompliance 03/25/2018  . Pregnancy 09/29/2017  . Pregnancy affected by fetal growth restriction 09/05/2017  . Abnormal findings on antenatal screening   . Major depressive disorder, single episode, severe with psychotic features (HCC) 03/28/2017  . First trimester pregnancy 03/27/2017  . Schizophrenia (HCC) 03/27/2017    Past Surgical History:  Procedure Laterality Date  . CESAREAN SECTION    . EUSTACHIAN TUBE DILATION    . HERNIA REPAIR      Prior to Admission medications   Medication Sig Start Date End Date Taking? Authorizing Provider  ARIPiprazole ER (ABILIFY MAINTENA) 400 MG SRER injection Inject 2 mLs (400 mg  total) into the muscle every 28 (twenty-eight) days. Next due 11/2 08/28/19   Malvin JohnsFarah, Brian, MD  benztropine (COGENTIN) 0.5 MG tablet Take 1 tablet (0.5 mg total) by mouth 2 (two) times daily. 08/28/19   Malvin JohnsFarah, Brian, MD  gabapentin (NEURONTIN) 300 MG capsule Take 1 capsule (300 mg total) by mouth 3 (three) times daily. 08/28/19   Malvin JohnsFarah, Brian, MD  meloxicam (MOBIC) 15 MG tablet Take 1 tablet (15 mg total) by mouth daily. 07/15/18   Cuthriell, Delorise RoyalsJonathan D, PA-C  omega-3 acid ethyl esters (LOVAZA) 1 g capsule Take 1 capsule (1 g total) by mouth 2 (two) times daily. 08/28/19   Malvin JohnsFarah, Brian, MD  risperiDONE (RISPERDAL) 2 MG tablet Take 2 tablets (4 mg total) by mouth 2 (two) times daily. 08/28/19 11/26/19  Malvin JohnsFarah, Brian, MD  sulfamethoxazole-trimethoprim (BACTRIM DS) 800-160 MG tablet Take 1 tablet by mouth every 12 (twelve) hours. 08/28/19   Malvin JohnsFarah, Brian, MD    Allergies Patient has no known allergies.  Family History  Problem Relation Age of Onset  . Bladder Cancer Neg Hx   . Kidney cancer Neg Hx     Social History Social History   Tobacco Use  . Smoking status: Current Every Day Smoker    Packs/day: 0.25    Types: Cigarettes  . Smokeless tobacco: Never Used  Substance Use Topics  . Alcohol use: Yes    Comment: occasionallly  . Drug use: No    Review of Systems  Constitutional: Negative for fever. Eyes: Negative for visual changes. ENT:  Negative for sore throat. Neck: No neck pain  Cardiovascular: Negative for chest pain. Respiratory: Negative for shortness of breath. Gastrointestinal: Negative for abdominal pain, vomiting or diarrhea. Genitourinary: Negative for dysuria. Musculoskeletal: Negative for back pain. Skin: Negative for rash. Neurological: Negative for headaches, weakness or numbness. Psych: No SI or HI  ____________________________________________   PHYSICAL EXAM:  VITAL SIGNS: ED Triage Vitals  Enc Vitals Group     BP 09/07/19 0307 121/81     Pulse Rate  09/07/19 0307 95     Resp 09/07/19 0307 17     Temp 09/07/19 0307 98.6 F (37 C)     Temp Source 09/07/19 0307 Oral     SpO2 09/07/19 0307 100 %     Weight 09/07/19 0308 250 lb (113.4 kg)     Height 09/07/19 0308 5\' 4"  (1.626 m)     Head Circumference --      Peak Flow --      Pain Score 09/07/19 0307 7     Pain Loc --      Pain Edu? --      Excl. in [place]? --     Constitutional: Alert and oriented. Well appearing and in no apparent distress. HEENT:      Head: Normocephalic and atraumatic.         Eyes: Conjunctivae are normal. Sclera is non-icteric.       Mouth/Throat: Mucous membranes are moist.       Neck: Supple with no signs of meningismus. Cardiovascular: Regular rate and rhythm.  Respiratory: Normal respiratory effort.  Gastrointestinal: Soft, non tender, and non distended. Musculoskeletal: No edema, cyanosis, or erythema of extremities. Neurologic: Normal speech and language. Face is symmetric. Moving all extremities. No gross focal neurologic deficits are appreciated. Skin: Skin is warm, dry and intact. No rash noted. Psychiatric: Mood and affect are normal. Speech and behavior are normal.  ____________________________________________   LABS (all labs ordered are listed, but only abnormal results are displayed)  Labs Reviewed  COMPREHENSIVE METABOLIC PANEL - Abnormal; Notable for the following components:      Result Value   Potassium 3.3 (*)    Glucose, Bld 111 (*)    Calcium 8.8 (*)    ALT 54 (*)    All other components within normal limits  CBC - Abnormal; Notable for the following components:   WBC 11.2 (*)    All other components within normal limits  ETHANOL  URINE DRUG SCREEN, QUALITATIVE ([facility] ONLY)  POC URINE PREG, ED  POCT PREGNANCY, URINE   ____________________________________________  EKG  none  ____________________________________________  RADIOLOGY  none  ____________________________________________   PROCEDURES  Procedure(s)  performed: None Procedures Critical Care performed:  None ____________________________________________   INITIAL IMPRESSION / ASSESSMENT AND PLAN / ED COURSE   23 y.o. female with a history of schizophrenia, depression, anxiety who called 911 to get out of her house because she did not feel comfortable being at home while her mother had a female friend at home this evening. She denies any type of abuse by this female friend. She denies SI, HI, hallucinations. No indication for IVC. Based on history of mental health, will consult psychiatry       As part of my medical decision making, I reviewed the following data within the [place] notes reviewed and incorporated, Labs reviewed , Old chart reviewed, A consult was requested and obtained from this/these consultant(s) psychiatry, Notes from prior ED visits and [place] Controlled Substance Database  Patient was evaluated in Emergency Department today for the symptoms described in the history of present illness. Patient was evaluated in the context of the global COVID-19 pandemic, which necessitated consideration that the patient might be at risk for infection with the SARS-CoV-2 virus that causes COVID-19. Institutional protocols and algorithms that pertain to the evaluation of patients at risk for COVID-19 are in a state of rapid change based on information released by regulatory bodies including the CDC and federal and state organizations. These policies and algorithms were followed during the patient's care in the ED.   ____________________________________________   FINAL CLINICAL IMPRESSION(S) / ED DIAGNOSES   Final diagnoses:  Evaluation by psychiatric service required      NEW MEDICATIONS STARTED DURING THIS VISIT:  ED Discharge Orders    None       Note:  This document was prepared using Dragon voice recognition software and may include unintentional dictation errors.    Don Perking, [place], MD  09/07/19 [phone]

## 2019-09-07 NOTE — ED Notes (Signed)
Hourly rounding reveals patient in hall bed. No complaints, stable, in no acute distress. Q15 minute rounds and monitoring via Rover and Officer to continue.  

## 2019-09-07 NOTE — Discharge Instructions (Addendum)
Thank you for letting us take care of you in the emergency department today.  ° °Please continue to take any regular, prescribed medications.  ° °Please follow up with: °- Your primary care doctor to review your ER visit and follow up on your symptoms.  ° °Please return to the ER for any new or worsening symptoms.  ° °

## 2019-09-07 NOTE — ED Notes (Signed)
Dressed pt out. Shorts, underwear, sandals, tshirt, purse, phone.

## 2019-09-07 NOTE — ED Notes (Signed)
TTS consult will be completed after visit with ED physician. Orders were entered prior to patient being seen by the ED physician.

## 2019-09-20 ENCOUNTER — Other Ambulatory Visit: Payer: Self-pay

## 2019-09-20 DIAGNOSIS — Z5321 Procedure and treatment not carried out due to patient leaving prior to being seen by health care provider: Secondary | ICD-10-CM | POA: Insufficient documentation

## 2019-09-20 DIAGNOSIS — K5900 Constipation, unspecified: Secondary | ICD-10-CM | POA: Insufficient documentation

## 2019-09-20 NOTE — ED Triage Notes (Signed)
Pt states has not had a bowel movement in three days. Pt states is passing flatus. Pt denies vomiting, denies pain, fever.

## 2019-09-21 ENCOUNTER — Emergency Department
Admission: EM | Admit: 2019-09-21 | Discharge: 2019-09-21 | Disposition: A | Discharge status: Home / Self Care | Payer: Medicaid Other | Attending: Emergency Medicine | Admitting: Emergency Medicine

## 2019-09-21 NOTE — ED Notes (Signed)
Pt not in lobby or outside.  

## 2019-09-21 NOTE — ED Notes (Signed)
Pt not in lobby.  

## 2019-11-28 ENCOUNTER — Encounter: Payer: Self-pay | Admitting: Psychiatry

## 2019-11-28 ENCOUNTER — Emergency Department
Admission: EM | Admit: 2019-11-28 | Discharge: 2019-11-28 | Disposition: A | Discharge status: Other Acute Inpatient Hospital | Payer: Medicaid Other | Attending: Emergency Medicine | Admitting: Emergency Medicine

## 2019-11-28 ENCOUNTER — Other Ambulatory Visit: Payer: Self-pay

## 2019-11-28 ENCOUNTER — Inpatient Hospital Stay
Admission: EM | Admit: 2019-11-28 | Discharge: 2019-12-03 | DRG: 885 | Disposition: A | Discharge status: Home / Self Care | Payer: Medicaid Other | Source: Intra-hospital | Attending: Psychiatry | Admitting: Psychiatry

## 2019-11-28 DIAGNOSIS — F209 Schizophrenia, unspecified: Secondary | ICD-10-CM | POA: Diagnosis not present

## 2019-11-28 DIAGNOSIS — F200 Paranoid schizophrenia: Secondary | ICD-10-CM | POA: Diagnosis present

## 2019-11-28 DIAGNOSIS — Z9119 Patient's noncompliance with other medical treatment and regimen: Secondary | ICD-10-CM | POA: Diagnosis not present

## 2019-11-28 DIAGNOSIS — R443 Hallucinations, unspecified: Secondary | ICD-10-CM | POA: Diagnosis present

## 2019-11-28 DIAGNOSIS — J45909 Unspecified asthma, uncomplicated: Secondary | ICD-10-CM | POA: Insufficient documentation

## 2019-11-28 DIAGNOSIS — R451 Restlessness and agitation: Secondary | ICD-10-CM | POA: Diagnosis present

## 2019-11-28 DIAGNOSIS — Z20822 Contact with and (suspected) exposure to covid-19: Secondary | ICD-10-CM | POA: Diagnosis not present

## 2019-11-28 DIAGNOSIS — Z046 Encounter for general psychiatric examination, requested by authority: Secondary | ICD-10-CM | POA: Diagnosis not present

## 2019-11-28 DIAGNOSIS — Z9114 Patient's other noncompliance with medication regimen: Secondary | ICD-10-CM | POA: Diagnosis not present

## 2019-11-28 DIAGNOSIS — F17210 Nicotine dependence, cigarettes, uncomplicated: Secondary | ICD-10-CM | POA: Insufficient documentation

## 2019-11-28 DIAGNOSIS — R251 Tremor, unspecified: Secondary | ICD-10-CM | POA: Diagnosis present

## 2019-11-28 DIAGNOSIS — F329 Major depressive disorder, single episode, unspecified: Secondary | ICD-10-CM | POA: Diagnosis present

## 2019-11-28 DIAGNOSIS — F22 Delusional disorders: Secondary | ICD-10-CM

## 2019-11-28 DIAGNOSIS — G4700 Insomnia, unspecified: Secondary | ICD-10-CM | POA: Diagnosis present

## 2019-11-28 DIAGNOSIS — F419 Anxiety disorder, unspecified: Secondary | ICD-10-CM | POA: Diagnosis present

## 2019-11-28 DIAGNOSIS — Z91148 Patient's other noncompliance with medication regimen for other reason: Secondary | ICD-10-CM

## 2019-11-28 DIAGNOSIS — Z79899 Other long term (current) drug therapy: Secondary | ICD-10-CM

## 2019-11-28 LAB — CBC
HCT: 37.7 % (ref 36.0–46.0)
Hemoglobin: 12.7 g/dL (ref 12.0–15.0)
MCH: 27.1 pg (ref 26.0–34.0)
MCHC: 33.7 g/dL (ref 30.0–36.0)
MCV: 80.6 fL (ref 80.0–100.0)
Platelets: 221 10*3/uL (ref 150–400)
RBC: 4.68 MIL/uL (ref 3.87–5.11)
RDW: 13.9 % (ref 11.5–15.5)
WBC: 14.3 10*3/uL — ABNORMAL HIGH (ref 4.0–10.5)
nRBC: 0 % (ref 0.0–0.2)

## 2019-11-28 LAB — SALICYLATE LEVEL: Salicylate Lvl: <7 mg/dL — ABNORMAL LOW (ref 7.0–30.0)

## 2019-11-28 LAB — TSH: TSH: 0.527 u[IU]/mL (ref 0.350–4.500)

## 2019-11-28 LAB — COMPREHENSIVE METABOLIC PANEL
ALT: 45 U/L — ABNORMAL HIGH (ref 0–44)
AST: 27 U/L (ref 15–41)
Albumin: 4.2 g/dL (ref 3.5–5.0)
Alkaline Phosphatase: 97 U/L (ref 38–126)
Anion gap: 11 (ref 5–15)
BUN: 8 mg/dL (ref 6–20)
CO2: 24 mmol/L (ref 22–32)
Calcium: 9.3 mg/dL (ref 8.9–10.3)
Chloride: 105 mmol/L (ref 98–111)
Creatinine, Ser: 0.83 mg/dL (ref 0.44–1.00)
GFR calc Af Amer: >60 mL/min (ref >60)
GFR calc non Af Amer: >60 mL/min (ref >60)
Glucose, Bld: 108 mg/dL — ABNORMAL HIGH (ref 70–99)
Potassium: 3.2 mmol/L — ABNORMAL LOW (ref 3.5–5.1)
Sodium: 140 mmol/L (ref 135–145)
Total Bilirubin: 1 mg/dL (ref 0.3–1.2)
Total Protein: 7.7 g/dL (ref 6.5–8.1)

## 2019-11-28 LAB — URINE DRUG SCREEN, QUALITATIVE (ARMC ONLY)
Amphetamines, Ur Screen: NOT DETECTED
Barbiturates, Ur Screen: NOT DETECTED
Benzodiazepine, Ur Scrn: NOT DETECTED
Cannabinoid 50 Ng, Ur ~~LOC~~: NOT DETECTED
Cocaine Metabolite,Ur ~~LOC~~: NOT DETECTED
MDMA (Ecstasy)Ur Screen: NOT DETECTED
Methadone Scn, Ur: NOT DETECTED
Opiate, Ur Screen: NOT DETECTED
Phencyclidine (PCP) Ur S: NOT DETECTED
Tricyclic, Ur Screen: NOT DETECTED

## 2019-11-28 LAB — RESPIRATORY PANEL BY RT PCR (FLU A&B, COVID)
Influenza A by PCR: NEGATIVE
Influenza B by PCR: NEGATIVE
SARS Coronavirus 2 by RT PCR: NEGATIVE

## 2019-11-28 LAB — ACETAMINOPHEN LEVEL: Acetaminophen (Tylenol), Serum: <10 ug/mL — ABNORMAL LOW (ref 10–30)

## 2019-11-28 LAB — HEMOGLOBIN A1C
Hgb A1c MFr Bld: 5.1 % (ref 4.8–5.6)
Mean Plasma Glucose: 99.67 mg/dL

## 2019-11-28 LAB — ETHANOL: Alcohol, Ethyl (B): <10 mg/dL (ref <10)

## 2019-11-28 LAB — LIPID PANEL
Cholesterol: 148 mg/dL (ref 0–200)
HDL: 33 mg/dL — ABNORMAL LOW (ref >40)
LDL Cholesterol: 107 mg/dL — ABNORMAL HIGH (ref 0–99)
Total CHOL/HDL Ratio: 4.5 RATIO
Triglycerides: 42 mg/dL (ref <150)
VLDL: 8 mg/dL (ref 0–40)

## 2019-11-28 LAB — PREGNANCY, URINE: Preg Test, Ur: NEGATIVE

## 2019-11-28 MED ORDER — ALUM & MAG HYDROXIDE-SIMETH 200-200-20 MG/5ML PO SUSP: 30.0000 mL | ORAL | Status: DC | PRN

## 2019-11-28 MED ORDER — ACETAMINOPHEN 325 MG PO TABS: 650.0000 mg | ORAL_TABLET | Freq: Four times a day (QID) | ORAL | Status: DC | PRN

## 2019-11-28 MED ORDER — ARIPIPRAZOLE 10 MG PO TABS
20.0000 mg | ORAL_TABLET | Freq: Every day | ORAL | Status: DC
  Administered 2019-11-28 – 2019-11-29 (×2): 20 mg via ORAL
  Filled 2019-11-28 (×3): qty 2

## 2019-11-28 MED ORDER — NICOTINE 21 MG/24HR TD PT24
21.0000 mg | MEDICATED_PATCH | Freq: Every day | TRANSDERMAL | Status: DC
  Administered 2019-11-28 – 2019-12-03 (×5): 21 mg via TRANSDERMAL
  Filled 2019-11-28 (×7): qty 1

## 2019-11-28 MED ORDER — RISPERIDONE 1 MG PO TABS: 4.0000 mg | ORAL_TABLET | Freq: Two times a day (BID) | ORAL | Status: DC

## 2019-11-28 MED ORDER — DROPERIDOL 2.5 MG/ML IJ SOLN
5.0000 mg | Freq: Once | INTRAMUSCULAR | Status: AC
  Administered 2019-11-28: 5 mg via INTRAMUSCULAR

## 2019-11-28 MED ORDER — POTASSIUM CHLORIDE CRYS ER 20 MEQ PO TBCR
20.0000 meq | EXTENDED_RELEASE_TABLET | Freq: Once | ORAL | Status: AC
  Administered 2019-11-28: 20 meq via ORAL
  Filled 2019-11-28: qty 1

## 2019-11-28 MED ORDER — GABAPENTIN 300 MG PO CAPS: 300.0000 mg | ORAL_CAPSULE | Freq: Three times a day (TID) | ORAL | Status: DC

## 2019-11-28 MED ORDER — OMEGA-3-ACID ETHYL ESTERS 1 G PO CAPS
1.0000 g | ORAL_CAPSULE | Freq: Two times a day (BID) | ORAL | Status: DC
  Filled 2019-11-28 (×2): qty 1

## 2019-11-28 MED ORDER — BENZTROPINE MESYLATE 1 MG PO TABS: 0.5000 mg | ORAL_TABLET | Freq: Two times a day (BID) | ORAL | Status: DC

## 2019-11-28 MED ORDER — MAGNESIUM HYDROXIDE 400 MG/5ML PO SUSP: 30.0000 mL | Freq: Every day | ORAL | Status: DC | PRN

## 2019-11-28 NOTE — ED Notes (Signed)
Transported to behavioral med via WC.

## 2019-11-28 NOTE — ED Notes (Signed)
Pt. Here under IVC.  Pt. States "I don's know why I am here, "I think my mother called the police".  Pt. Denies SI/HI.  When patient asked "Have you been taking your medications?, Pt. States "I have not been taking medications, because I don't need to take them".  Pt. States she has a 24 year old daughter at home.  Pt. Also indicates she lives with mother and her daughter.

## 2019-11-28 NOTE — [facility] Suicide Risk Assessment (Signed)
[facility] INPATIENT:  Family/Significant Other Suicide Prevention Education  Suicide Prevention Education:  Contact Attempts: Ventura Bruns, mother, [phone],  has been identified by the patient as the family member/significant other with whom the patient will be residing, and identified as the person(s) who will aid the patient in the event of a mental health crisis.  With written consent from the patient, two attempts were made to provide suicide prevention education, prior to and/or following the patient's discharge.  We were unsuccessful in providing suicide prevention education.  A suicide education pamphlet was given to the patient to share with family/significant other.  Date and time of first attempt:11/28/19 Date and time of second attempt:  Lorri Frederick, LCSW 11/28/2019, 11:38 AM

## 2019-11-28 NOTE — BH Assessment (Addendum)
Assessment Note  Tracy Poole is a 24 y.o. female who was brought to [facility] ED by [employer] under IVC paperwork that was completed by her mother. Per EDP and RN notes, pt believes that her mother put a spell on her daughter, has not been taking her medications, and has been PA towards her mother. During clinician's assessment with pt, pt denies knowing why she is at the hospital. Pt states, "I want to go home. My mom called the police--we had a spat. I definitely don't need no medication."  Clinician completed the remainder of the assessment with pt, but pt continued to deny all questions posed, including SI, a hx of SI, any prior attempts to kill herself, any prior hospitalizations for mental health reasons, or any plans to kill herself. Clinician inquired as to whether pt currently has or has had a therapist and/or a psychiatrist, and pt stated that no, she doesn't have/hasn't had either, because she is fine and doesn't need them. Pt continued to deny any current or prior hx of HI, AVH, NSSIB, access to guns/weapons, engagement with the legal system, or SA. Pt's UDA was negative for all substances and for EtOH.  Pt denied a family hx of SI, SA, or MH diagnoses. She denied a hx of abuse being exposed or forced onto her. She stated she has been sleeping and eating well but was unable to give specifics of how many hours of sleep she has been getting; she was also unable to provide when she goes to bed and when she gets up ("when I get sleepy" and "when I get up" were answers she provided).  Pt was oriented x4. Her recent and remote memory was UTA. Pt was standoff-ish throughout the assessment process. Pt's insight, judgement, and impulse control is poor at this time.   Diagnosis: F20.9, Schizophrenia   Past Medical History:  Past Medical History:  Diagnosis Date  . Anxiety   . Asthma   . Depression   . Schizophrenia [facility])     Past Surgical History:  Procedure Laterality Date  . CESAREAN SECTION     . EUSTACHIAN TUBE DILATION    . HERNIA REPAIR      Family History:  Family History  Problem Relation Age of Onset  . Bladder Cancer Neg Hx   . Kidney cancer Neg Hx     Social History:  reports that she has been smoking cigarettes. She has been smoking about 0.25 packs per day. She has never used smokeless tobacco. She reports current alcohol use. She reports that she does not use drugs.  Additional Social History:  Alcohol / Drug Use Pain Medications: Please see MAR Prescriptions: Please see MAR Over the Counter: Please see MAR History of alcohol / drug use?: No history of alcohol / drug abuse Longest period of sobriety (when/how long): Pt denies SA  CIWA: CIWA-Ar BP: 130/77 Pulse Rate: (!) 107 COWS:    Allergies: No Known Allergies  Home Medications: (Not in a hospital admission)   OB/GYN Status:  No LMP recorded (lmp unknown). (Menstrual status: Irregular Periods).  General Assessment Data Location of Assessment: [facility] ED TTS Assessment: In system Is this a Tele or Face-to-Face Assessment?: Face-to-Face Is this an Initial Assessment or a Re-assessment for this encounter?: Initial Assessment Patient Accompanied by:: N/A Language Other than English: No Living Arrangements: Other (Comment)(Pt lives with her mother and daughter) What gender do you identify as?: Female Marital status: Single Pregnancy Status: No Living Arrangements: Parent, Children Can pt  return to current living arrangement?: Yes Admission Status: Involuntary Petitioner: Family member Is patient capable of signing voluntary admission?: Yes Referral Source: Self/Family/Friend Insurance type: Medicaid [place]     Crisis Care Plan Living Arrangements: Parent, Children Legal Guardian: Other:(Self) Name of Psychiatrist: None Name of Therapist: None  Education Status Is patient currently in school?: No Is the patient employed, unemployed or receiving disability?: Unemployed  Risk to self with the  past 6 months Suicidal Ideation: No(Pt denies) Has patient been a risk to self within the past 6 months prior to admission? : No(Pt denies) Suicidal Intent: No(Pt denies) Has patient had any suicidal intent within the past 6 months prior to admission? : No(Pt denies) Is patient at risk for suicide?: No(Pt denies) Suicidal Plan?: No(Pt denies) Has patient had any suicidal plan within the past 6 months prior to admission? : No(Pt denies) Access to Means: No(Pt denies) What has been your use of drugs/alcohol within the last 12 months?: Pt denies Previous Attempts/Gestures: No How many times?: (Pt denies) Other Self Harm Risks: Pt denies; her mother states she is experiencing magical thinking, is paranoid, etc Triggers for Past Attempts: None known Intentional Self Injurious Behavior: (Pt denies) Family Suicide History: No(Pt denies) Recent stressful life event(s): Conflict (Comment)(Pt is in conflict w/ her mother) Persecutory voices/beliefs?: Yes(Pt believes her mother put a spell on her daughter) Depression: No Depression Symptoms: Isolating, Fatigue, Guilt, Feeling angry/irritable Substance abuse history and/or treatment for substance abuse?: No Suicide prevention information given to non-admitted patients: Not applicable  Risk to Others within the past 6 months Homicidal Ideation: (Pt denies) Does patient have any lifetime risk of violence toward others beyond the six months prior to admission? : (Pt's mother states pt was geetting violent/PA towards her) Thoughts of Harm to Others: (Pt's mother states pt was geetting violent/PA towards her) Current Homicidal Intent: (Pt's mother states pt was geetting violent/PA towards her) Current Homicidal Plan: No Access to Homicidal Means: No Identified Victim: Pt's mother History of harm to others?: Yes(Pt's mother states pt was geetting violent/PA towards her) Assessment of Violence: On admission Violent Behavior Description: Threatening her  mother, becoming PA Does patient have access to weapons?: (Pt denies access to weapons) Criminal Charges Pending?: No Does patient have a court date: No Is patient on probation?: No  Psychosis Hallucinations: (UTA; pt denies, though it's unclear if she's being honest) Delusions: (UTA; pt denies, though it's unclear if she's being honest)  Mental Status Report Appearance/Hygiene: Bizarre, In scrubs Eye Contact: Good Motor Activity: Agitation, Mannerisms Speech: Pressured Level of Consciousness: Quiet/awake Mood: Anxious Affect: Blunted, Flat Anxiety Level: Moderate Thought Processes: Circumstantial Judgement: Impaired Orientation: Person, Place, Time, Situation Obsessive Compulsive Thoughts/Behaviors: Minimal  Cognitive Functioning Concentration: Decreased Memory: Unable to Assess Is patient IDD: No Insight: Poor Impulse Control: Poor Appetite: (UTA) Have you had any weight changes? : (UTA) Sleep: Unable to Assess Total Hours of Sleep: (UTA) Vegetative Symptoms: Unable to Assess  ADLScreening [facility] Assessment Services) Patient's cognitive ability adequate to safely complete daily activities?: Yes Patient able to express need for assistance with ADLs?: Yes Independently performs ADLs?: Yes (appropriate for developmental age)  Prior Inpatient Therapy Prior Inpatient Therapy: Yes(Pt denies) Prior Therapy Dates: Unknown Prior Therapy Facilty/Provider(s): Unknown  Reason for Treatment: Unknown  Prior Outpatient Therapy Prior Outpatient Therapy: No Does patient have an ACCT team?: No Does patient have Intensive In-House Services?  : No Does patient have [facility] services? : No Does patient have P4CC services?: No  ADL Screening (condition  at time of admission) Patient's cognitive ability adequate to safely complete daily activities?: Yes Is the patient deaf or have difficulty hearing?: No Does the patient have difficulty seeing, even when wearing glasses/contacts?:  No Does the patient have difficulty concentrating, remembering, or making decisions?: Yes Patient able to express need for assistance with ADLs?: Yes Does the patient have difficulty dressing or bathing?: No Independently performs ADLs?: Yes (appropriate for developmental age) Does the patient have difficulty walking or climbing stairs?: No Weakness of Legs: None Weakness of Arms/Hands: None  Home Assistive Devices/Equipment Home Assistive Devices/Equipment: None  Therapy Consults (therapy consults require a physician order) PT Evaluation Needed: No OT Evalulation Needed: No SLP Evaluation Needed: No Abuse/Neglect Assessment (Assessment to be complete while patient is alone) Abuse/Neglect Assessment Can Be Completed: Yes Physical Abuse: Denies Verbal Abuse: Denies Sexual Abuse: Denies Exploitation of patient/patient's resources: Denies Self-Neglect: Denies Values / Beliefs Cultural Requests During Hospitalization: None Spiritual Requests During Hospitalization: None Consults Spiritual Care Consult Needed: No Transition of Care Team Consult Needed: No Advance Directives (For Healthcare) Does Patient Have a Medical Advance Directive?: No Would patient like information on creating a medical advance directive?: No - Patient declined          Disposition: Anette Riedel, NP, reviewed pt's chart and information and met with pt and determined pt meets inpatient criteria. Pt has been accepted to [facility] BMU and can arrive at 0900 this morning (11/28/2019). This information was provided to pt's nurse, Catalina Antigua RN, at [phone].   Room: 322  Accepting: Anette Riedel, NP  Attending: Dr. Alethia Berthold  Call to Report: [phone]   Disposition Initial Assessment Completed for this Encounter: Yes Patient referred to: Other (Comment)(Pt has been accepted at [facility])  On Site Evaluation by:   Reviewed with Physician:    Dannielle Burn 11/28/2019 3:18 AM
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
## 2019-11-28 NOTE — ED Notes (Signed)
Per [facility] RN, pt to admit after 8am. Pending room assignment.
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
## 2019-11-28 NOTE — BH Assessment (Signed)
Anette Riedel, NP, reviewed pt's chart and information and met with pt and determined pt meets inpatient criteria. Pt has been accepted to [facility] and can arrive at 0900 this morning (11/28/2019). This information was provided to pt's nurse, Catalina Antigua RN, at [phone].   Room: 322  Accepting: Anette Riedel, NP  Attending: Dr. Alethia Berthold  Call to Report: [phone]
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
## 2019-11-28 NOTE — ED Notes (Signed)
TTS(Tracy Poole) talking to patient at this time in room #23.
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
## 2019-11-28 NOTE — BHH Group Notes (Signed)
B[facility] Group Notes:  (Nursing/MHT/Case Management/Adjunct)  Date:  11/28/2019  Time:  8:38 PM  Type of Therapy:  Group Therapy  Participation Level:  Active  Participation Quality:  Appropriate  Affect:  Appropriate  Cognitive:  Alert  Insight:  Good  Engagement in Group:  Engaged  Modes of Intervention:  Support  Summary of Progress/Problems:  Tracy Poole 11/28/2019, 8:38 PM

## 2019-11-28 NOTE — ED Notes (Signed)
Pt. Attempting to leave quad unit under IVC, pt. Reminded that she was under commitment and she could not leave without a doctor's order.  Pt. Redirected to room.  Pt. Continues to say "I am leaving and no one is stopping me"  Pt. States "I can read minds".  Pt. States, "I am god, don't you see my wings".
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
## 2019-11-28 NOTE — ED Notes (Signed)
Anette Riedel, NP, reviewed pt's chart and information and met with pt and determined pt meets inpatient criteria. Pt has been accepted to [facility] and can arrive at 0900 this morning (11/28/2019). This information was provided to pt's nurse, Catalina Antigua RN, at [phone].   Room: 322  Accepting: Anette Riedel, NP  Attending: Dr. Alethia Berthold  Call to Report: [phone]

## 2019-11-28 NOTE — B[facility] Group Notes (Signed)
LCSW Aftercare Discharge Planning Group Note   11/28/2019 1230pm  Type of Group and Topic: Psychoeducational Group:  Discharge Planning  Participation Level:  Minimal  Description of Group  Discharge planning group reviews patient's anticipated discharge plans and assists patients to anticipate and address any barriers to wellness/recovery in the community.  Suicide prevention education is reviewed with patients in group.  Therapeutic Goals 1. Patients will state their anticipated discharge plan and mental health aftercare 2. Patients will identify potential barriers to wellness in the community setting 3. Patients will engage in problem solving, solution focused discussion of ways to anticipate and address barriers to wellness/recovery  Summary of Patient Progress: Pt came and left the group room multiple times.  Pt responded to CSW questions and identified wellness areas: spiritual and social.  Pt lost interest halfway through group and closed her eyes and appeared asleep by the end.     Plan for Discharge/Comments:    Transportation Means:   Supports:  Therapeutic Modalities: Motivational Interviewing    Lorri Frederick, LCSW 11/28/2019 1:37 PM

## 2019-11-28 NOTE — ED Notes (Signed)
Patient belongings placed in labeled belongings bag to be secured on the unit.  Orange colored t-shirt, blue colored scrub pants, black colored nike slides.
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
## 2019-11-28 NOTE — Progress Notes (Signed)
D: Patient skin assessment completed with Aurther Loft RN and MHT Kayla, skin is intact, no contraband found. Patient is low fall risk. Denies SI/HI/AVH. Patient denies Depression and anxiety.   A: Patient oriented to unit/room/call light. Patient was offered support and encouragement. Patient was encourage to attend groups, participate in unit activities and continue with plan of care. Q x 15 minute observation checks were completed for safety.   R: Patient has no complaints at this time. Patient is receptive to treatment and safety maintained on unit.

## 2019-11-28 NOTE — Tx Team (Signed)
Initial Treatment Plan 11/28/2019 9:47 AM [name] YOF:[id number]    PATIENT STRESSORS: Financial difficulties Legal issue Marital or family conflict   PATIENT STRENGTHS: Ability for insight Average or above average intelligence Capable of independent living   PATIENT IDENTIFIED PROBLEMS: Ineffective coping skills  Unstable mood                   DISCHARGE CRITERIA:  Ability to meet basic life and health needs Adequate post-discharge living arrangements Improved stabilization in mood, thinking, and/or behavior  PRELIMINARY DISCHARGE PLAN: Attend aftercare/continuing care group Participate in family therapy Return to previous living arrangement  PATIENT/FAMILY INVOLVEMENT: This treatment plan has been presented to and reviewed with the patient, Tracy Poole.  The patient and family have been given the opportunity to ask questions and make suggestions.  Berkley Harvey, RN 11/28/2019, 9:47 AM

## 2019-11-28 NOTE — Plan of Care (Signed)
Newly admitted. Anxious and restless. Frequently reporting that she needs to take her Abilify maintenna then go home. Frequently presenting to the nurses station, asking for her phone "I really need my phone..I have to have it". Patient is frequently redirected. No aggressive behaviors noted. Support and encouragements provided. Safety monitored as recommended.
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
## 2019-11-28 NOTE — ED Notes (Signed)
NP(Tracy Poole) talking to patient in room #23.

## 2019-11-29 DIAGNOSIS — F22 Delusional disorders: Secondary | ICD-10-CM

## 2019-11-29 MED ORDER — BENZTROPINE MESYLATE 1 MG PO TABS: 2.0000 mg | ORAL_TABLET | Freq: Two times a day (BID) | ORAL | Status: DC | PRN

## 2019-11-29 MED ORDER — ARIPIPRAZOLE ER 400 MG IM SRER
400.0000 mg | INTRAMUSCULAR | Status: DC
  Administered 2019-11-29: 400 mg via INTRAMUSCULAR
  Filled 2019-11-29: qty 2
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
## 2019-11-29 NOTE — Plan of Care (Signed)
°  Problem: Education: °Goal: Emotional status will improve °Outcome: Progressing °Goal: Mental status will improve °Outcome: Progressing °Goal: Verbalization of understanding the information provided will improve °Outcome: Progressing °  °

## 2019-11-29 NOTE — Plan of Care (Signed)
D- Patient alert and oriented. Patient presents in a pleasant mood on assessment stating that she slept ok last night and had no complaints to voice to this Clinical research associate. Patient denies any signs/symptoms of depression/anxiety reporting "I'm feeling good". Patient also denies SI, HI, AVH, and pain at this time. Patient's goal for today is "to go home today cause nothing is wrong with me", in which she will "do the things I'm supposed to", in order to accomplish her goal.  A- Scheduled medications administered to patient, per MD orders. Support and encouragement provided.  Routine safety checks conducted every 15 minutes.  Patient informed to notify staff with problems or concerns.  R- No adverse drug reactions noted. Patient contracts for safety at this time. Patient compliant with medications and treatment plan. Patient receptive, calm, and cooperative. Patient interacts well with others on the unit.  Patient remains safe at this time.  Problem: Education: Goal: Knowledge of [place] [facility] Education information/materials will improve Outcome: Progressing Goal: Emotional status will improve Outcome: Progressing Goal: Mental status will improve Outcome: Progressing Goal: Verbalization of understanding the information provided will improve Outcome: Progressing   Problem: Activity: Goal: Interest or engagement in activities will improve Outcome: Progressing Goal: Sleeping patterns will improve Outcome: Progressing   Problem: Safety: Goal: Periods of time without injury will increase Outcome: Progressing

## 2019-11-29 NOTE — Progress Notes (Signed)
Presents with bright affect. Pleasant and cooperative with care. Denies SI, HI, AVH. Visible in milieu interacting appropriately with staff and peers.No abnormal behavior noted. In no distress. Reports would like to go home. Denies all, SI, HI, AVH. No meds due on this shift. Reports does not need any sleep aid at present. Encouragement and support offered. Safety checks maintained. Pt receptive and remains safe on unit with q 15 min checks.

## 2019-11-30 DIAGNOSIS — F200 Paranoid schizophrenia: Principal | ICD-10-CM

## 2019-11-30 NOTE — [facility] Group Notes (Signed)
Overcoming Obstacles  11/30/2019 1PM  Type of Therapy and Topic:  Group Therapy:  Overcoming Obstacles  Participation Level:  None    Description of Group:    In this group patients will be encouraged to explore what they see as obstacles to their own wellness and recovery. They will be guided to discuss their thoughts, feelings, and behaviors related to these obstacles. The group will process together ways to cope with barriers, with attention given to specific choices patients can make. Each patient will be challenged to identify changes they are motivated to make in order to overcome their obstacles. This group will be process-oriented, with patients participating in exploration of their own experiences as well as giving and receiving support and challenge from other group members.   Therapeutic Goals: 1. Patient will identify personal and current obstacles as they relate to admission. 2. Patient will identify barriers that currently interfere with their wellness or overcoming obstacles.  3. Patient will identify feelings, thought process and behaviors related to these barriers. 4. Patient will identify two changes they are willing to make to overcome these obstacles:      Summary of Patient Progress Patient attended session but no input provided. Patient sat quietly and respected boundaries during session.    Therapeutic Modalities:   Cognitive Behavioral Therapy Solution Focused Therapy Motivational Interviewing Relapse Prevention Therapy    Lowella Dandy, MSW, LCSW 11/30/2019 1:50 PM

## 2019-11-30 NOTE — Plan of Care (Signed)
D- Patient alert and oriented. Patient presents in a preoccupied, but pleasant mood on assessment stating that she slept good last night and had no complaints to voice to this Clinical research associate. Patient denies SI, HI, AVH, and pain at this time. Patient also denies depression and anxiety stating that "I feel great, ready to go home". Patient has been somewhat frustrated with staff stating that she is ready to go and feels like since she took the shot yesterday, she does not need the pills. This Clinical research associate tried to inform patient about why she should continue to take the pills, however, patient was not too receptive.  Patient's goal for today is "going home cause there is nothing wrong with me", in which she will "do what I'm supposed to do", in order to achieve her goal.  A- Support and encouragement provided.  Routine safety checks conducted every 15 minutes.  Patient informed to notify staff with problems or concerns.  R- Patient contracts for safety at this time. Patient compliant with treatment plan. Patient receptive, calm, and cooperative. Patient interacts well with others on the unit.  Patient remains safe at this time.  Problem: Education: Goal: Knowledge of [place] [facility] Education information/materials will improve Outcome: Not Progressing Goal: Emotional status will improve Outcome: Not Progressing Goal: Mental status will improve Outcome: Not Progressing Goal: Verbalization of understanding the information provided will improve Outcome: Not Progressing   Problem: Activity: Goal: Interest or engagement in activities will improve Outcome: Not Progressing Goal: Sleeping patterns will improve Outcome: Not Progressing   Problem: Safety: Goal: Periods of time without injury will increase Outcome: Not Progressing

## 2019-11-30 NOTE — Progress Notes (Addendum)
Patient just came up to the nurses station upset about her breakfast tray, stating "why am I on a diet, there's nothing wrong with me, I'm pregnant". Patient also stated "I hope I'm going home today because they know who I really am, I read minds, I can hear what y'all are thinking". This Clinical research associate informed patient that the MD would be notified to see if her diet order could be changed.

## 2019-11-30 NOTE — Progress Notes (Signed)
Recreation Therapy Notes   Date: 11/29/2018  Time: 9:30 am  Location: Craft room  Behavioral response: Appropriate  Intervention Topic: Relaxation    Discussion/Intervention:  Group content today was focused on relaxation. The group defined relaxation and identified healthy ways to relax. Individuals expressed how much time they spend relaxing. Patients expressed how much their life would be if they did not make time for themselves to relax. The group stated ways they could improve their relaxation techniques in the future.  Individuals participated in the intervention "Time to Relax" where they had a chance to experience different relaxation techniques.  Clinical Observations/Feedback:  Patient came to group and defined relaxation as going home. Individual participated in the intervention during group and was social with peers and staff. [name] LRT/CTRS         Dreshaun Stene 11/30/2019 11:33 AM

## 2019-11-30 NOTE — Progress Notes (Signed)
This Clinical research associate was just notified that patient had some issues nausea/vomiting down in the dayroom.

## 2019-11-30 NOTE — Progress Notes (Signed)
Patient refused her morning medications stating that "I took the shot yesterday, and this is not a new medication". Patient refused her Nicotine patch stating "it doesn't work". MD will be notified.
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
## 2019-12-01 MED ORDER — DIPHENHYDRAMINE HCL 50 MG/ML IJ SOLN
50.0000 mg | Freq: Four times a day (QID) | INTRAMUSCULAR | Status: DC | PRN
  Administered 2019-12-01 – 2019-12-02 (×2): 50 mg via INTRAMUSCULAR
  Filled 2019-12-01 (×2): qty 1

## 2019-12-01 MED ORDER — HALOPERIDOL LACTATE 5 MG/ML IJ SOLN
5.0000 mg | Freq: Four times a day (QID) | INTRAMUSCULAR | Status: DC | PRN
  Administered 2019-12-01 – 2019-12-02 (×2): 5 mg via INTRAMUSCULAR
  Filled 2019-12-01 (×2): qty 1

## 2019-12-01 MED ORDER — LORAZEPAM 2 MG/ML IJ SOLN
2.0000 mg | Freq: Four times a day (QID) | INTRAMUSCULAR | Status: DC | PRN
  Administered 2019-12-01: 2 mg via INTRAMUSCULAR
  Filled 2019-12-01: qty 1

## 2019-12-01 MED ORDER — HALOPERIDOL 5 MG PO TABS
5.0000 mg | ORAL_TABLET | Freq: Four times a day (QID) | ORAL | Status: DC | PRN
  Filled 2019-12-01: qty 1

## 2019-12-01 MED ORDER — LORAZEPAM 2 MG PO TABS
2.0000 mg | ORAL_TABLET | Freq: Four times a day (QID) | ORAL | Status: DC | PRN
  Administered 2019-12-02: 2 mg via ORAL
  Filled 2019-12-01: qty 1

## 2019-12-01 MED ORDER — DIPHENHYDRAMINE HCL 25 MG PO CAPS
50.0000 mg | ORAL_CAPSULE | Freq: Four times a day (QID) | ORAL | Status: DC | PRN
  Filled 2019-12-01: qty 2

## 2019-12-01 MED ORDER — ARIPIPRAZOLE 10 MG PO TABS
20.0000 mg | ORAL_TABLET | Freq: Every day | ORAL | Status: DC
  Filled 2019-12-01: qty 2

## 2019-12-01 MED ORDER — ZIPRASIDONE MESYLATE 20 MG IM SOLR: 20.0000 mg | Freq: Four times a day (QID) | INTRAMUSCULAR | Status: DC | PRN

## 2019-12-01 MED ORDER — TRAZODONE HCL 50 MG PO TABS
50.0000 mg | ORAL_TABLET | Freq: Every evening | ORAL | Status: DC | PRN
  Administered 2019-12-02: 50 mg via ORAL
  Filled 2019-12-01: qty 1

## 2019-12-01 NOTE — Progress Notes (Signed)
This Clinical research associate encouraged patient to take her medication and try to get some sleep tonight. Patient stated that "when I'm home I'm usually up, so I don't look forward to sleeping".

## 2019-12-01 NOTE — Plan of Care (Signed)
D- Patient alert and oriented. Patient presents in a preoccupied, but pleasant mood on assessment fixated on discharging today. Patient reports that she slept good last night, however, it was reported during shift change, that patient stayed up all night wanting to go to the ER stating that she was having twins. Patient denies any signs/sympyoms of depression/anxiety. Patient also denies SI, HI, AVH, and pain. Patient's goal for today is "going home cause there is nothing wrong with me", in which she will "do what they tell me to", in order to accomplish her goals, however, she continues to refuse scheduled medication because 'I don't need medicine".  A- Support and encouragement provided.  Routine safety checks conducted every 15 minutes. Patient informed to notify staff with problems or concerns.  R- Patient contracts for safety at this time. Patient compliant with treatment plan. Patient receptive, calm, and cooperative. Patient remains safe at this time.  Problem: Education: Goal: Knowledge of [place] [facility] Education information/materials will improve Outcome: Not Progressing Goal: Emotional status will improve Outcome: Not Progressing Goal: Mental status will improve Outcome: Not Progressing Goal: Verbalization of understanding the information provided will improve Outcome: Not Progressing   Problem: Activity: Goal: Interest or engagement in activities will improve Outcome: Not Progressing Goal: Sleeping patterns will improve Outcome: Not Progressing   Problem: Safety: Goal: Periods of time without injury will increase Outcome: Not Progressing

## 2019-12-01 NOTE — Progress Notes (Signed)
Recreation Therapy Notes  Date: 12/01/2019  Time: 9:30 am  Location: Craft room  Behavioral response: Appropriate  Intervention Topic: Problem Solving    Discussion/Intervention:  Group content on today was focused on problem solving. The group described what problem solving is. Patients expressed how problems affect them and how they deal with problems. Individuals identified healthy ways to deal with problems. Patients explained what normally happens to them when they do not deal with problems. The group expressed reoccurring problems for them. The group participated in the intervention "Ways to Solve problems" where patients were given a chance to explore different ways to solve problems.  Clinical Observations/Feedback:  Patient came to group and was focused on what peers and staff had to say about problem solving. Individual participated in the intervention during group and was social with peers and staff. Markham Dumlao LRT/CTRS         Kanoelani Dobies 12/01/2019 11:52 AM

## 2019-12-01 NOTE — [facility] Group Notes (Signed)
LCSW Group Therapy Note  12/01/2019 2:40 PM  Type of Therapy/Topic:  Group Therapy:  Feelings about Diagnosis  Participation Level:  None   Description of Group:   This group will allow patients to explore their thoughts and feelings about diagnoses they have received. Patients will be guided to explore their level of understanding and acceptance of these diagnoses. Facilitator will encourage patients to process their thoughts and feelings about the reactions of others to their diagnosis and will guide patients in identifying ways to discuss their diagnosis with significant others in their lives. This group will be process-oriented, with patients participating in exploration of their own experiences, giving and receiving support, and processing challenge from other group members.   Therapeutic Goals: 1. Patient will demonstrate understanding of diagnosis as evidenced by identifying two or more symptoms of the disorder 2. Patient will be able to express two feelings regarding the diagnosis 3. Patient will demonstrate their ability to communicate their needs through discussion and/or role play  Summary of Patient Progress: Patient attended group, however slept throughout. CSW attempted to awaken patient but patient did not budge until group was announced to be over.   Therapeutic Modalities:   Cognitive Behavioral Therapy Brief Therapy Feelings Identification   Penni Homans, MSW, LCSW 12/01/2019 2:40 PM

## 2019-12-01 NOTE — Progress Notes (Signed)
Patient just came up to this writer and asked for her nicotine patch, stating "please don't put nothing in it, I see you every time put something in it". This Clinical research associate explained that all is done for administration is to cut the patch out of the packaging and placing it on the skin". Patient took the patch, smelled it, and asked this writer "why it smell like fish". Patient frowned a little and then proceeded to place the patch on her arm.
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
## 2019-12-01 NOTE — Progress Notes (Signed)
Patient came back to the nurses station and said "can I smoke a [place]". This Clinical research associate explained to patient that smoking is not permitted and she stated "they don't have to know, we can go into the shower, or even out that door, I think that's why I'm like this, I haven't had a [place] in four days and this patch is not working".

## 2019-12-01 NOTE — Progress Notes (Signed)
Patient just came to the nurses station asking again if this writer told the MD about her going home. Patient then proceeds to say "I need to go upstairs, I feel like I'm having a miscarriage, I'm wet in between my legs". This Clinical research associate stated to patient that we weren't aware that she is pregnant and she stated "they put negative on the test, but it's positive".

## 2019-12-01 NOTE — Progress Notes (Signed)
[facility] MD Progress Note  12/01/2019 11:28 AM Tracy Poole  MRN:  [id number] Subjective: Patient is a 24 year old female with a past psychiatric history significant for schizophrenia who presented to the [facility] emergency department on 11/27/2019 secondary to bizarre behavior.  The patient was reportedly hallucinating, noncompliant with her medications, and believe that her mother had put a spell on her.  Objective: Patient is seen and examined.  Patient is a 24 year old female with the above-stated past psychiatric history who is seen in follow-up.  The plan had been to discharge the patient yesterday, but she continued to have some delusional thinking.  After she was told that she would not be able to go home she became very agitated and upset.  Last night she refused oral medications, did not sleep well.  Nursing notes reflect that that she also had mentioned that there were 2 babies in a garbage can on the unit.  This morning she still trying to get discharged, but still psychotic.  She stated she does not believe that she is not pregnant.  She believes that she is pregnant, and that she has twins.  She is unable to explain why anyone would be lying to her about this.  I tried to explain to the patient that because she did not take her oral medications last night, and as well did not sleep well I did not feel comfortable returning her home.  This especially given her history of noncompliance.  She became again agitated and upset.  I haloperidol order was written for now.  She received her Abilify long-acting injectable medication on 11/30/2019.  Because her behavior this morning we also started oral haloperidol.  The nursing notes in epic reflect that she slept "7 hours", but nursing staff had passed on to daytime nursing staff that she had not slept at all.  Patient did admit that she had not slept well.  Also during our conversation during her irritability she also stated "there is  nothing wrong with me I am not going to take these medicines anyway".  Her blood pressure this morning is mildly elevated at 144/87.  She has normal heart rate and she is afebrile.  Sleep is as per above.  Review of her laboratories from 1/2 showed a mildly low potassium at 3.2, mildly elevated ALT at 45.  Her CBC showed an elevated white blood cell count at 14.3.  Drug screen was completely negative.  Pregnancy test was negative.  Blood alcohol was less than 10.  TSH was 0.527.  She denied auditory or visual hallucinations.  She denied any suicidal or homicidal ideation.  She remains significantly paranoid and delusional.   Principal Problem: Schizophrenia, paranoid (HCC) Diagnosis: Principal Problem:   Schizophrenia, paranoid (HCC) Active Problems:   Noncompliance with medications   Schizophrenia, paranoid type (HCC)   Delusion (HCC)  Total Time spent with patient: 20 minutes  Past Psychiatric History: See admission H&P  Past Medical History:  Past Medical History:  Diagnosis Date  . Anxiety   . Asthma   . Depression   . Schizophrenia [facility])     Past Surgical History:  Procedure Laterality Date  . CESAREAN SECTION    . EUSTACHIAN TUBE DILATION    . HERNIA REPAIR     Family History:  Family History  Problem Relation Age of Onset  . Bladder Cancer Neg Hx   . Kidney cancer Neg Hx    Family Psychiatric  History: See admission H&P Social History:  Social History   Substance and Sexual Activity  Alcohol Use Yes   Comment: occasionallly     Social History   Substance and Sexual Activity  Drug Use No    Social History   Socioeconomic History  . Marital status: Single    Spouse name: Not on file  . Number of children: Not on file  . Years of education: Not on file  . Highest education level: Not on file  Occupational History  . Not on file  Tobacco Use  . Smoking status: Current Every Day Smoker    Packs/day: 0.25    Types: Cigarettes  . Smokeless tobacco: Never  Used  Substance and Sexual Activity  . Alcohol use: Yes    Comment: occasionallly  . Drug use: No  . Sexual activity: Yes    Birth control/protection: None  Other Topics Concern  . Not on file  Social History Narrative  . Not on file   Social Determinants of Health   Financial Resource Strain:   . Difficulty of Paying Living Expenses: Not on file  Food Insecurity:   . Worried About Programme researcher, broadcasting/film/video in the Last Year: Not on file  . Ran Out of Food in the Last Year: Not on file  Transportation Needs:   . Lack of Transportation (Medical): Not on file  . Lack of Transportation (Non-Medical): Not on file  Physical Activity:   . Days of Exercise per Week: Not on file  . Minutes of Exercise per Session: Not on file  Stress:   . Feeling of Stress : Not on file  Social Connections:   . Frequency of Communication with Friends and Family: Not on file  . Frequency of Social Gatherings with Friends and Family: Not on file  . Attends Religious Services: Not on file  . Active Member of Clubs or Organizations: Not on file  . Attends Banker Meetings: Not on file  . Marital Status: Not on file   Additional Social History:                         Sleep: Poor  Appetite:  Fair  Current Medications: Current Facility-Administered Medications  Medication Dose Route Frequency Provider Last Rate Last Admin  . acetaminophen (TYLENOL) tablet 650 mg  650 mg Oral Q6H PRN Charm Rings, NP      . alum & mag hydroxide-simeth (MAALOX/MYLANTA) 200-200-20 MG/5ML suspension 30 mL  30 mL Oral Q4H PRN Charm Rings, NP      . ARIPiprazole (ABILIFY) tablet 20 mg  20 mg Oral Daily Mariel Craft, MD   20 mg at 11/29/19 6734  . ARIPiprazole ER (ABILIFY MAINTENA) injection 400 mg  400 mg Intramuscular Q28 days Mariel Craft, MD   400 mg at 11/29/19 1403  . benztropine (COGENTIN) tablet 2 mg  2 mg Oral BID PRN Mariel Craft, MD      . diphenhydrAMINE (BENADRYL) capsule  50 mg  50 mg Oral Q6H PRN Money, Gerlene Burdock, FNP       Or  . diphenhydrAMINE (BENADRYL) injection 50 mg  50 mg Intramuscular Q6H PRN Money, Feliz Beam B, FNP      . haloperidol (HALDOL) tablet 5 mg  5 mg Oral Q6H PRN Money, Feliz Beam B, FNP       Or  . haloperidol lactate (HALDOL) injection 5 mg  5 mg Intramuscular Q6H PRN Money, Gerlene Burdock, FNP      .  LORazepam (ATIVAN) tablet 2 mg  2 mg Oral Q6H PRN Money, Lowry Ram, FNP       Or  . LORazepam (ATIVAN) injection 2 mg  2 mg Intramuscular Q6H PRN Money, Darnelle Maffucci B, FNP      . magnesium hydroxide (MILK OF MAGNESIA) suspension 30 mL  30 mL Oral Daily PRN Patrecia Pour, NP      . nicotine (NICODERM CQ - dosed in mg/24 hours) patch 21 mg  21 mg Transdermal Daily Lavella Hammock, MD   21 mg at 11/30/19 1256    Lab Results: No results found for this or any previous visit (from the past 48 hour(s)).  Blood Alcohol level:  Lab Results  Component Value Date   ETH <10 11/28/2019   ETH <10 42/68/3419    Metabolic Disorder Labs: Lab Results  Component Value Date   HGBA1C 5.1 11/28/2019   MPG 99.67 11/28/2019   MPG 93.93 03/27/2018   Lab Results  Component Value Date   PROLACTIN 53.4 (H) 03/28/2017   Lab Results  Component Value Date   CHOL 148 11/28/2019   TRIG 42 11/28/2019   HDL 33 (L) 11/28/2019   CHOLHDL 4.5 11/28/2019   VLDL 8 11/28/2019   LDLCALC 107 (H) 11/28/2019   LDLCALC 91 03/27/2018    Physical Findings: AIMS:  , ,  ,  ,    CIWA:    COWS:     Musculoskeletal: Strength & Muscle Tone: within normal limits Gait & Station: normal Patient leans: N/A  Psychiatric Specialty Exam: Physical Exam  Nursing note and vitals reviewed. Constitutional: She is oriented to person, place, and time. She appears well-developed and well-nourished.  HENT:  Head: Normocephalic and atraumatic.  Respiratory: Effort normal.  Neurological: She is alert and oriented to person, place, and time.    Review of Systems  Blood pressure (!) 144/87,  pulse 88, temperature 98.7 F (37.1 C), temperature source Oral, resp. rate 17, height 5' 5.75" (1.67 m), weight 116.1 kg, SpO2 100 %.Body mass index is 41.64 kg/m.  General Appearance: Disheveled  Eye Contact:  Good  Speech:  Pressured  Volume:  Increased  Mood:  Dysphoric and Irritable  Affect:  Congruent  Thought Process:  Goal Directed and Descriptions of Associations: Loose  Orientation:  Full (Time, Place, and Person)  Thought Content:  Illogical, Delusions and Paranoid Ideation  Suicidal Thoughts:  No  Homicidal Thoughts:  No  Memory:  Immediate;   Poor Recent;   Poor Remote;   Poor  Judgement:  Impaired  Insight:  Lacking  Psychomotor Activity:  Increased  Concentration:  Concentration: Fair and Attention Span: Fair  Recall:  Poor  Fund of Knowledge:  Fair  Language:  Good  Akathisia:  Negative  Handed:  Right  AIMS (if indicated):     Assets:  Desire for Improvement Resilience  ADL's:  Intact  Cognition:  WNL  Sleep:  Number of Hours: 7     Treatment Plan Summary: Daily contact with patient to assess and evaluate symptoms and progress in treatment, Medication management and Plan : Patient is seen and examined.  Patient is a 24 year old female with the above-stated past psychiatric history who is seen in follow-up.   Diagnosis: #1 schizophrenia  Patient is seen in follow-up.  I just do not feel comfortable sending her home when she is not sleeping, and her delusion may be near baseline, but I think we have to try something else.  The Abilify long-acting injection is  still beginning to kick in, and she is not really taken any oral medicine to help accelerate this process.  Because of her increased agitation this morning Mr. Liliane Shi is written for some haloperidol both IM or oral because of agitation.  She does have Abilify 20 mg p.o. daily written, and I am going to change that to at bedtime and increase it up to 30 mg.  We may be able to reduce the dose if she takes it  and she does little bit better.  It may be that we need to add another medication if the Abilify becomes ineffective. 1.  Change Abilify to 30 mg p.o. nightly.  This is for psychosis. 2.  Patient received the Abilify long-acting injection on 1/3, and may need some more time to kick in for her psychosis. 3.  She has Haldol 5 mg and Ativan 2 mg as needed every 6 hours for agitation. 4.  Continue Cogentin 2 mg p.o. twice daily as needed tremors. 5.  Disposition planning-in progress.  Antonieta Pert, MD 12/01/2019, 11:28 AM

## 2019-12-01 NOTE — [facility] Suicide Risk Assessment (Addendum)
[facility] INPATIENT:  Family/Significant Other Suicide Prevention Education  Suicide Prevention Education:  Contact Attempts: Tracy Poole, mother, [phone]  has been identified by the patient as the family member/significant other with whom the patient will be residing, and identified as the person(s) who will aid the patient in the event of a mental health crisis.  With written consent from the patient, two attempts were made to provide suicide prevention education, prior to and/or following the patient's discharge.  We were unsuccessful in providing suicide prevention education.  A suicide education pamphlet was given to the patient to share with family/significant other.  Date and time of first attempt: 11/28/19   Date and time of second attempt:  12/01/2019 at 3:00PM  Tracy Poole 12/01/2019, 3:02 PM

## 2019-12-01 NOTE — Tx Team (Signed)
Interdisciplinary Treatment and Diagnostic Plan Update  12/01/2019 Time of Session: 900am Tracy Poole MRN: [id number]  Principal Diagnosis: Schizophrenia, paranoid (HCC)  Secondary Diagnoses: Principal Problem:   Schizophrenia, paranoid (HCC) Active Problems:   Noncompliance with medications   Schizophrenia, paranoid type (HCC)   Delusion (HCC)   Current Medications:  Current Facility-Administered Medications  Medication Dose Route Frequency Provider Last Rate Last Admin  . acetaminophen (TYLENOL) tablet 650 mg  650 mg Oral Q6H PRN Charm Rings, NP      . alum & mag hydroxide-simeth (MAALOX/MYLANTA) 200-200-20 MG/5ML suspension 30 mL  30 mL Oral Q4H PRN Charm Rings, NP      . ARIPiprazole (ABILIFY) tablet 20 mg  20 mg Oral Daily Mariel Craft, MD   20 mg at 11/29/19 6269  . ARIPiprazole ER (ABILIFY MAINTENA) injection 400 mg  400 mg Intramuscular Q28 days Mariel Craft, MD   400 mg at 11/29/19 1403  . benztropine (COGENTIN) tablet 2 mg  2 mg Oral BID PRN Mariel Craft, MD      . magnesium hydroxide (MILK OF MAGNESIA) suspension 30 mL  30 mL Oral Daily PRN Charm Rings, NP      . nicotine (NICODERM CQ - dosed in mg/24 hours) patch 21 mg  21 mg Transdermal Daily Mariel Craft, MD   21 mg at 11/30/19 1256   PTA Medications: Medications Prior to Admission  Medication Sig Dispense Refill Last Dose  . ABILIFY MAINTENA 400 MG PRSY prefilled syringe SMARTSIG:1 Syringe(s) IM Every 4 Weeks   unknown at unknown    Patient Stressors: Paediatric nurse issue Marital or family conflict  Patient Strengths: Ability for insight Average or above average intelligence Capable of independent living  Treatment Modalities: Medication Management, Group therapy, Case management,  1 to 1 session with clinician, Psychoeducation, Recreational therapy.   Physician Treatment Plan for Primary Diagnosis: Schizophrenia, paranoid (HCC) Long Term Goal(s): Improvement  in symptoms so as ready for discharge Improvement in symptoms so as ready for discharge   Short Term Goals: Ability to identify changes in lifestyle to reduce recurrence of condition will improve Ability to verbalize feelings will improve Ability to disclose and discuss suicidal ideas Ability to demonstrate self-control will improve Ability to identify and develop effective coping behaviors will improve Ability to maintain clinical measurements within normal limits will improve Compliance with prescribed medications will improve Ability to identify changes in lifestyle to reduce recurrence of condition will improve Ability to verbalize feelings will improve Ability to identify and develop effective coping behaviors will improve Ability to maintain clinical measurements within normal limits will improve  Medication Management: Evaluate patient's response, side effects, and tolerance of medication regimen.  Therapeutic Interventions: 1 to 1 sessions, Unit Group sessions and Medication administration.  Evaluation of Outcomes: Progressing  Physician Treatment Plan for Secondary Diagnosis: Principal Problem:   Schizophrenia, paranoid (HCC) Active Problems:   Noncompliance with medications   Schizophrenia, paranoid type (HCC)   Delusion (HCC)  Long Term Goal(s): Improvement in symptoms so as ready for discharge Improvement in symptoms so as ready for discharge   Short Term Goals: Ability to identify changes in lifestyle to reduce recurrence of condition will improve Ability to verbalize feelings will improve Ability to disclose and discuss suicidal ideas Ability to demonstrate self-control will improve Ability to identify and develop effective coping behaviors will improve Ability to maintain clinical measurements within normal limits will improve Compliance with prescribed medications will improve Ability to  identify changes in lifestyle to reduce recurrence of condition will  improve Ability to verbalize feelings will improve Ability to identify and develop effective coping behaviors will improve Ability to maintain clinical measurements within normal limits will improve     Medication Management: Evaluate patient's response, side effects, and tolerance of medication regimen.  Therapeutic Interventions: 1 to 1 sessions, Unit Group sessions and Medication administration.  Evaluation of Outcomes: Progressing   RN Treatment Plan for Primary Diagnosis: Schizophrenia, paranoid ([place]) Long Term Goal(s): Knowledge of disease and therapeutic regimen to maintain health will improve  Short Term Goals: Ability to verbalize frustration and anger appropriately will improve, Ability to demonstrate self-control, Ability to participate in decision making will improve and Ability to identify and develop effective coping behaviors will improve  Medication Management: RN will administer medications as ordered by provider, will assess and evaluate patient's response and provide education to patient for prescribed medication. RN will report any adverse and/or side effects to prescribing provider.  Therapeutic Interventions: 1 on 1 counseling sessions, Psychoeducation, Medication administration, Evaluate responses to treatment, Monitor vital signs and CBGs as ordered, Perform/monitor CIWA, COWS, AIMS and Fall Risk screenings as ordered, Perform wound care treatments as ordered.  Evaluation of Outcomes: Progressing   LCSW Treatment Plan for Primary Diagnosis: Schizophrenia, paranoid ([place]) Long Term Goal(s): Safe transition to appropriate next level of care at discharge, Engage patient in therapeutic group addressing interpersonal concerns.  Short Term Goals: Engage patient in aftercare planning with referrals and resources, Increase ability to appropriately verbalize feelings and Increase skills for wellness and recovery  Therapeutic Interventions: Assess for all discharge needs, 1  to 1 time with Social worker, Explore available resources and support systems, Assess for adequacy in community support network, Educate family and significant other(s) on suicide prevention, Complete Psychosocial Assessment, Interpersonal group therapy.  Evaluation of Outcomes: Progressing   Progress in Treatment: Attending groups: Yes. Participating in groups: No. Taking medication as prescribed: Yes. Toleration medication: Yes. Family/Significant other contact made: No, will contact:  pts mother Patient understands diagnosis: Yes. Discussing patient identified problems/goals with staff: Yes. Medical problems stabilized or resolved: Yes. Denies suicidal/homicidal ideation: Yes. Issues/concerns per patient self-inventory: No. Other: N/A  New problem(s) identified: No, Describe:  none  New Short Term/Long Term Goal(s): Detox, elimination of AVH/symptoms of psychosis, medication management for mood stabilization; elimination of SI thoughts; development of comprehensive mental wellness/sobriety plan.   Patient Goals:  "Tp never come back, me staying out the way and stop doing crazy stuff when I get angry""  Discharge Plan or Barriers: SPE pamphlet, Mobile Crisis information, and AA/NA information provided to patient for additional community support and resources. Pt agreeable to referral to [place].  Reason for Continuation of Hospitalization: Medication stabilization  Estimated Length of Stay: 2-3 days  Attendees: Patient: Tracy Poole 12/01/2019 10:48 AM  Physician: Dr Meredith Pel MD 12/01/2019 10:48 AM  Nursing: Lyda Kalata RN 12/01/2019 10:48 AM  RN Care Manager: 12/01/2019 10:48 AM  Social Worker: Minette Brine Teron Blais LCSW 12/01/2019 10:48 AM  Recreational Therapist: Roanna Epley CTRS LRT 12/01/2019 10:48 AM  Other: Sanjuana Kava LCSW  12/01/2019 10:48 AM  Other: Assunta Curtis LCSW 12/01/2019 10:48 AM  Other: 12/01/2019 10:48 AM    Scribe for Treatment Team: Mariann Laster Autrey Human,  LCSW 12/01/2019 10:48 AM

## 2019-12-01 NOTE — Progress Notes (Signed)
Recreation Therapy Notes  INPATIENT RECREATION THERAPY ASSESSMENT  Patient Details Name: Yvana Samonte MRN: [id number] DOB: 26-May-1996 Today's Date: 12/01/2019       Information Obtained From: Patient  Able to Participate in Assessment/Interview: Yes  Patient Presentation: Responsive  Reason for Admission (Per Patient): Active Symptoms  Patient Stressors:    Coping Skills:   Music  Leisure Interests (2+):  Sports - Dance, Music - Singing, Games - Video games  Frequency of Recreation/Participation: Pharmacist, community Resources:     [employer]:     Current Use:    If no, Barriers?:    Expressed Interest in [employer] Information:    [employer] of Residence:  Film/video editor  Patient Main Form of Transportation: Set designer  Patient Strengths:  N/A  Patient Identified Areas of Improvement:  To get out of here  Patient Goal for Hospitalization:  To go home  Current SI (including self-harm):  No  Current HI:  No  Current AVH: No  Staff Intervention Plan: Collaborate with Interdisciplinary Treatment Team, Group Attendance  Consent to Intern Participation: N/A  Kordel Leavy 12/01/2019, 2:19 PM

## 2019-12-01 NOTE — Progress Notes (Signed)
Patient presented to the nurses station, reporting that she wanted to take her medications. Patient received medications but did not swallow them. Patient spit the medications out in a cup, and was resistant when asked to show what's in the cup. Patient wanted the nurse to document that she took all the medications. Patient came to the nurses station and insisted"please write down that I took my medications.Marland KitchenMarland KitchenMarland KitchenI need to go home and see my baby...".  Patient has been very anxious and tearful, mostly after using the phone. Continues to insist that it should be documented that she took medications.

## 2019-12-01 NOTE — Plan of Care (Signed)
Patient continues to experience anxiety and restlessness, paranoia. Resistant to care. Has manipulative behaviors. Patient refused all medications. Remains delusional: Thinks she is pregnant with twins. Asked to be transferred to ED to deliver her babies.  Continues ask staff to document that she is taking medications so that she can be discharged.

## 2019-12-01 NOTE — Progress Notes (Signed)
Patient came to my office and was talking to me through the window and started yelling and screaming and stated that her mother was calling me.  Patient's mother did contact me and informed her that the patient was not discharging today.  Patient became extremely agitated started yelling and screaming more and then punched the window.  Notified the nursing staff about the situation and asked them to provide her with some of the as needed medications for agitation

## 2019-12-01 NOTE — Progress Notes (Signed)
Patient just came to the nurses station and stated "can you please tell him to send me home". This Clinical research associate informed patient that the message would be relayed to the MD, but the decision is entirely up to him. Patient stated "could you please tell him, you're safe with me".

## 2019-12-01 NOTE — Progress Notes (Signed)
Patient is fixated on being pregnant and feeling that she is having a miscarriage. Patient was crying and stopped this writer in the hallway to ask if the doctor was going to send her upstairs. This Clinical research associate stated to patient that there is nothing that shows that she is pregnant, so the doctor is not going to send her upstairs. Patient stated to this writer "I'm going home tomorrow, y'all can't keep me here", and walked off and went back into the dayroom.

## 2019-12-01 NOTE — [facility] Suicide Risk Assessment (Signed)
[facility] INPATIENT:  Family/Significant Other Suicide Prevention Education  Suicide Prevention Education:  Education Completed; Tracy Poole, mother, [phone] has been identified by the patient as the family member/significant other with whom the patient will be residing, and identified as the person(s) who will aid the patient in the event of a mental health crisis (suicidal ideations/suicide attempt).  With written consent from the patient, the family member/significant other has been provided the following suicide prevention education, prior to the and/or following the discharge of the patient.  The suicide prevention education provided includes the following:  Suicide risk factors  Suicide prevention and interventions  National Suicide Hotline telephone number  [facility] assessment telephone number  [facility] Emergency Assistance 911  [facility] and/or Residential Mobile Crisis Unit telephone number  Request made of family/significant other to:  Remove weapons (e.g., guns, rifles, knives), all items previously/currently identified as safety concern.    Remove drugs/medications (over-the-counter, prescriptions, illicit drugs), all items previously/currently identified as a safety concern.  The family member/significant other verbalizes understanding of the suicide prevention education information provided.  The family member/significant other agrees to remove the items of safety concern listed above.  Mother reports that the patient has not been on her medications "for 7 or 8 months".  She reports that this is the second time that the patient has attempted to fight her when agitated.  She reports that the patient can return home when discharged.     Harden Mo 12/01/2019, 3:04 PM

## 2019-12-01 NOTE — Plan of Care (Signed)
Stayed in the milieu and used the phone multiple times. Became anxious over a phone call but calmed herself down. No major distress.

## 2019-12-02 MED ORDER — HALOPERIDOL 5 MG PO TABS
5.0000 mg | ORAL_TABLET | Freq: Two times a day (BID) | ORAL | Status: DC
  Administered 2019-12-02 – 2019-12-03 (×2): 5 mg via ORAL
  Filled 2019-12-02 (×2): qty 1

## 2019-12-02 MED ORDER — HALOPERIDOL DECANOATE 100 MG/ML IM SOLN
50.0000 mg | Freq: Once | INTRAMUSCULAR | Status: AC
  Administered 2019-12-02: 50 mg via INTRAMUSCULAR
  Filled 2019-12-02: qty 0.5

## 2019-12-02 NOTE — Progress Notes (Signed)
Patient had some sleep. Currently out of bed, pacing and continues to request to be discharged.

## 2019-12-02 NOTE — Progress Notes (Signed)
D: Schizophrenia  A: Patient constantly at nursing station door . Continue to voice of wanting to go home . Patient observed   Pushing on door to  Exit. Voice of her mother wanting her home . Other patient on unit have come forth  With  compliants of  Patient threaten them with death .   Voice to writer to get her belongings together  Because her mother  Is coming to get her . Patient on phone  Crying  Out loud.  Pacing halls of unit .    Voice  To writer that there was nothing wrong  with her  And she doesn't need to be  Here.   Patient stated slept good last night .Stated appetite good and energy level  normal. Stated concentration is good . Stated on Depression scale 0 , hopeless 0 and anxiety 0 .( low 0-10 high) Denies suicidal  homicidal ideations  Denies auditory hallucinations  No pain concerns . Appropriate ADL'S. Limited  Interacting with peers and staff.   Encourage patient participation with unit programming . Instruction  Given on  Medication , verbalize understanding.  R: Voice no other concerns. Staff continue to monitor

## 2019-12-02 NOTE — Progress Notes (Signed)
Recreation Therapy Notes  Date: 12/02/2019  Time: 9:30 am  Location: Craft room  Behavioral response: Appropriate  Intervention Topic: Stress Management   Discussion/Intervention:  Group content on today was focused on stress. The group defined stress and way to cope with stress. Participants expressed how they know when they are stresses out. Individuals described the different ways they have to cope with stress. The group stated reasons why it is important to cope with stress. Patient explained what good stress is and some examples. The group participated in the intervention "Stress Management". Individuals were able to answer questions related to stress.  Clinical Observations/Feedback:  Patient came to group and stood in the door way. She was invited to join group, patient was hesitant but encouragement and redirection was given by the Clinical research associate.  Individual participated in the intervention during group. Jordyn Doane LRT/CTRS         Kameren Baade 12/02/2019 12:11 PM

## 2019-12-02 NOTE — [facility] Group Notes (Signed)
LCSW Group Therapy Note  12/02/2019 11:32 AM  Type of Therapy/Topic:  Group Therapy:  Emotion Regulation  Participation Level:  None   Description of Group:   The purpose of this group is to assist patients in learning to regulate negative emotions and experience positive emotions. Patients will be guided to discuss ways in which they have been vulnerable to their negative emotions. These vulnerabilities will be juxtaposed with experiences of positive emotions or situations, and patients will be challenged to use positive emotions to combat negative ones. Special emphasis will be placed on coping with negative emotions in conflict situations, and patients will process healthy conflict resolution skills.  Therapeutic Goals: 1. Patient will identify two positive emotions or experiences to reflect on in order to balance out negative emotions 2. Patient will label two or more emotions that they find the most difficult to experience 3. Patient will demonstrate positive conflict resolution skills through discussion and/or role plays  Summary of Patient Progress: Pt was present in group, but was sleeping.   Therapeutic Modalities:   Cognitive Behavioral Therapy Feelings Identification Dialectical Behavioral Therapy   Iris Pert, MSW, LCSW Clinical Social Work 12/02/2019 11:32 AM

## 2019-12-02 NOTE — Progress Notes (Signed)
[facility] MD Progress Note  12/02/2019 11:53 AM Tracy Poole  MRN:  [id number] Subjective:  Patient is a 24 year old female with a past psychiatric history significant for schizophrenia who presented to the [facility] emergency department on 11/27/2019 secondary to bizarre behavior.  The patient was reportedly hallucinating, noncompliant with her medications, and believe that her mother had put a spell on her.  Objective: Patient is seen and examined.  Patient is a 24 year old female with the above-stated past psychiatric history who is seen in follow-up.  She remains significantly psychotic.  She was trying to take her medications last night.  She remains paranoid.  She continues to believe that she is pregnant.  I asked her whether or not she had had a miscarriage last night, and she stated no but she needed to go directly to the emergency room to deliver her twins.  We also found out today that her paranoia led her to threaten to other patients.  The patients were being discharged today, and they told the staff that the patient had said that "I will kill you if you go on my room again".  We discussed the case, and clearly the Abilify is not fully effective with control of her schizophrenia.  We discussed long-acting haloperidol, and we will give her an injection today.  She continues to knock on the door constantly, states that her "mother needs me to come home to take care of my baby".  She is overheard on the phone yelling at her mother and telling her to come get her to get her out of here.  She continues to reiterate that "there is nothing wrong with me, I do not need any medicine".  Her vital signs are stable, she is afebrile.  She did sleep 5 hours last night.  No new laboratories.  Principal Problem: Schizophrenia, paranoid ([place]) Diagnosis: Principal Problem:   Schizophrenia, paranoid ([place]) Active Problems:   Noncompliance with medications   Schizophrenia, paranoid type  ([place])   Delusion ([place])  Total Time spent with patient: 20 minutes  Past Psychiatric History: See admission H&P  Past Medical History:  Past Medical History:  Diagnosis Date  . Anxiety   . Asthma   . Depression   . Schizophrenia [facility])     Past Surgical History:  Procedure Laterality Date  . CESAREAN SECTION    . EUSTACHIAN TUBE DILATION    . HERNIA REPAIR     Family History:  Family History  Problem Relation Age of Onset  . Bladder Cancer Neg Hx   . Kidney cancer Neg Hx    Family Psychiatric  History: See admission H&P Social History:  Social History   Substance and Sexual Activity  Alcohol Use Yes   Comment: occasionallly     Social History   Substance and Sexual Activity  Drug Use No    Social History   Socioeconomic History  . Marital status: Single    Spouse name: Not on file  . Number of children: Not on file  . Years of education: Not on file  . Highest education level: Not on file  Occupational History  . Not on file  Tobacco Use  . Smoking status: Current Every Day Smoker    Packs/day: 0.25    Types: Cigarettes  . Smokeless tobacco: Never Used  Substance and Sexual Activity  . Alcohol use: Yes    Comment: occasionallly  . Drug use: No  . Sexual activity: Yes    Birth control/protection:  None  Other Topics Concern  . Not on file  Social History Narrative  . Not on file   Social Determinants of Health   Financial Resource Strain:   . Difficulty of Paying Living Expenses: Not on file  Food Insecurity:   . Worried About Programme researcher, broadcasting/film/video in the Last Year: Not on file  . Ran Out of Food in the Last Year: Not on file  Transportation Needs:   . Lack of Transportation (Medical): Not on file  . Lack of Transportation (Non-Medical): Not on file  Physical Activity:   . Days of Exercise per Week: Not on file  . Minutes of Exercise per Session: Not on file  Stress:   . Feeling of Stress : Not on file  Social Connections:   . Frequency of  Communication with Friends and Family: Not on file  . Frequency of Social Gatherings with Friends and Family: Not on file  . Attends Religious Services: Not on file  . Active Member of Clubs or Organizations: Not on file  . Attends Banker Meetings: Not on file  . Marital Status: Not on file   Additional Social History:                         Sleep: Fair  Appetite:  Fair  Current Medications: Current Facility-Administered Medications  Medication Dose Route Frequency Provider Last Rate Last Admin  . acetaminophen (TYLENOL) tablet 650 mg  650 mg Oral Q6H PRN Charm Rings, NP      . alum & mag hydroxide-simeth (MAALOX/MYLANTA) 200-200-20 MG/5ML suspension 30 mL  30 mL Oral Q4H PRN Charm Rings, NP      . ARIPiprazole (ABILIFY) tablet 20 mg  20 mg Oral QHS Antonieta Pert, MD      . ARIPiprazole ER (ABILIFY MAINTENA) injection 400 mg  400 mg Intramuscular Q28 days Mariel Craft, MD   400 mg at 11/29/19 1403  . benztropine (COGENTIN) tablet 2 mg  2 mg Oral BID PRN Mariel Craft, MD      . diphenhydrAMINE (BENADRYL) capsule 50 mg  50 mg Oral Q6H PRN Money, Gerlene Burdock, FNP       Or  . diphenhydrAMINE (BENADRYL) injection 50 mg  50 mg Intramuscular Q6H PRN Money, Gerlene Burdock, FNP   50 mg at 12/01/19 1202  . haloperidol (HALDOL) tablet 5 mg  5 mg Oral Q6H PRN Money, Gerlene Burdock, FNP       Or  . haloperidol lactate (HALDOL) injection 5 mg  5 mg Intramuscular Q6H PRN Money, Gerlene Burdock, FNP   5 mg at 12/01/19 1202  . LORazepam (ATIVAN) tablet 2 mg  2 mg Oral Q6H PRN Money, Gerlene Burdock, FNP       Or  . LORazepam (ATIVAN) injection 2 mg  2 mg Intramuscular Q6H PRN Money, Gerlene Burdock, FNP   2 mg at 12/01/19 1202  . magnesium hydroxide (MILK OF MAGNESIA) suspension 30 mL  30 mL Oral Daily PRN Charm Rings, NP      . nicotine (NICODERM CQ - dosed in mg/24 hours) patch 21 mg  21 mg Transdermal Daily Mariel Craft, MD   21 mg at 12/01/19 1750  . traZODone (DESYREL) tablet  50 mg  50 mg Oral QHS PRN Antonieta Pert, MD      . ziprasidone (GEODON) injection 20 mg  20 mg Intramuscular Q6H PRN Antonieta Pert, MD  Lab Results: No results found for this or any previous visit (from the past 48 hour(s)).  Blood Alcohol level:  Lab Results  Component Value Date   ETH <10 11/28/2019   ETH <10 09/07/2019    Metabolic Disorder Labs: Lab Results  Component Value Date   HGBA1C 5.1 11/28/2019   MPG 99.67 11/28/2019   MPG 93.93 03/27/2018   Lab Results  Component Value Date   PROLACTIN 53.4 (H) 03/28/2017   Lab Results  Component Value Date   CHOL 148 11/28/2019   TRIG 42 11/28/2019   HDL 33 (L) 11/28/2019   CHOLHDL 4.5 11/28/2019   VLDL 8 11/28/2019   LDLCALC 107 (H) 11/28/2019   LDLCALC 91 03/27/2018    Physical Findings: AIMS:  , ,  ,  ,    CIWA:    COWS:     Musculoskeletal: Strength & Muscle Tone: within normal limits Gait & Station: normal Patient leans: N/A  Psychiatric Specialty Exam: Physical Exam  Nursing note and vitals reviewed. Constitutional: She is oriented to person, place, and time. She appears well-developed and well-nourished.  HENT:  Head: Normocephalic and atraumatic.  Respiratory: Effort normal.  Neurological: She is alert and oriented to person, place, and time.    Review of Systems  Blood pressure 133/72, pulse (!) 101, temperature 98.7 F (37.1 C), temperature source Oral, resp. rate 18, height 5' 5.75" (1.67 m), weight 116.1 kg, SpO2 100 %.Body mass index is 41.64 kg/m.  General Appearance: Disheveled  Eye Contact:  Good  Speech:  Pressured  Volume:  Increased  Mood:  Anxious, Dysphoric and Irritable  Affect:  Labile  Thought Process:  Goal Directed and Descriptions of Associations: Loose  Orientation:  Full (Time, Place, and Person)  Thought Content:  Delusions, Ideas of Reference:   Paranoia Delusions, Paranoid Ideation and Rumination  Suicidal Thoughts:  No  Homicidal Thoughts:  No  Memory:   Immediate;   Poor Recent;   Poor Remote;   Poor  Judgement:  Impaired  Insight:  Lacking  Psychomotor Activity:  Increased  Concentration:  Concentration: Fair and Attention Span: Fair  Recall:  [employer] of Knowledge:  Fair  Language:  Good  Akathisia:  Negative  Handed:  Right  AIMS (if indicated):     Assets:  Desire for Improvement Housing Resilience Social Support  ADL's:  Intact  Cognition:  WNL  Sleep:  Number of Hours: 5     Treatment Plan Summary: Daily contact with patient to assess and evaluate symptoms and progress in treatment, Medication management and Plan : Patient is seen and examined.  Patient is a 24 year old female with the above-stated past psychiatric history is seen in follow-up.   Diagnosis: #1 schizophrenia  Patient is seen in follow-up.  She continues to be significantly psychotic.  She did receive the Abilify injection on 1/3.  That really has had no benefit.  She has been inconsistent with taking the oral Abilify 20 mg p.o. nightly.  We have decided to give her the Haldol Decanoate injection 50 mg IM today.  We will also order oral Haldol and stop the oral Abilify.  Hopefully this will have some benefit on her psychosis. 1.  Stop Abilify. 2.  Continue Cogentin 2 mg p.o. twice daily for side effects of medications. 3.  Start Haldol 5 mg p.o. twice daily for psychosis. 4.  Haldol decanoate 50 mg IM x1 today. 5.  Continue trazodone 50 mg p.o. nightly as needed insomnia. 6.  Continue  Geodon 20 mg IM every 6 hours as needed agitation. 7.  Agitation protocols if necessary. 8.  Disposition planning-in progress.  Antonieta Pert, MD 12/02/2019, 11:53 AM

## 2019-12-03 MED ORDER — TRAZODONE HCL 50 MG PO TABS: 50.0000 mg | ORAL_TABLET | Freq: Every evening | ORAL | 1 refills | Status: DC | PRN

## 2019-12-03 MED ORDER — ARIPIPRAZOLE ER 400 MG IM SRER: 400.0000 mg | INTRAMUSCULAR | 1 refills | Status: DC

## 2019-12-03 MED ORDER — HALOPERIDOL 5 MG PO TABS: 5.0000 mg | ORAL_TABLET | Freq: Two times a day (BID) | ORAL | 1 refills | Status: DC

## 2019-12-03 MED ORDER — BENZTROPINE MESYLATE 2 MG PO TABS: 2.0000 mg | ORAL_TABLET | Freq: Two times a day (BID) | ORAL | 1 refills | Status: DC | PRN
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
## 2019-12-03 NOTE — Progress Notes (Signed)
  [facility] Adult Case Management Discharge Plan :  Will you be returning to the same living situation after discharge:  Yes,  pt is returning to the home with her mother.  At discharge, do you have transportation home?: Yes,  pts mother will provide transportation.  Do you have the ability to pay for your medications: Yes,  [employer] [employer]  Release of information consent forms completed and in the chart;  Patient's signature needed at discharge.  Patient to Follow up at: Follow-up Information    [facility] Go on 12/07/2019.   Why: You have an appointment scheduled for 12/07/2019 at 9:30AM. Please take photo id, and discharge paperwork with you. Thank You! Contact information: [address] [place] [address] [phone]           Next level of care provider has access to [facility] Link:no  Safety Planning and Suicide Prevention discussed: Yes,  SPE completed with mother.   Have you used any form of tobacco in the last 30 days? (Cigarettes, Smokeless Tobacco, Cigars, and/or Pipes): Yes  Has patient been referred to the Quitline?: Patient refused referral  Patient has been referred for addiction treatment: Pt. refused referral  Harden Mo, LCSW 12/03/2019, 9:25 AM

## 2019-12-03 NOTE — Plan of Care (Signed)
Patient wasn't needy or intrusive with staff this evening.   Problem: Education: Goal: Emotional status will improve Outcome: Progressing

## 2019-12-03 NOTE — [facility] Suicide Risk Assessment (Signed)
[facility] Discharge Suicide Risk Assessment   Principal Problem: Schizophrenia, paranoid [facility]) Discharge Diagnoses: Principal Problem:   Schizophrenia, paranoid ([facility]) Active Problems:   Noncompliance with medications   Schizophrenia, paranoid type ([facility])   Delusion ([facility])   Total Time spent with patient: 20 minutes  Musculoskeletal: Strength & Muscle Tone: within normal limits Gait & Station: normal Patient leans: N/A  Psychiatric Specialty Exam: Review of Systems  All other systems reviewed and are negative.   Blood pressure 133/85, pulse (!) 103, temperature 98.6 F (37 C), temperature source Oral, resp. rate 17, height 5' 5.75" (1.67 m), weight 116.1 kg, SpO2 100 %.Body mass index is 41.64 kg/m.  General Appearance: Casual  Eye Contact::  Good  Speech:  Normal Rate409  Volume:  Normal  Mood:  Euthymic  Affect:  Congruent  Thought Process:  Coherent and Descriptions of Associations: Circumstantial  Orientation:  Full (Time, Place, and Person)  Thought Content:  Delusions  Suicidal Thoughts:  No  Homicidal Thoughts:  No  Memory:  Immediate;   Fair Recent;   Fair Remote;   Fair  Judgement:  Intact  Insight:  Fair  Psychomotor Activity:  Normal  Concentration:  Fair  Recall:  [employer] of Knowledge:Fair  Language: Good  Akathisia:  Negative  Handed:  Right  AIMS (if indicated):     Assets:  Desire for Improvement Housing Resilience  Sleep:  Number of Hours: 7  Cognition: WNL  ADL's:  Intact   Mental Status Per Nursing Assessment::   On Admission:  NA  Demographic Factors:  Low socioeconomic status and Unemployed  Loss Factors: Financial problems/change in socioeconomic status  Historical Factors: Impulsivity  Risk Reduction Factors:   Sense of responsibility to family, Living with another person, especially a relative and Positive social support  Continued Clinical Symptoms:  Schizophrenia:   Less than 84 years old Paranoid or undifferentiated  type  Cognitive Features That Contribute To Risk:  None    Suicide Risk:  Minimal: No identifiable suicidal ideation.  Patients presenting with no risk factors but with morbid ruminations; may be classified as minimal risk based on the severity of the depressive symptoms  Follow-up Information    [facility] Go on 12/07/2019.   Why: You have an appointment scheduled for 12/07/2019 at 9:30AM. Please take photo id, and discharge paperwork with you. Thank You! Contact information: [address] [place] [address] [phone]           Plan Of Care/Follow-up recommendations:  Activity:  ad lib  Antonieta Pert, MD 12/03/2019, 8:53 AM

## 2019-12-03 NOTE — Progress Notes (Signed)
Patient was pacing the unit upon arrival to the unit. Patient pleasant during assessment denying SI/HI/AVH, pain, anxiety and depression with this Clinical research associate. Patient stated she doesn't belong in here and should be able to go home. Patient given education. Patient compliant with medication administration per MD orders. Patient observed interacting appropriately with staff and peers on the unit. Patient given support and encouragement to be active in her treatment plan. Patient informed to let staff know if there are any issues or problems on the unit. Patient being monitored Q 15 minutes for safety per unit protocol. Patient remains safe on the unit.

## 2019-12-03 NOTE — Plan of Care (Signed)
  Problem: Education: Goal: Knowledge of [place] [facility] Education information/materials will improve Outcome: Adequate for Discharge Goal: Emotional status will improve Outcome: Adequate for Discharge Goal: Mental status will improve Outcome: Adequate for Discharge Goal: Verbalization of understanding the information provided will improve Outcome: Adequate for Discharge   Problem: Safety: Goal: Periods of time without injury will increase Outcome: Adequate for Discharge

## 2019-12-03 NOTE — Progress Notes (Signed)
D: Patient is aware of  Discharge this shift  A:Patient denies suicidal /homicidal ideations. Patient received all belongings brought in   No Storage medications. Writer reviewed Discharge Summary, Suicide Risk Assessment, and Transitional Record. Patient also received Prescriptions   from  MD. . Aware  Of follow up appointment .  R: Patient left unit with no questions  Escort with staff to mother

## 2019-12-03 NOTE — Progress Notes (Signed)
Recreation Therapy Notes  INPATIENT RECREATION TR PLAN  Patient Details Name: Dillon Mcreynolds MRN: [id number] DOB: [birth year] Today's Date: 12/03/2019  Rec Therapy Plan Is patient appropriate for Therapeutic Recreation?: Yes Treatment times per week: At least 3 Estimated Length of Stay: 5-7 days TR Treatment/Interventions: Group participation (Comment)  Discharge Criteria Pt will be discharged from therapy if:: Discharged Treatment plan/goals/alternatives discussed and agreed upon by:: Patient/family  Discharge Summary Short term goals set: Patient will engage in groups without prompting or encouragement from LRT x3 group sessions within 5 recreation therapy group sessions Short term goals met: Complete Progress toward goals comments: Groups attended Which groups?: Stress management, Other (Comment)(Problem Solving, Relaxation) Reason goals not met: N/A Therapeutic equipment acquired: N/A Reason patient discharged from therapy: Discharge from hospital Pt/family agrees with progress & goals achieved: Yes Date patient discharged from therapy: 12/03/19   Tra Wilemon 12/03/2019, 11:40 AM

## 2019-12-03 NOTE — Plan of Care (Signed)
  Problem: Group Participation Goal: STG - Patient will engage in groups without prompting or encouragement from LRT x3 group sessions within 5 recreation therapy group sessions Description: STG - Patient will engage in groups without prompting or encouragement from LRT x3 group sessions within 5 recreation therapy group sessions Outcome: Completed/Met

## 2019-12-03 NOTE — Progress Notes (Signed)
Recreation Therapy Notes  Date: 12/03/2019  Time: 9:30 am   Location: Craft room   Behavioral response: N/A   Intervention Topic: Coping Skills  Discussion/Intervention: Patient did not attend group.   Clinical Observations/Feedback:  Patient did not attend group.   Forest Pruden LRT/CTRS        Vivi Piccirilli 12/03/2019 11:31 AM 

## 2020-01-07 ENCOUNTER — Emergency Department
Admission: EM | Admit: 2020-01-07 | Discharge: 2020-01-08 | Disposition: A | Discharge status: Other Acute Inpatient Hospital | Payer: Medicaid Other | Attending: Emergency Medicine | Admitting: Emergency Medicine

## 2020-01-07 ENCOUNTER — Other Ambulatory Visit: Payer: Self-pay

## 2020-01-07 DIAGNOSIS — Z9119 Patient's noncompliance with other medical treatment and regimen: Secondary | ICD-10-CM | POA: Insufficient documentation

## 2020-01-07 DIAGNOSIS — R462 Strange and inexplicable behavior: Secondary | ICD-10-CM

## 2020-01-07 DIAGNOSIS — F17210 Nicotine dependence, cigarettes, uncomplicated: Secondary | ICD-10-CM | POA: Insufficient documentation

## 2020-01-07 DIAGNOSIS — F209 Schizophrenia, unspecified: Secondary | ICD-10-CM | POA: Diagnosis not present

## 2020-01-07 DIAGNOSIS — F319 Bipolar disorder, unspecified: Secondary | ICD-10-CM | POA: Insufficient documentation

## 2020-01-07 DIAGNOSIS — Z79899 Other long term (current) drug therapy: Secondary | ICD-10-CM | POA: Insufficient documentation

## 2020-01-07 DIAGNOSIS — R451 Restlessness and agitation: Secondary | ICD-10-CM | POA: Insufficient documentation

## 2020-01-07 DIAGNOSIS — F419 Anxiety disorder, unspecified: Secondary | ICD-10-CM | POA: Diagnosis not present

## 2020-01-07 DIAGNOSIS — Z20822 Contact with and (suspected) exposure to covid-19: Secondary | ICD-10-CM | POA: Diagnosis not present

## 2020-01-07 LAB — COMPREHENSIVE METABOLIC PANEL
ALT: 33 U/L (ref 0–44)
AST: 23 U/L (ref 15–41)
Albumin: 4.1 g/dL (ref 3.5–5.0)
Alkaline Phosphatase: 92 U/L (ref 38–126)
Anion gap: 8 (ref 5–15)
BUN: 7 mg/dL (ref 6–20)
CO2: 24 mmol/L (ref 22–32)
Calcium: 9.2 mg/dL (ref 8.9–10.3)
Chloride: 105 mmol/L (ref 98–111)
Creatinine, Ser: 0.71 mg/dL (ref 0.44–1.00)
GFR calc Af Amer: >60 mL/min (ref >60)
GFR calc non Af Amer: >60 mL/min (ref >60)
Glucose, Bld: 91 mg/dL (ref 70–99)
Potassium: 3.3 mmol/L — ABNORMAL LOW (ref 3.5–5.1)
Sodium: 137 mmol/L (ref 135–145)
Total Bilirubin: 1.3 mg/dL — ABNORMAL HIGH (ref 0.3–1.2)
Total Protein: 7.9 g/dL (ref 6.5–8.1)

## 2020-01-07 LAB — URINE DRUG SCREEN, QUALITATIVE (ARMC ONLY)
Amphetamines, Ur Screen: NOT DETECTED
Barbiturates, Ur Screen: NOT DETECTED
Benzodiazepine, Ur Scrn: NOT DETECTED
Cannabinoid 50 Ng, Ur ~~LOC~~: NOT DETECTED
Cocaine Metabolite,Ur ~~LOC~~: NOT DETECTED
MDMA (Ecstasy)Ur Screen: NOT DETECTED
Methadone Scn, Ur: NOT DETECTED
Opiate, Ur Screen: NOT DETECTED
Phencyclidine (PCP) Ur S: NOT DETECTED
Tricyclic, Ur Screen: NOT DETECTED

## 2020-01-07 LAB — CBC
HCT: 38.1 % (ref 36.0–46.0)
Hemoglobin: 12.8 g/dL (ref 12.0–15.0)
MCH: 27.9 pg (ref 26.0–34.0)
MCHC: 33.6 g/dL (ref 30.0–36.0)
MCV: 83 fL (ref 80.0–100.0)
Platelets: 236 10*3/uL (ref 150–400)
RBC: 4.59 MIL/uL (ref 3.87–5.11)
RDW: 14.3 % (ref 11.5–15.5)
WBC: 13 10*3/uL — ABNORMAL HIGH (ref 4.0–10.5)
nRBC: 0 % (ref 0.0–0.2)

## 2020-01-07 LAB — POCT PREGNANCY, URINE: Preg Test, Ur: NEGATIVE

## 2020-01-07 LAB — RESPIRATORY PANEL BY RT PCR (FLU A&B, COVID)
Influenza A by PCR: NEGATIVE
Influenza B by PCR: NEGATIVE
SARS Coronavirus 2 by RT PCR: NEGATIVE

## 2020-01-07 LAB — ACETAMINOPHEN LEVEL: Acetaminophen (Tylenol), Serum: <10 ug/mL — ABNORMAL LOW (ref 10–30)

## 2020-01-07 LAB — SALICYLATE LEVEL: Salicylate Lvl: <7 mg/dL — ABNORMAL LOW (ref 7.0–30.0)

## 2020-01-07 LAB — ETHANOL: Alcohol, Ethyl (B): <10 mg/dL (ref <10)

## 2020-01-07 NOTE — ED Notes (Signed)
Pt has a piercing above upper lip.  Pt unable to remove piercing(ball) above the lip.  States it's never been taken out before.
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
## 2020-01-07 NOTE — ED Provider Notes (Signed)
[facility] Emergency Department Provider Note    First MD Initiated Contact with Patient 01/07/20 1350     (approximate)  I have reviewed the triage vital signs and the nursing notes.   HISTORY  Chief Complaint IVC    HPI Tracy Poole is a 24 y.o. female bolus past medical history presents the ER under IVC due to mother taking up reports and that the patient is been off her medications becoming increasingly agitated having occasional violent outbursts and even threatening to take her 40-year-old child to move out.  Reporting increasing bizarre behavior stating that she is married when she is not.  Patient states that she is here in the ER for her "shot."  Will not provide any additional history.    Past Medical History:  Diagnosis Date  . Anxiety   . Asthma   . Depression   . Schizophrenia (HCC)    Family History  Problem Relation Age of Onset  . Bladder Cancer Neg Hx   . Kidney cancer Neg Hx    Past Surgical History:  Procedure Laterality Date  . CESAREAN SECTION    . EUSTACHIAN TUBE DILATION    . HERNIA REPAIR     Patient Active Problem List   Diagnosis Date Noted  . Delusion (HCC)   . Schizophrenia, paranoid type (HCC) 11/28/2019  . Evaluation by psychiatric service required   . Noncompliance with medications 03/25/2018  . Pregnancy 09/29/2017  . Pregnancy affected by fetal growth restriction 09/05/2017  . Abnormal findings on antenatal screening   . First trimester pregnancy 03/27/2017  . Schizophrenia, paranoid (HCC) 03/27/2017      Prior to Admission medications   Medication Sig Start Date End Date Taking? Authorizing Provider  ARIPiprazole ER (ABILIFY MAINTENA) 400 MG SRER injection Inject 2 mLs (400 mg total) into the muscle every 28 (twenty-eight) days. Next dose due 12-28-2019 12/27/19   Money, Gerlene Burdock, FNP  benztropine (COGENTIN) 2 MG tablet Take 1 tablet (2 mg total) by mouth 2 (two) times daily as needed for tremors  (spitting). 12/03/19   Money, Gerlene Burdock, FNP  haloperidol (HALDOL) 5 MG tablet Take 1 tablet (5 mg total) by mouth 2 (two) times daily. 12/03/19   Money, Gerlene Burdock, FNP  traZODone (DESYREL) 50 MG tablet Take 1 tablet (50 mg total) by mouth at bedtime as needed for sleep. 12/03/19   Money, Gerlene Burdock, FNP    Allergies Patient has no known allergies.    Social History Social History   Tobacco Use  . Smoking status: Current Every Day Smoker    Packs/day: 0.25    Types: Cigarettes  . Smokeless tobacco: Never Used  Substance Use Topics  . Alcohol use: Yes    Comment: occasionallly  . Drug use: No    Review of Systems Patient denies headaches, rhinorrhea, blurry vision, numbness, shortness of breath, chest pain, edema, cough, abdominal pain, nausea, vomiting, diarrhea, dysuria, fevers, rashes or hallucinations unless otherwise stated above in HPI. ____________________________________________   PHYSICAL EXAM:  VITAL SIGNS: Vitals:   01/07/20 1304  BP: (!) 126/93  Pulse: (!) 103  Resp: 19  Temp: 98.3 F (36.8 C)  SpO2: 100%    Constitutional: Alert and oriented.  Eyes: Conjunctivae are normal.  Head: Atraumatic. Nose: No congestion/rhinnorhea. Mouth/Throat: Mucous membranes are moist.   Neck: No stridor. Painless ROM.  Cardiovascular: Normal rate, regular rhythm. Grossly normal heart sounds.  Good peripheral circulation. Respiratory: Normal respiratory effort.  No retractions. Lungs  CTAB. Gastrointestinal: Soft and nontender. No distention. No abdominal bruits. No CVA tenderness. Genitourinary:  Musculoskeletal: No lower extremity tenderness nor edema.  No joint effusions. Neurologic:  Normal speech and language. No gross focal neurologic deficits are appreciated. No facial droop Skin:  Skin is warm, dry and intact. No rash noted. Psychiatric: Mood and affect are normal. Speech and behavior are normal.  ____________________________________________   LABS (all labs ordered  are listed, but only abnormal results are displayed)  Results for orders placed or performed during the hospital encounter of 01/07/20 (from the past 24 hour(s))  Comprehensive metabolic panel     Status: Abnormal   Collection Time: 01/07/20  1:06 PM  Result Value Ref Range   Sodium 137 135 - 145 mmol/L   Potassium 3.3 (L) 3.5 - 5.1 mmol/L   Chloride 105 98 - 111 mmol/L   CO2 24 22 - 32 mmol/L   Glucose, Bld 91 70 - 99 mg/dL   BUN 7 6 - 20 mg/dL   Creatinine, Ser 0.71 0.44 - 1.00 mg/dL   Calcium 9.2 8.9 - 10.3 mg/dL   Total Protein 7.9 6.5 - 8.1 g/dL   Albumin 4.1 3.5 - 5.0 g/dL   AST 23 15 - 41 U/L   ALT 33 0 - 44 U/L   Alkaline Phosphatase 92 38 - 126 U/L   Total Bilirubin 1.3 (H) 0.3 - 1.2 mg/dL   GFR calc non Af Amer >60 >60 mL/min   GFR calc Af Amer >60 >60 mL/min   Anion gap 8 5 - 15  Ethanol     Status: None   Collection Time: 01/07/20  1:06 PM  Result Value Ref Range   Alcohol, Ethyl (B) <88 <41 mg/dL  Salicylate level     Status: Abnormal   Collection Time: 01/07/20  1:06 PM  Result Value Ref Range   Salicylate Lvl <6.6 (L) 7.0 - 30.0 mg/dL  Acetaminophen level     Status: Abnormal   Collection Time: 01/07/20  1:06 PM  Result Value Ref Range   Acetaminophen (Tylenol), Serum <10 (L) 10 - 30 ug/mL  cbc     Status: Abnormal   Collection Time: 01/07/20  1:06 PM  Result Value Ref Range   WBC 13.0 (H) 4.0 - 10.5 K/uL   RBC 4.59 3.87 - 5.11 MIL/uL   Hemoglobin 12.8 12.0 - 15.0 g/dL   HCT 38.1 36.0 - 46.0 %   MCV 83.0 80.0 - 100.0 fL   MCH 27.9 26.0 - 34.0 pg   MCHC 33.6 30.0 - 36.0 g/dL   RDW 14.3 11.5 - 15.5 %   Platelets 236 150 - 400 K/uL   nRBC 0.0 0.0 - 0.2 %  Urine Drug Screen, Qualitative     Status: None   Collection Time: 01/07/20  1:10 PM  Result Value Ref Range   Tricyclic, Ur Screen NONE DETECTED NONE DETECTED   Amphetamines, Ur Screen NONE DETECTED NONE DETECTED   MDMA (Ecstasy)Ur Screen NONE DETECTED NONE DETECTED   Cocaine Metabolite,Ur [place] NONE  DETECTED NONE DETECTED   Opiate, Ur Screen NONE DETECTED NONE DETECTED   Phencyclidine (PCP) Ur S NONE DETECTED NONE DETECTED   Cannabinoid 50 Ng, Ur [place] NONE DETECTED NONE DETECTED   Barbiturates, Ur Screen NONE DETECTED NONE DETECTED   Benzodiazepine, Ur Scrn NONE DETECTED NONE DETECTED   Methadone Scn, Ur NONE DETECTED NONE DETECTED  Pregnancy, urine POC     Status: None   Collection Time: 01/07/20  1:23 PM  Result Value Ref Range   Preg Test, Ur NEGATIVE NEGATIVE   ____________________________________________  EKG My review and personal interpretation at Time: 14:24   Indication: med eval  Rate: 85  Rhythm: sinus Axis: normal Other: normal intervals, no stemi ____________________________________________  RADIOLOGY   ____________________________________________   PROCEDURES  Procedure(s) performed:  Procedures    Critical Care performed: no ____________________________________________   INITIAL IMPRESSION / ASSESSMENT AND PLAN / ED COURSE  Pertinent labs & imaging results that were available during my care of the patient were reviewed by me and considered in my medical decision making (see chart for details).   DDX: Psychosis, delirium, medication effect, noncompliance, polysubstance abuse, Si, Hi, depression   Tayler Joliana Claflin is a 24 y.o. who presents to the ED with for evaluation of bizarre behavior and reported violent outbursts.  Patient has psych history of schizophrenia and reportedly is noncompliant with meds..  Laboratory testing was ordered to evaluation for underlying electrolyte derangement or signs of underlying organic pathology to explain today's presentation.  Based on history and physical and laboratory evaluation, it appears that the patient's presentation is 2/2 underlying psychiatric disorder and will require further evaluation and management by inpatient psychiatry.  Patient was  made an IVC PTA.  Will continue.  Disposition pending psychiatric  evaluation.      The patient was evaluated in Emergency Department today for the symptoms described in the history of present illness. He/she was evaluated in the context of the global COVID-19 pandemic, which necessitated consideration that the patient might be at risk for infection with the SARS-CoV-2 virus that causes COVID-19. Institutional protocols and algorithms that pertain to the evaluation of patients at risk for COVID-19 are in a state of rapid change based on information released by regulatory bodies including the CDC and federal and state organizations. These policies and algorithms were followed during the patient's care in the ED.  As part of my medical decision making, I reviewed the following data within the electronic MEDICAL RECORD NUMBER Nursing notes reviewed and incorporated, Labs reviewed, notes from prior ED visits and [place] Controlled Substance Database   ____________________________________________   FINAL CLINICAL IMPRESSION(S) / ED DIAGNOSES  Final diagnoses:  Agitation  Bizarre behavior      NEW MEDICATIONS STARTED DURING THIS VISIT:  New Prescriptions   No medications on file     Note:  This document was prepared using Dragon voice recognition software and may include unintentional dictation errors.    Willy Eddy, MD 01/07/20 [phone]

## 2020-01-07 NOTE — ED Notes (Addendum)
Pt dressed out in hospital scrubs. Pt 's belongings to include: Tracy Poole handbag 2 black sandals 2 white socks 1 blue dress 1 white sweatshirt 1 green underwear

## 2020-01-07 NOTE — ED Notes (Signed)
Pt waiting to go downstairs.

## 2020-01-07 NOTE — ED Notes (Signed)
IVC PENDING  CONSULT ?

## 2020-01-07 NOTE — ED Notes (Signed)
IVC  CONSULT  DONE  PENDING  PLACEMENT 
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
## 2020-01-07 NOTE — ED Notes (Signed)
Repeat EKG done.

## 2020-01-07 NOTE — ED Notes (Signed)
Pt being moved to [facility].

## 2020-01-07 NOTE — ED Notes (Signed)
Unable to get pt's piercing out at this time. Will inform RN Amy

## 2020-01-07 NOTE — BH Assessment (Signed)
Assessment Note  Tracy Poole is an 24 y.o. female who presents to the ER via law enforcement after her mother petitioned for her to be under IVC. Per the patient, she was brought to the ER to get her monthly injection, because she hadn't followed up with her outpatient provider. She reports of having trouble with transportation and that's why she didn't attend the appointments. However, per the report of the patient's mother Tracy Poole), the patient is non-compliant with her medications. She's starting to talk to herself more and laughing at inappropriate times. When asked what is going on or who she's talking with, she denies she's talking. This morning (01/07/2020), patient left the home with her two year old child in the cold. She told her mother she was moving into her new apartment but she doesn't have anywhere to go. Mother states, her current behaviors are common when she's not doing well mentally. Mother also reports, if she doesn't get help now, the next step is for her to become violent and she's trying to prevent that from happened.  During the interview, the patient was able to provide appropriate answers to the questions. At times she became guarded and withdrawn. She stated she didn't want to answer certain questions because she's afraid she will have to stay and get admitted.   Diagnosis: Schizophrenia  Past Medical History:  Past Medical History:  Diagnosis Date  . Anxiety   . Asthma   . Depression   . Schizophrenia [facility])     Past Surgical History:  Procedure Laterality Date  . CESAREAN SECTION    . EUSTACHIAN TUBE DILATION    . HERNIA REPAIR      Family History:  Family History  Problem Relation Age of Onset  . Bladder Cancer Neg Hx   . Kidney cancer Neg Hx     Social History:  reports that she has been smoking cigarettes. She has been smoking about 0.25 packs per day. She has never used smokeless tobacco. She reports current alcohol use. She reports that she  does not use drugs.  Additional Social History:  Alcohol / Drug Use Pain Medications: See PTA Prescriptions: See PTA Over the Counter: See PTA History of alcohol / drug use?: No history of alcohol / drug abuse Longest period of sobriety (when/how long): n/a  CIWA: CIWA-Ar BP: 122/60 Pulse Rate: 94 COWS:    Allergies: No Known Allergies  Home Medications: (Not in a hospital admission)   OB/GYN Status:  No LMP recorded. (Menstrual status: Irregular Periods).  General Assessment Data Location of Assessment: [facility] ED TTS Assessment: In system Is this a Tele or Face-to-Face Assessment?: Face-to-Face Is this an Initial Assessment or a Re-assessment for this encounter?: Initial Assessment Patient Accompanied by:: N/A Language Other than English: No Living Arrangements: Other (Comment)(Private Home) What gender do you identify as?: Female Marital status: Single Pregnancy Status: No Living Arrangements: Children, Parent Can pt return to current living arrangement?: Yes Admission Status: Involuntary Petitioner: Other(Mother) Is patient capable of signing voluntary admission?: No(Under IVC) Referral Source: Self/Family/Friend Insurance type: Medicaid  Medical Screening Exam ([place]) Medical Exam completed: Yes  Crisis Care Plan Living Arrangements: Children, Parent Legal Guardian: Other:(Self) Name of Psychiatrist: Reports of none Name of Therapist: Reports of none  Education Status Is patient currently in school?: No Is the patient employed, unemployed or receiving disability?: Unemployed  Risk to self with the past 6 months Suicidal Ideation: No Has patient been a risk to self within the  past 6 months prior to admission? : No Suicidal Intent: No Has patient had any suicidal intent within the past 6 months prior to admission? : No Is patient at risk for suicide?: No Suicidal Plan?: No Has patient had any suicidal plan within the past 6 months prior to  admission? : No Access to Means: No What has been your use of drugs/alcohol within the last 12 months?: Reports of none Previous Attempts/Gestures: No How many times?: 0 Other Self Harm Risks: Reports of none Triggers for Past Attempts: None known Intentional Self Injurious Behavior: None Family Suicide History: No Recent stressful life event(s): Other (Comment)(Stop taking medications) Persecutory voices/beliefs?: No Depression: Yes Depression Symptoms: Isolating, Feeling worthless/self pity, Guilt Substance abuse history and/or treatment for substance abuse?: No Suicide prevention information given to non-admitted patients: Not applicable  Risk to Others within the past 6 months Homicidal Ideation: No Does patient have any lifetime risk of violence toward others beyond the six months prior to admission? : No Thoughts of Harm to Others: No Current Homicidal Intent: No Current Homicidal Plan: No Access to Homicidal Means: No Identified Victim: Reports of none History of harm to others?: No Assessment of Violence: None Noted Violent Behavior Description: Reports of none Does patient have access to weapons?: No Criminal Charges Pending?: No Does patient have a court date: No Is patient on probation?: No  Psychosis Hallucinations: Auditory(Per patient's mother) Delusions: None noted  Mental Status Report Appearance/Hygiene: Unremarkable, In scrubs Eye Contact: Good Motor Activity: Freedom of movement, Unremarkable Speech: Logical/coherent, Unremarkable Level of Consciousness: Alert Mood: Anxious, Sad, Pleasant Affect: Appropriate to circumstance, Sad Anxiety Level: Minimal Thought Processes: Coherent, Relevant Judgement: Partial Orientation: Person, Place, Time, Situation, Appropriate for developmental age Obsessive Compulsive Thoughts/Behaviors: Minimal  Cognitive Functioning Concentration: Normal Memory: Recent Intact, Remote Intact Is patient IDD: No Insight:  Fair Impulse Control: Fair Appetite: Good Have you had any weight changes? : No Change Sleep: No Change Total Hours of Sleep: 8 Vegetative Symptoms: None  ADLScreening [facility] Assessment Services) Patient's cognitive ability adequate to safely complete daily activities?: Yes Patient able to express need for assistance with ADLs?: Yes Independently performs ADLs?: Yes (appropriate for developmental age)  Prior Inpatient Therapy Prior Inpatient Therapy: Yes Prior Therapy Dates: 07/2019, 02/2018, 03/2017 Prior Therapy Facilty/Provider(s): [facility] Reason for Treatment: Schizophrenia  Prior Outpatient Therapy Prior Outpatient Therapy: No Does patient have an ACCT team?: No Does patient have Intensive In-House Services?  : No Does patient have [facility] services? : No Does patient have P4CC services?: No  ADL Screening (condition at time of admission) Patient's cognitive ability adequate to safely complete daily activities?: Yes Is the patient deaf or have difficulty hearing?: No Does the patient have difficulty seeing, even when wearing glasses/contacts?: No Does the patient have difficulty concentrating, remembering, or making decisions?: No Patient able to express need for assistance with ADLs?: Yes Does the patient have difficulty dressing or bathing?: No Independently performs ADLs?: Yes (appropriate for developmental age) Does the patient have difficulty walking or climbing stairs?: No Weakness of Legs: None Weakness of Arms/Hands: None  Home Assistive Devices/Equipment Home Assistive Devices/Equipment: None  Therapy Consults (therapy consults require a physician order) PT Evaluation Needed: No OT Evalulation Needed: No SLP Evaluation Needed: No Abuse/Neglect Assessment (Assessment to be complete while patient is alone) Abuse/Neglect Assessment Can Be Completed: Yes Physical Abuse: Denies Verbal Abuse: Denies Sexual Abuse: Denies Exploitation of  patient/patient's resources: Denies Self-Neglect: Denies Values / Beliefs Cultural Requests During Hospitalization:  None Spiritual Requests During Hospitalization: None Consults Spiritual Care Consult Needed: No Transition of Care Team Consult Needed: No Advance Directives (For Healthcare) Does Patient Have a Medical Advance Directive?: No  Child/Adolescent Assessment Running Away Risk: Denies(Patient is an adult)  Disposition:  Disposition Initial Assessment Completed for this Encounter: Yes  On Site Evaluation by:   Reviewed with Physician:    Lilyan Gilford MS, LCAS, [facility], N[facility] Therapeutic Triage Specialist 01/07/2020 7:52 PM

## 2020-01-08 ENCOUNTER — Encounter: Payer: Self-pay | Admitting: Behavioral Health

## 2020-01-08 ENCOUNTER — Inpatient Hospital Stay
Admission: EM | Admit: 2020-01-08 | Discharge: 2020-01-11 | DRG: 885 | Disposition: A | Discharge status: Home / Self Care | Payer: Medicaid Other | Source: Intra-hospital | Attending: Psychiatry | Admitting: Psychiatry

## 2020-01-08 DIAGNOSIS — F319 Bipolar disorder, unspecified: Secondary | ICD-10-CM | POA: Diagnosis present

## 2020-01-08 DIAGNOSIS — F200 Paranoid schizophrenia: Secondary | ICD-10-CM | POA: Diagnosis present

## 2020-01-08 DIAGNOSIS — F17210 Nicotine dependence, cigarettes, uncomplicated: Secondary | ICD-10-CM | POA: Diagnosis present

## 2020-01-08 DIAGNOSIS — J45909 Unspecified asthma, uncomplicated: Secondary | ICD-10-CM | POA: Diagnosis present

## 2020-01-08 DIAGNOSIS — Z79899 Other long term (current) drug therapy: Secondary | ICD-10-CM

## 2020-01-08 MED ORDER — HALOPERIDOL 5 MG PO TABS
5.0000 mg | ORAL_TABLET | Freq: Two times a day (BID) | ORAL | Status: DC
  Administered 2020-01-08 – 2020-01-11 (×7): 5 mg via ORAL
  Filled 2020-01-08 (×8): qty 1

## 2020-01-08 MED ORDER — BENZTROPINE MESYLATE 1 MG PO TABS: 2.0000 mg | ORAL_TABLET | Freq: Two times a day (BID) | ORAL | Status: DC | PRN

## 2020-01-08 MED ORDER — NICOTINE 21 MG/24HR TD PT24
21.0000 mg | MEDICATED_PATCH | Freq: Every day | TRANSDERMAL | Status: DC
  Administered 2020-01-08 – 2020-01-11 (×4): 21 mg via TRANSDERMAL
  Filled 2020-01-08 (×4): qty 1

## 2020-01-08 MED ORDER — ACETAMINOPHEN 325 MG PO TABS
650.0000 mg | ORAL_TABLET | Freq: Four times a day (QID) | ORAL | Status: DC | PRN
  Administered 2020-01-10: 650 mg via ORAL
  Filled 2020-01-08: qty 2

## 2020-01-08 MED ORDER — ALUM & MAG HYDROXIDE-SIMETH 200-200-20 MG/5ML PO SUSP: 30.0000 mL | ORAL | Status: DC | PRN

## 2020-01-08 MED ORDER — TRAZODONE HCL 50 MG PO TABS
50.0000 mg | ORAL_TABLET | Freq: Every evening | ORAL | Status: DC | PRN
  Administered 2020-01-08: 50 mg via ORAL
  Filled 2020-01-08: qty 1

## 2020-01-08 MED ORDER — ARIPIPRAZOLE ER 400 MG IM SRER
400.0000 mg | INTRAMUSCULAR | Status: DC
  Administered 2020-01-08: 400 mg via INTRAMUSCULAR
  Filled 2020-01-08 (×2): qty 2

## 2020-01-08 MED ORDER — MAGNESIUM HYDROXIDE 400 MG/5ML PO SUSP: 30.0000 mL | Freq: Every day | ORAL | Status: DC | PRN

## 2020-01-08 NOTE — Progress Notes (Signed)
Recreation Therapy Notes   Date: 01/08/2020  Time: 9:30 am  Location: Craft Room  Behavioral response: Appropriate   Intervention Topic: Communication   Discussion/Intervention:  Group content today was focused on communication. The group defined communication and ways to communicate with others. Individuals stated reason why communication is important and some reasons to communicate with others. Patients expressed if they thought they were good at communicating with others and ways they could improve their communication skills. The group identified important parts of communication and some experiences they have had in the past with communication. The group participated in the intervention "Words in a Bag", where they had a chance to test out their communication skills and identify ways to improve their communication techniques.  Clinical Observations/Feedback:  Patient came to group late due to unknown reasons. Individual was social with peers and staff while participating in the intervention.   Sherrita Riederer LRT/CTRS         Charels Stambaugh 01/08/2020 12:14 PM

## 2020-01-08 NOTE — Plan of Care (Signed)
Patient new to the unit this evening, hasn't had time to progress  Problem: Education: Goal: Knowledge of [facility] Education information/materials will improve Outcome: Not Progressing Goal: Emotional status will improve Outcome: Not Progressing Goal: Mental status will improve Outcome: Not Progressing Goal: Verbalization of understanding the information provided will improve Outcome: Not Progressing   Problem: Safety: Goal: Periods of time without injury will increase Outcome: Not Progressing   Problem: Activity: Goal: Will verbalize the importance of balancing activity with adequate rest periods Outcome: Not Progressing   Problem: Education: Goal: Will be free of psychotic symptoms Outcome: Not Progressing Goal: Knowledge of the prescribed therapeutic regimen will improve Outcome: Not Progressing

## 2020-01-08 NOTE — [facility] Suicide Risk Assessment (Signed)
[facility] INPATIENT:  Family/Significant Other Suicide Prevention Education  Suicide Prevention Education:  Patient Refusal for Family/Significant Other Suicide Prevention Education: The patient Tracy Poole has refused to provide written consent for family/significant other to be provided Family/Significant Other Suicide Prevention Education during admission and/or prior to discharge.  Physician notified.  Terrez Ander T Mycah Formica 01/08/2020, 9:43 AM

## 2020-01-08 NOTE — Tx Team (Signed)
Initial Treatment Plan 01/08/2020 2:15 AM [name] OOI:[id number]    PATIENT STRESSORS: Marital or family conflict Medication change or noncompliance   PATIENT STRENGTHS: Motivation for treatment/growth Supportive family/friends   PATIENT IDENTIFIED PROBLEMS: Schizophrenia     Depression     Anxiety              DISCHARGE CRITERIA:  Improved stabilization in mood, thinking, and/or behavior Motivation to continue treatment in a less acute level of care  PRELIMINARY DISCHARGE PLAN:   PATIENT/FAMILY INVOLVEMENT: This treatment plan has been presented to and reviewed with the patient, Tracy Poole, The patient and family have been given the opportunity to ask questions and make suggestions.  Trula Ore, RN 01/08/2020, 2:15 AM

## 2020-01-08 NOTE — Progress Notes (Signed)
Recreation Therapy Notes  INPATIENT RECREATION THERAPY ASSESSMENT  Patient Details Name: Tracy Poole MRN: [id number] DOB: 05/17/1996 Today's Date: 01/08/2020       Information Obtained From: Patient  Able to Participate in Assessment/Interview: Yes  Patient Presentation: Responsive  Reason for Admission (Per Patient): Active Symptoms  Patient Stressors:    Coping Skills:   Talk  Leisure Interests (2+):  [employer] - Shopping mall  Frequency of Recreation/Participation: Chief Executive Officer of Community Resources:     [employer]:     Current Use:    If no, Barriers?:    Expressed Interest in [employer] Information:    [employer] of Residence:  Film/video editor  Patient Main Form of Transportation:    Patient Strengths:  N/A  Patient Identified Areas of Improvement:  Transportation  Patient Goal for Hospitalization:  Getting medication right  Current SI (including self-harm):  No  Current HI:  No  Current AVH: No  Staff Intervention Plan: Group Attendance, Collaborate with Interdisciplinary Treatment Team  Consent to Intern Participation: N/A  Tayson Schnelle 01/08/2020, 3:11 PM

## 2020-01-08 NOTE — Tx Team (Signed)
Initial Treatment Plan 01/08/2020 2:46 AM [name] QSY:[id number]    PATIENT STRESSORS: Marital or family conflict Medication change or noncompliance   PATIENT STRENGTHS: Motivation for treatment/growth Supportive family/friends   PATIENT IDENTIFIED PROBLEMS: Psychosis     Depression       Anxiety            DISCHARGE CRITERIA:  Improved stabilization in mood, thinking, and/or behavior Motivation to continue treatment in a less acute level of care  PRELIMINARY DISCHARGE PLAN: Outpatient therapy  PATIENT/FAMILY INVOLVEMENT: This treatment plan has been presented to and reviewed with the patient, Tracy Poole, The patient and family have been given the opportunity to ask questions and make suggestions.  Trula Ore, RN 01/08/2020, 2:46 AM

## 2020-01-08 NOTE — Progress Notes (Signed)
Admission Note:  24 yr female who presents IVC in no acute distress for the treatment of Psychosis.  Patient appears flat and sad at times she was tearful upon arrival on the unit she stated " I don't need to be here"  " I just need to get injection not admitted "   Patient was calm and cooperative with admission process. She currently denies SI/HI/AVH and contracts for safety upon admission. Patient's thoughts are organized and coherent, no bizarre behavior noted at this time.  Patient has Past medical Hx of Asthma, Depression, Anxiety and Schizophrenia. Patient's skin was assessed and found to be clear of any abnormal marks, her skin is warm and dry she was also searched and no contraband found. Patient was shown the plan of care, unit policies,  explanation and understanding verbalized, and consents obtained.  Patient was offered emotional support ad encouragement, 15 minutes safety checks maintained, will continue to monitor.

## 2020-01-08 NOTE — [facility] Group Notes (Signed)
Emotional Regulation 01/08/2020 1PM  Type of Therapy/Topic:  Group Therapy:  Emotion Regulation  Participation Level:  Minimal   Description of Group:   The purpose of this group is to assist patients in learning to regulate negative emotions and experience positive emotions. Patients will be guided to discuss ways in which they have been vulnerable to their negative emotions. These vulnerabilities will be juxtaposed with experiences of positive emotions or situations, and patients will be challenged to use positive emotions to combat negative ones. Special emphasis will be placed on coping with negative emotions in conflict situations, and patients will process healthy conflict resolution skills.  Therapeutic Goals: 1. Patient will identify two positive emotions or experiences to reflect on in order to balance out negative emotions 2. Patient will label two or more emotions that they find the most difficult to experience 3. Patient will demonstrate positive conflict resolution skills through discussion and/or role plays  Summary of Patient Progress:  Minimal participation during session. Pt identified shopping as a "stress reliever" that she uses to regulate negative emotions. Group members discussed with pt being mindful of spending habits and impulsivity when she finds herself in vulnerable situations. Pt receptive to feedback from group members.    Therapeutic Modalities:   Cognitive Behavioral Therapy Feelings Identification Dialectical Behavioral Therapy   Suzan Slick, LCSW 01/08/2020 1:59 PM

## 2020-01-08 NOTE — H&P (Signed)
Psychiatric Admission Assessment Adult  Patient Identification: Tracy Poole MRN:  [id number] Date of Evaluation:  01/08/2020 Chief Complaint:  Bipolar 1 disorder (HCC) [F31.9] Principal Diagnosis: Schizophrenia, paranoid (HCC) Diagnosis:  Principal Problem:   Schizophrenia, paranoid (HCC)  History of Present Illness: Patient seen chart reviewed.  24 year old woman known from previous encounters.  Patient brought to the hospital under involuntary commitment papers filed by her mother that report that the patient has been more disorganized and paranoid recently.  Apparently on the day of admission she got up first thing in the morning and was taking her 9-year-old child and walking out of the house.  She tells me the same thing she told her mother which is that she was going to stay with her brother and sister.  Mother reports that these are people who are not involved in her life at all and that this plan made no sense.  Patient not able to acknowledge any of that and becomes disorganized when confronted with it.  Tells me at one point she gets along fine with her mother but at another point says that she does not like being around her mother.  Patient admits that she did not follow-up with psychiatric treatment after last hospitalization.  She did not get her follow-up long-acting injectable or take any antipsychotic medication.  Denies any alcohol or drug abuse.  Admits that she is not been sleeping well mood has been down a little but completely denies any thoughts of self-harm or harm to anyone else.  Patient is very anxious and dysphoric at the idea of not being discharged from the hospital immediately. Associated Signs/Symptoms: Depression Symptoms:  psychomotor agitation, difficulty concentrating, anxiety, (Hypo) Manic Symptoms:  Distractibility, Impulsivity, Anxiety Symptoms:  Excessive Worry, Psychotic Symptoms:  Delusions, Paranoia, PTSD Symptoms: Negative Total Time spent with  patient: 1 hour  Past Psychiatric History: Patient has had several prior hospitalizations going back 2 or 3 years.  Seems to have developed a consistent pattern of psychotic symptoms.  Not necessarily with full mood symptoms and the diagnosis probably I think is more likely to be schizophrenia than a mood disorder.  She has shown very good response to antipsychotic medication in the past but has had poor compliance with outpatient treatment.  No history of suicide attempts no history of substance abuse  Is the patient at risk to self? Yes.    Has the patient been a risk to self in the past 6 months? Yes.    Has the patient been a risk to self within the distant past? Yes.    Is the patient a risk to others? Yes.    Has the patient been a risk to others in the past 6 months? Yes.    Has the patient been a risk to others within the distant past? Yes.     Prior Inpatient Therapy:   Prior Outpatient Therapy:    Alcohol Screening: 1. How often do you have a drink containing alcohol?: Never 2. How many drinks containing alcohol do you have on a typical day when you are drinking?: 1 or 2 3. How often do you have six or more drinks on one occasion?: Never AUDIT-C Score: 0 4. How often during the last year have you found that you were not able to stop drinking once you had started?: Never 5. How often during the last year have you failed to do what was normally expected from you becasue of drinking?: Never 6. How often during the last  year have you needed a first drink in the morning to get yourself going after a heavy drinking session?: Never 7. How often during the last year have you had a feeling of guilt of remorse after drinking?: Never 8. How often during the last year have you been unable to remember what happened the night before because you had been drinking?: Never 9. Have you or someone else been injured as a result of your drinking?: No 10. Has a relative or friend or a doctor or another  health worker been concerned about your drinking or suggested you cut down?: No Alcohol Use Disorder Identification Test Final Score (AUDIT): 0 Alcohol Brief Interventions/Follow-up: AUDIT Score <7 follow-up not indicated Substance Abuse History in the last 12 months:  No. Consequences of Substance Abuse: Negative Previous Psychotropic Medications: Yes  Psychological Evaluations: Yes  Past Medical History:  Past Medical History:  Diagnosis Date  . Anxiety   . Asthma   . Depression   . Schizophrenia [facility])     Past Surgical History:  Procedure Laterality Date  . CESAREAN SECTION    . EUSTACHIAN TUBE DILATION    . HERNIA REPAIR     Family History:  Family History  Problem Relation Age of Onset  . Bladder Cancer Neg Hx   . Kidney cancer Neg Hx    Family Psychiatric  History: None reported Tobacco Screening: Have you used any form of tobacco in the last 30 days? (Cigarettes, Smokeless Tobacco, Cigars, and/or Pipes): Yes Tobacco use, Select all that apply: 5 or more cigarettes per day Are you interested in Tobacco Cessation Medications?: No, patient refused Counseled patient on smoking cessation including recognizing danger situations, developing coping skills and basic information about quitting provided: Refused/Declined practical counseling Social History:  Social History   Substance and Sexual Activity  Alcohol Use Yes   Comment: occasionallly     Social History   Substance and Sexual Activity  Drug Use No    Additional Social History:                           Allergies:  No Known Allergies Lab Results:  Results for orders placed or performed during the hospital encounter of 01/07/20 (from the past 48 hour(s))  Comprehensive metabolic panel     Status: Abnormal   Collection Time: 01/07/20  1:06 PM  Result Value Ref Range   Sodium 137 135 - 145 mmol/L   Potassium 3.3 (L) 3.5 - 5.1 mmol/L   Chloride 105 98 - 111 mmol/L   CO2 24 22 - 32 mmol/L    Glucose, Bld 91 70 - 99 mg/dL   BUN 7 6 - 20 mg/dL   Creatinine, Ser 4.65 0.44 - 1.00 mg/dL   Calcium 9.2 8.9 - 03.5 mg/dL   Total Protein 7.9 6.5 - 8.1 g/dL   Albumin 4.1 3.5 - 5.0 g/dL   AST 23 15 - 41 U/L   ALT 33 0 - 44 U/L   Alkaline Phosphatase 92 38 - 126 U/L   Total Bilirubin 1.3 (H) 0.3 - 1.2 mg/dL   GFR calc non Af Amer >60 >60 mL/min   GFR calc Af Amer >60 >60 mL/min   Anion gap 8 5 - 15    Comment: Performed at [facility], [address], [place], [place] [address]  Ethanol     Status: None   Collection Time: 01/07/20  1:06 PM  Result Value Ref Range  Alcohol, Ethyl (B) <10 <10 mg/dL    Comment: (NOTE) Lowest detectable limit for serum alcohol is 10 mg/dL. For medical purposes only. Performed at [facility], [address] [place], [place] [address]   Salicylate level     Status: Abnormal   Collection Time: 01/07/20  1:06 PM  Result Value Ref Range   Salicylate Lvl <7.0 (L) 7.0 - 30.0 mg/dL    Comment: Performed at [facility], [address] [place], [place] [address]  Acetaminophen level     Status: Abnormal   Collection Time: 01/07/20  1:06 PM  Result Value Ref Range   Acetaminophen (Tylenol), Serum <10 (L) 10 - 30 ug/mL    Comment: (NOTE) Therapeutic concentrations vary significantly. A range of 10-30 ug/mL  may be an effective concentration for many patients. However, some  are best treated at concentrations outside of this range. Acetaminophen concentrations >150 ug/mL at 4 hours after ingestion  and >50 ug/mL at 12 hours after ingestion are often associated with  toxic reactions. Performed at [facility], [address] [place], [place] [address]   cbc     Status: Abnormal   Collection Time: 01/07/20  1:06 PM  Result Value Ref Range   WBC 13.0 (H) 4.0 - 10.5 K/uL   RBC 4.59 3.87 - 5.11 MIL/uL   Hemoglobin 12.8 12.0 - 15.0 g/dL   HCT 23.5 36.1 - 44.3 %   MCV 83.0 80.0 - 100.0 fL   MCH 27.9 26.0 -  34.0 pg   MCHC 33.6 30.0 - 36.0 g/dL   RDW 15.4 00.8 - 67.6 %   Platelets 236 150 - 400 K/uL   nRBC 0.0 0.0 - 0.2 %    Comment: Performed at [facility], [address], [place], [place] [address]  Urine Drug Screen, Qualitative     Status: None   Collection Time: 01/07/20  1:10 PM  Result Value Ref Range   Tricyclic, Ur Screen NONE DETECTED NONE DETECTED   Amphetamines, Ur Screen NONE DETECTED NONE DETECTED   MDMA (Ecstasy)Ur Screen NONE DETECTED NONE DETECTED   Cocaine Metabolite,Ur [place] NONE DETECTED NONE DETECTED   Opiate, Ur Screen NONE DETECTED NONE DETECTED   Phencyclidine (PCP) Ur S NONE DETECTED NONE DETECTED   Cannabinoid 50 Ng, Ur [place] NONE DETECTED NONE DETECTED   Barbiturates, Ur Screen NONE DETECTED NONE DETECTED   Benzodiazepine, Ur Scrn NONE DETECTED NONE DETECTED   Methadone Scn, Ur NONE DETECTED NONE DETECTED    Comment: (NOTE) Tricyclics + metabolites, urine    Cutoff 1000 ng/mL Amphetamines + metabolites, urine  Cutoff 1000 ng/mL MDMA (Ecstasy), urine              Cutoff 500 ng/mL Cocaine Metabolite, urine          Cutoff 300 ng/mL Opiate + metabolites, urine        Cutoff 300 ng/mL Phencyclidine (PCP), urine         Cutoff 25 ng/mL Cannabinoid, urine                 Cutoff 50 ng/mL Barbiturates + metabolites, urine  Cutoff 200 ng/mL Benzodiazepine, urine              Cutoff 200 ng/mL Methadone, urine                   Cutoff 300 ng/mL The urine drug screen provides only a preliminary, unconfirmed analytical test result and should not be used for  non-medical purposes. Clinical consideration and professional judgment should be applied to any positive drug screen result due to possible interfering substances. A more specific alternate chemical method must be used in order to obtain a confirmed analytical result. Gas chromatography / mass spectrometry (GC/MS) is the preferred confirmat ory method. Performed at [facility], [address]  [place], [place] [address]   Pregnancy, urine POC     Status: None   Collection Time: 01/07/20  1:23 PM  Result Value Ref Range   Preg Test, Ur NEGATIVE NEGATIVE    Comment:        THE SENSITIVITY OF THIS METHODOLOGY IS >24 mIU/mL   Respiratory Panel by RT PCR (Flu A&B, Covid) - Nasopharyngeal Swab     Status: None   Collection Time: 01/07/20  5:18 PM   Specimen: Nasopharyngeal Swab  Result Value Ref Range   SARS Coronavirus 2 by RT PCR NEGATIVE NEGATIVE    Comment: (NOTE) SARS-CoV-2 target nucleic acids are NOT DETECTED. The SARS-CoV-2 RNA is generally detectable in upper respiratoy specimens during the acute phase of infection. The lowest concentration of SARS-CoV-2 viral copies this assay can detect is 131 copies/mL. A negative result does not preclude SARS-Cov-2 infection and should not be used as the sole basis for treatment or other patient management decisions. A negative result may occur with  improper specimen collection/handling, submission of specimen other than nasopharyngeal swab, presence of viral mutation(s) within the areas targeted by this assay, and inadequate number of viral copies (<131 copies/mL). A negative result must be combined with clinical observations, patient history, and epidemiological information. The expected result is Negative. Fact Sheet for Patients:  https://www.moore.com/ Fact Sheet for Healthcare Providers:  https://www.young.biz/ This test is not yet ap proved or cleared by the [place] FDA and  has been authorized for detection and/or diagnosis of SARS-CoV-2 by FDA under an Emergency Use Authorization (EUA). This EUA will remain  in effect (meaning this test can be used) for the duration of the COVID-19 declaration under Section 564(b)(1) of the Act, 21 U.S.C. section 360bbb-3(b)(1), unless the authorization is terminated or revoked sooner.    Influenza A by PCR NEGATIVE NEGATIVE   Influenza B by  PCR NEGATIVE NEGATIVE    Comment: (NOTE) The Xpert Xpress SARS-CoV-2/FLU/RSV assay is intended as an aid in  the diagnosis of influenza from Nasopharyngeal swab specimens and  should not be used as a sole basis for treatment. Nasal washings and  aspirates are unacceptable for Xpert Xpress SARS-CoV-2/FLU/RSV  testing. Fact Sheet for Patients: https://www.moore.com/ Fact Sheet for Healthcare Providers: https://www.young.biz/ This test is not yet approved or cleared by the [place] FDA and  has been authorized for detection and/or diagnosis of SARS-CoV-2 by  FDA under an Emergency Use Authorization (EUA). This EUA will remain  in effect (meaning this test can be used) for the duration of the  Covid-19 declaration under Section 564(b)(1) of the Act, 21  U.S.C. section 360bbb-3(b)(1), unless the authorization is  terminated or revoked. Performed at [facility], [address] [place], [place] [address]     Blood Alcohol level:  Lab Results  Component Value Date   [facility] <10 01/07/2020   ETH <10 11/28/2019    Metabolic Disorder Labs:  Lab Results  Component Value Date   HGBA1C 5.1 11/28/2019   MPG 99.67 11/28/2019   MPG 93.93 03/27/2018   Lab Results  Component Value Date   PROLACTIN 53.4 (H) 03/28/2017   Lab Results  Component Value Date   CHOL 148 11/28/2019   TRIG 42 11/28/2019   HDL 33 (L) 11/28/2019   CHOLHDL 4.5 11/28/2019   VLDL 8 11/28/2019   LDLCALC 107 (H) 11/28/2019   LDLCALC 91 03/27/2018    Current Medications: Current Facility-Administered Medications  Medication Dose Route Frequency Provider Last Rate Last Admin  . acetaminophen (TYLENOL) tablet 650 mg  650 mg Oral Q6H PRN Gillermo Murdochhompson, Jacqueline, NP      . alum & mag hydroxide-simeth (MAALOX/MYLANTA) 200-200-20 MG/5ML suspension 30 mL  30 mL Oral Q4H PRN Gillermo Murdochhompson, Jacqueline, NP      . ARIPiprazole ER (ABILIFY MAINTENA) injection 400 mg  400 mg  Intramuscular Q28 days Gillermo Murdochhompson, Jacqueline, NP   400 mg at 01/08/20 1111  . benztropine (COGENTIN) tablet 2 mg  2 mg Oral BID PRN Gillermo Murdochhompson, Jacqueline, NP      . haloperidol (HALDOL) tablet 5 mg  5 mg Oral BID Gillermo Murdochhompson, Jacqueline, NP   5 mg at 01/08/20 0804  . magnesium hydroxide (MILK OF MAGNESIA) suspension 30 mL  30 mL Oral Daily PRN Gillermo Murdochhompson, Jacqueline, NP      . nicotine (NICODERM CQ - dosed in mg/24 hours) patch 21 mg  21 mg Transdermal Daily Meah Jiron T, MD      . traZODone (DESYREL) tablet 50 mg  50 mg Oral QHS PRN Gillermo Murdochhompson, Jacqueline, NP       PTA Medications: Medications Prior to Admission  Medication Sig Dispense Refill Last Dose  . ARIPiprazole ER (ABILIFY MAINTENA) 400 MG SRER injection Inject 2 mLs (400 mg total) into the muscle every 28 (twenty-eight) days. Next dose due 12-28-2019 1 each 1   . benztropine (COGENTIN) 2 MG tablet Take 1 tablet (2 mg total) by mouth 2 (two) times daily as needed for tremors (spitting). 60 tablet 1   . haloperidol (HALDOL) 5 MG tablet Take 1 tablet (5 mg total) by mouth 2 (two) times daily. 60 tablet 1   . traZODone (DESYREL) 50 MG tablet Take 1 tablet (50 mg total) by mouth at bedtime as needed for sleep. 30 tablet 1     Musculoskeletal: Strength & Muscle Tone: within normal limits Gait & Station: normal Patient leans: N/A  Psychiatric Specialty Exam: Physical Exam  Nursing note and vitals reviewed. Constitutional: She appears well-developed and well-nourished.  HENT:  Head: Normocephalic and atraumatic.  Eyes: Pupils are equal, round, and reactive to light. Conjunctivae are normal.  Cardiovascular: Regular rhythm and normal heart sounds.  Respiratory: Effort normal.  GI: Soft.  Musculoskeletal:        General: Normal range of motion.     Cervical back: Normal range of motion.  Neurological: She is alert.  Skin: Skin is warm and dry.  Psychiatric: Her mood appears anxious. Her affect is blunt. Her speech is delayed. She is  agitated. She is not aggressive. Thought content is paranoid and delusional. Cognition and memory are impaired. She expresses impulsivity. She expresses no homicidal and no suicidal ideation.    Review of Systems  Constitutional: Negative.   HENT: Negative.   Eyes: Negative.   Respiratory: Negative.   Cardiovascular: Negative.   Gastrointestinal: Negative.   Musculoskeletal: Negative.   Skin: Negative.   Neurological: Negative.   Psychiatric/Behavioral: Positive for dysphoric mood and sleep disturbance. Negative for agitation, behavioral problems and confusion. The patient is nervous/anxious.     Blood pressure 129/75, pulse (!) 105, temperature 98.6 F (37 C), temperature source Oral, resp. rate 16, height 5\' 4"  (1.626  m), weight 120.2 kg, SpO2 100 %.Body mass index is 45.49 kg/m.  General Appearance: Casual  Eye Contact:  Fair  Speech:  Clear and Coherent  Volume:  Increased  Mood:  Dysphoric and Irritable  Affect:  Congruent  Thought Process:  Disorganized  Orientation:  Full (Time, Place, and Person)  Thought Content:  Illogical, Paranoid Ideation and Rumination  Suicidal Thoughts:  No  Homicidal Thoughts:  No  Memory:  Immediate;   Fair Recent;   Fair Remote;   Fair  Judgement:  Impaired  Insight:  Shallow  Psychomotor Activity:  Decreased and Psychomotor Retardation  Concentration:  Concentration: Poor  Recall:  Poor  Fund of Knowledge:  Poor  Language:  Fair  Akathisia:  No  Handed:  Right  AIMS (if indicated):     Assets:  Communication Skills Desire for Improvement Housing Physical Health Resilience Social Support  ADL's:  Impaired  Cognition:  Impaired,  Mild  Sleep:  Number of Hours: 3.25    Treatment Plan Summary: Daily contact with patient to assess and evaluate symptoms and progress in treatment, Medication management and Plan Patient already got her Abilify injection and on interview today already seem to be a little better put together.  She  minimized or denied all of the symptoms and behaviors reported by her mother but it appears that yesterday the people in the emergency room talked to the mother and confirmed the seriousness of the whole situation.  Patient is wanting to be discharged saying that she came here just to get her shot.  I pointed out to her that she did not actually voluntarily come to the hospital at all but was sent here with the police because of her behavior.  Reviewed with her that she has had multiple hospitalizations just in this past year and that her noncompliance is setting her up for more dangerous behavior that could result in either harm or possibly losing custody of her child which is something that would really be terrible for her.  I told her I did not feel comfortable discharging her today and feel that she needs to stay for at least another couple of days to stabilize.  We will also continue the Haldol as it was prescribed when she was in the hospital last time.  Full evaluation by treatment team.  Observation Level/Precautions:  15 minute checks  Laboratory:  Chemistry Profile  Psychotherapy:    Medications:    Consultations:    Discharge Concerns:    Estimated LOS:  Other:     Physician Treatment Plan for Primary Diagnosis: Schizophrenia, paranoid ([place]) Long Term Goal(s): Improvement in symptoms so as ready for discharge  Short Term Goals: Ability to verbalize feelings will improve and Ability to demonstrate self-control will improve  Physician Treatment Plan for Secondary Diagnosis: Principal Problem:   Schizophrenia, paranoid ([place])  Long Term Goal(s): Improvement in symptoms so as ready for discharge  Short Term Goals: Ability to maintain clinical measurements within normal limits will improve and Compliance with prescribed medications will improve  I certify that inpatient services furnished can reasonably be expected to improve the patient's condition.    Alethia Berthold, MD 2/12/20212:26  PM

## 2020-01-08 NOTE — ED Provider Notes (Signed)
-----------------------------------------   12:56 AM on 01/08/2020 -----------------------------------------   Blood pressure 122/60, pulse 94, temperature 98.3 F (36.8 C), temperature source Oral, resp. rate 18, height 1.626 m (5\' 4" ), weight 120.2 kg, SpO2 100 %.  The patient is calm and cooperative at this time.  As per TTS, the patient is being admitted downstairs to the behavioral unit.   , MD 01/08/20 [phone]

## 2020-01-08 NOTE — BHH Counselor (Signed)
Adult Comprehensive Assessment  Patient ID: Tracy Poole, female   DOB: 1996/09/18, 24 y.o.   MRN: [id number]  Information Source:   Information source: Patient.    Current Stressors:  Patient states their primary concerns and needs for treatment are:: Pt reports she came to the hospital to get her medication changed and states she had no transportation to get to her appointments.  Patient states their goals for this hospitalization and ongoing recovery are:: "Go home" Employment / Job issues: Patient is currently unemployed Family Relationships: Patient reports that she has a good relationship with her mother. Financial / Lack of resources (include bankruptcy): No income relies on mother for financial assistance. Housing / Lack of housing: Lives with mother Physical health (include injuries & life threatening diseases): Pt denies stressors Social relationships: Pt denies stressors Substance abuse: Pt denies substance use. Bereavement / Loss: Pt denies stressors.   Living/Environment/Situation:  Living Arrangements: Parent, Children Living conditions (as described by patient or guardian): "Good" Who else lives in the home?: [name]  How long has patient lived in current situation?: 2 years What is atmosphere in current home: Comfortable, Supportive   Family History:  Marital status: Single Are you sexually active?: No What is your sexual orientation?: Bisexual Has your sexual activity been affected by drugs, alcohol, medication, or emotional stress?: No Does patient have children?: Yes How many children?: 1 daughter age 7 How is patient's relationship with their children?: "Great"   Childhood History:  By whom was/is the patient raised?: Mother, Grandparents Additional childhood history information: N/A Description of patient's relationship with caregiver when they were a child: "Patient states that she had a good relationship." Patient's description of  current relationship with people who raised him/her: "Patient states that she gets along with her mother and grandparents." How were you disciplined when you got in trouble as a child/adolescent?: N/A Does patient have siblings?: Yes Number of Siblings: 2 Description of patient's current relationship with siblings: "Patient states that she has siblings on her father side." Did patient suffer any verbal/emotional/physical/sexual abuse as a child?: No Did patient suffer from severe childhood neglect?: No Has patient ever been sexually abused/assaulted/raped as an adolescent or adult?: No Was the patient ever a victim of a crime or a disaster?: No Witnessed domestic violence?: No Has patient been effected by domestic violence as an adult?: No   Education:  Highest grade of school patient has completed: 11th grade Currently a student?: No Learning disability?: No   Employment/Work Situation:   Employment situation: Unemployed Patient's job has been impacted by current illness: N/A What is the longest time patient has a held a job?: 6 months Where was the patient employed at that time?: [employer] Did You Receive Any Psychiatric Treatment/Services While in Equities trader?: No Are There Guns or Other Weapons in Your Home?: No, pt denies Archivist Resources:   Financial resources: [employer], Medicaid Does patient have a Lawyer or guardian?: No   Alcohol/Substance Abuse:   What has been your use of drugs/alcohol within the last 12 months?: Patient denies substance abuse. If attempted suicide, did drugs/alcohol play a role in this?: No Alcohol/Substance Abuse Treatment Hx: Denies past history Has alcohol/substance abuse ever caused legal problems?: No   Social Support System:   Patient's Community Support System: Fair Describe Community Support System: Pt's mother Type of faith/religion: None reported. How does patient's faith help to cope with current illness?:  N/A   Leisure/Recreation:  Leisure and Hobbies: Scientist, clinical (histocompatibility and immunogenetics), helping other people".   Strengths/Needs:   What is the patient's perception of their strengths?: "I don't know" Patient states they can use these personal strengths during their treatment to contribute to their recovery: Patient did not comment. Patient states these barriers may affect/interfere with their treatment: N/A Patient states these barriers may affect their return to the community: N/A Other important information patient would like considered in planning for their treatment: N/A   Discharge Plan:   Currently receiving community mental health services: Yes, from [place] but states she had difficulty making her appointments Patient states concerns and preferences for aftercare planning are: Pt states she would like to resume treatment at [facility]. Patient states they will know when they are safe and ready for discharge when: "I just came here to get my medicine changed, I can leave today" Does patient have access to transportation?: Yes Does patient have financial barriers related to discharge medications?: No Will patient be returning to same living situation after discharge?: Yes(pt's mother and daughter.)    Summary/Recommendations:   Summary and Recommendations (to be completed by the evaluator): Patient is a 24 yr old female with a diagnosis of schizophrenia. Pt says she came to the hospital to get her medication changed. Chart review states she was brought in via law enforcement due to displaying psychotic behavior. Pt is an existing pt at [facility] and states she would like to resume treatment with the provider. Pt last seen at [facility] on September 2020.Recommendations for pt include: crisis stabilization, therapeutic milieu, medication management, attend and participate in group therapy, and development of a comprehensive mental wellness plan.  Tracy Poole Tracy Poole. 01/08/2020

## 2020-01-08 NOTE — Plan of Care (Signed)
Patient appears in a sad affect.Tearful when she talks about her baby.Patient asking "I got my injections.Can I go home now."Patient in & out of room.Minimal interactions with peers.Compliant with medications.Appetite and energy level good.Support and encouragement given.

## 2020-01-08 NOTE — BHH Suicide Risk Assessment (Signed)
[facility] Admission Suicide Risk Assessment   Nursing information obtained from:  Patient Demographic factors:  NA Current Mental Status:  NA Loss Factors:  NA Historical Factors:  NA Risk Reduction Factors:  NA  Total Time spent with patient: 1 hour Principal Problem: Schizophrenia, paranoid ([place]) Diagnosis:  Principal Problem:   Schizophrenia, paranoid ([place])  Subjective Data: Patient seen and chart reviewed.  Patient known from previous hospitalizations.  Patient was petition by her mother with reports that she has been acting bizarrely been paranoid and has shown some behavior that could potentially be endangering to herself or to her child.  Patient minimizes all of this.  Denies suicidal ideation and denies any hallucinations but admits she has been noncompliant with treatment.  Continued Clinical Symptoms:  Alcohol Use Disorder Identification Test Final Score (AUDIT): 0 The "Alcohol Use Disorders Identification Test", Guidelines for Use in Primary Care, Second Edition.  World Pharmacologist [facility]). Score between 0-7:  no or low risk or alcohol related problems. Score between 8-15:  moderate risk of alcohol related problems. Score between 16-19:  high risk of alcohol related problems. Score 20 or above:  warrants further diagnostic evaluation for alcohol dependence and treatment.   CLINICAL FACTORS:   Schizophrenia:   Less than 42 years old   Musculoskeletal: Strength & Muscle Tone: within normal limits Gait & Station: normal Patient leans: N/A  Psychiatric Specialty Exam: Physical Exam  Nursing note and vitals reviewed. Constitutional: She appears well-developed and well-nourished.  HENT:  Head: Normocephalic and atraumatic.  Eyes: Pupils are equal, round, and reactive to light. Conjunctivae are normal.  Cardiovascular: Regular rhythm and normal heart sounds.  Respiratory: Effort normal. No respiratory distress.  GI: Soft.  Musculoskeletal:        General: Normal  range of motion.     Cervical back: Normal range of motion.  Neurological: She is alert.  Skin: Skin is warm and dry.  Psychiatric: Her speech is normal. Her affect is labile. She is agitated. She is not aggressive. Thought content is paranoid. Cognition and memory are impaired. She expresses impulsivity. She exhibits a depressed mood. She expresses no homicidal and no suicidal ideation.    Review of Systems  Constitutional: Negative.   HENT: Negative.   Eyes: Negative.   Respiratory: Negative.   Cardiovascular: Negative.   Gastrointestinal: Negative.   Musculoskeletal: Negative.   Skin: Negative.   Neurological: Negative.   Psychiatric/Behavioral: The patient is nervous/anxious.     Blood pressure 129/75, pulse (!) 105, temperature 98.6 F (37 C), temperature source Oral, resp. rate 16, height 5\' 4"  (1.626 m), weight 120.2 kg, SpO2 100 %.Body mass index is 45.49 kg/m.  General Appearance: Casual  Eye Contact:  Fair  Speech:  Clear and Coherent  Volume:  Decreased  Mood:  Anxious, Dysphoric and Irritable  Affect:  Congruent  Thought Process:  Disorganized  Orientation:  Full (Time, Place, and Person)  Thought Content:  Illogical, Paranoid Ideation and Rumination  Suicidal Thoughts:  No  Homicidal Thoughts:  No  Memory:  Immediate;   Fair Recent;   Fair Remote;   Fair  Judgement:  Impaired  Insight:  Lacking  Psychomotor Activity:  Decreased  Concentration:  Concentration: Poor  Recall:  [employer] of Knowledge:  Fair  Language:  Fair  Akathisia:  No  Handed:  Right  AIMS (if indicated):     Assets:  Desire for Improvement Financial Resources/Insurance Physical Health Social Support  ADL's:  Impaired  Cognition:  Impaired,  Mild  Sleep:  Number of Hours: 3.25      COGNITIVE FEATURES THAT CONTRIBUTE TO RISK:  Loss of executive function    SUICIDE RISK:   Minimal: No identifiable suicidal ideation.  Patients presenting with no risk factors but with morbid  ruminations; may be classified as minimal risk based on the severity of the depressive symptoms  PLAN OF CARE: Restart antipsychotic and other medication.  Full treatment team assessment.  Engagement in regular therapeutic activities.  Reassess dangerousness before arranging for discharge planning.  I certify that inpatient services furnished can reasonably be expected to improve the patient's condition.   Mordecai Rasmussen, MD 01/08/2020, 2:22 PM

## 2020-01-08 NOTE — BH Assessment (Signed)
Patient is to be admitted to [facility] by Psychiatric Nurse Practitioner Gillermo Murdoch.  Attending Physician will be Dr. Toni Amend.   Patient has been assigned to room 314, by [facility] Charge Nurse [place].    ER staff is aware of the admission:  [facility] ER Secretary    Dr. Royston Sinner, ER MD   Eileen Stanford Patient's Nurse   Marcelino Duster Patient Access.

## 2020-01-09 NOTE — Tx Team (Signed)
Interdisciplinary Treatment and Diagnostic Plan Update  01/09/2020 Time of Session: 8:30am Tracy Poole MRN: [id number]  Principal Diagnosis: Schizophrenia, paranoid ([place])  Secondary Diagnoses: Principal Problem:   Schizophrenia, paranoid ([place])   Current Medications:  Current Facility-Administered Medications  Medication Dose Route Frequency Provider Last Rate Last Admin  . acetaminophen (TYLENOL) tablet 650 mg  650 mg Oral Q6H PRN Caroline Sauger, NP      . alum & mag hydroxide-simeth (MAALOX/MYLANTA) 200-200-20 MG/5ML suspension 30 mL  30 mL Oral Q4H PRN Caroline Sauger, NP      . ARIPiprazole ER (ABILIFY MAINTENA) injection 400 mg  400 mg Intramuscular Q28 days Caroline Sauger, NP   400 mg at 01/08/20 1111  . benztropine (COGENTIN) tablet 2 mg  2 mg Oral BID PRN Caroline Sauger, NP      . haloperidol (HALDOL) tablet 5 mg  5 mg Oral BID Caroline Sauger, NP   5 mg at 01/09/20 1607  . magnesium hydroxide (MILK OF MAGNESIA) suspension 30 mL  30 mL Oral Daily PRN Caroline Sauger, NP      . nicotine (NICODERM CQ - dosed in mg/24 hours) patch 21 mg  21 mg Transdermal Daily Clapacs, [name], MD   21 mg at 01/09/20 3710  . traZODone (DESYREL) tablet 50 mg  50 mg Oral QHS PRN Caroline Sauger, NP   50 mg at 01/08/20 2140   PTA Medications: Medications Prior to Admission  Medication Sig Dispense Refill Last Dose  . ARIPiprazole ER (ABILIFY MAINTENA) 400 MG SRER injection Inject 2 mLs (400 mg total) into the muscle every 28 (twenty-eight) days. Next dose due 12-28-2019 1 each 1   . benztropine (COGENTIN) 2 MG tablet Take 1 tablet (2 mg total) by mouth 2 (two) times daily as needed for tremors (spitting). 60 tablet 1   . haloperidol (HALDOL) 5 MG tablet Take 1 tablet (5 mg total) by mouth 2 (two) times daily. 60 tablet 1   . traZODone (DESYREL) 50 MG tablet Take 1 tablet (50 mg total) by mouth at bedtime as needed for sleep. 30 tablet 1     Patient Stressors:  Marital or family conflict Medication change or noncompliance  Patient Strengths: Motivation for treatment/growth Supportive family/friends  Treatment Modalities: Medication Management, Group therapy, Case management,  1 to 1 session with clinician, Psychoeducation, Recreational therapy.   Physician Treatment Plan for Primary Diagnosis: Schizophrenia, paranoid ([place]) Long Term Goal(s): Improvement in symptoms so as ready for discharge Improvement in symptoms so as ready for discharge   Short Term Goals: Ability to verbalize feelings will improve Ability to demonstrate self-control will improve Ability to maintain clinical measurements within normal limits will improve Compliance with prescribed medications will improve  Medication Management: Evaluate patient's response, side effects, and tolerance of medication regimen.  Therapeutic Interventions: 1 to 1 sessions, Unit Group sessions and Medication administration.  Evaluation of Outcomes: Progressing  Physician Treatment Plan for Secondary Diagnosis: Principal Problem:   Schizophrenia, paranoid ([place])  Long Term Goal(s): Improvement in symptoms so as ready for discharge Improvement in symptoms so as ready for discharge   Short Term Goals: Ability to verbalize feelings will improve Ability to demonstrate self-control will improve Ability to maintain clinical measurements within normal limits will improve Compliance with prescribed medications will improve     Medication Management: Evaluate patient's response, side effects, and tolerance of medication regimen.  Therapeutic Interventions: 1 to 1 sessions, Unit Group sessions and Medication administration.  Evaluation of Outcomes: Progressing   RN Treatment  Plan for Primary Diagnosis: Schizophrenia, paranoid ([facility]) Long Term Goal(s): Knowledge of disease and therapeutic regimen to maintain health will improve  Short Term Goals: Ability to remain free from injury will improve,  Ability to verbalize frustration and anger appropriately will improve, Ability to demonstrate self-control, Ability to participate in decision making will improve, Ability to verbalize feelings will improve, Ability to disclose and discuss suicidal ideas, Ability to identify and develop effective coping behaviors will improve and Compliance with prescribed medications will improve  Medication Management: RN will administer medications as ordered by provider, will assess and evaluate patient's response and provide education to patient for prescribed medication. RN will report any adverse and/or side effects to prescribing provider.  Therapeutic Interventions: 1 on 1 counseling sessions, Psychoeducation, Medication administration, Evaluate responses to treatment, Monitor vital signs and CBGs as ordered, Perform/monitor CIWA, COWS, AIMS and Fall Risk screenings as ordered, Perform wound care treatments as ordered.  Evaluation of Outcomes: Progressing   LCSW Treatment Plan for Primary Diagnosis: Schizophrenia, paranoid (HCC) Long Term Goal(s): Safe transition to appropriate next level of care at discharge, Engage patient in therapeutic group addressing interpersonal concerns.  Short Term Goals: Engage patient in aftercare planning with referrals and resources, Increase social support, Increase ability to appropriately verbalize feelings, Increase emotional regulation, Facilitate acceptance of mental health diagnosis and concerns and Increase skills for wellness and recovery  Therapeutic Interventions: Assess for all discharge needs, 1 to 1 time with Social worker, Explore available resources and support systems, Assess for adequacy in community support network, Educate family and significant other(s) on suicide prevention, Complete Psychosocial Assessment, Interpersonal group therapy.  Evaluation of Outcomes: Progressing   Progress in Treatment: Attending groups: Yes. Participating in groups:  No. Taking medication as prescribed: Yes. Toleration medication: Yes. Family/Significant other contact made: No, will contact:  Pt will identify a contact Patient understands diagnosis: Yes. Discussing patient identified problems/goals with staff: Yes. Medical problems stabilized or resolved: Yes. Denies suicidal/homicidal ideation: Yes. Issues/concerns per patient self-inventory: No. Other: None  New problem(s) identified: No, Describe:  None  New Short Term/Long Term Goal(s):  Patient Goals:  "go home"  Discharge Plan or Barriers: Pt will be connected to and follow up with after care services upon discharge.  Reason for Continuation of Hospitalization: Hallucinations Medication stabilization  Estimated Length of Stay: 3-5 days  Attendees: Patient: Tracy Poole 01/09/2020   Physician: Dr. Jeannine Kitten 01/09/2020   Nursing:  01/09/2020   RN Care Manager: 01/09/2020   Social Worker: Morene Antu, LCSWA 01/09/2020   Recreational Therapist:  01/09/2020   Other:  01/09/2020   Other:  01/09/2020   Other: 01/09/2020    Scribe for Treatment Team: Jimmey Ralph, LCSWA 01/09/2020 9:10 AM

## 2020-01-09 NOTE — BHH Group Notes (Signed)
01/09/2020 1:00pm   Type of Therapy and Topic:  Group Therapy:  Change and Accountability  Participation Level:  Minimal  Description of Group In this group, patients discussed power and accountability for change.  The group identified the challenges related to accountability and the difficulty of accepting the outcomes of negative behaviors.  Patients were encouraged to openly discuss a challenge/change they could take responsibility for.  Patients discussed the use of "change talk" and positive thinking as ways to support achievement of personal goals.  The group discussed ways to give support and empowerment to peers.  Therapeutic Goals: 1. Patients will state the relationship between personal power and accountability in the change process 2. Patients will identify the positive and negative consequences of a personal choice they have made 3. Patients will identify one challenge/choice they will take responsibility for making 4. Patients will discuss the role of "change talk" and the impact of positive thinking as it supports successful personal change 5. Patients will verbalize support and affirmation of change efforts in peers  Summary of Patient Progress:  Pt rated her current feelings, on a scale from 1-10 (10 being the best), as a 10, "because I have gotten all of this sleep". CSW observed pt falling asleep during the remaining of the group.   Therapeutic Modalities Solution Focused Brief Therapy Motivational Interviewing Cognitive Behavioral Therapy    Teresita Madura, MSW, [place] Clinical Social Worker  01/09/2020 12:12 PM

## 2020-01-09 NOTE — Progress Notes (Signed)
[facility] MD Progress Note  01/09/2020 9:08 AM Tracy Poole  MRN:  [id number] Subjective:   Tracy Poole is 32 she presents with an exacerbation of her underlying psychotic disorder involving paranoia, disorganized thought and behavior.  She has recalibrated already to some degree she is focused on discharge, she states "I needed to get my shot, Abilify" and denies current auditory or visual hallucinations and denies thoughts of harming self or others.  No involuntary movements. Principal Problem: Schizophrenia, paranoid (HCC) Diagnosis: Principal Problem:   Schizophrenia, paranoid (HCC)  Total Time spent with patient: 20 minutes  Past Psychiatric History: As discussed in the HPI multiple prior hospitalizations  Past Medical History:  Past Medical History:  Diagnosis Date  . Anxiety   . Asthma   . Depression   . Schizophrenia [facility])     Past Surgical History:  Procedure Laterality Date  . CESAREAN SECTION    . EUSTACHIAN TUBE DILATION    . HERNIA REPAIR     Family History:  Family History  Problem Relation Age of Onset  . Bladder Cancer Neg Hx   . Kidney cancer Neg Hx    Family Psychiatric  History: No new data Social History:  Social History   Substance and Sexual Activity  Alcohol Use Yes   Comment: occasionallly     Social History   Substance and Sexual Activity  Drug Use No    Social History   Socioeconomic History  . Marital status: Single    Spouse name: Not on file  . Number of children: Not on file  . Years of education: Not on file  . Highest education level: Not on file  Occupational History  . Not on file  Tobacco Use  . Smoking status: Current Every Day Smoker    Packs/day: 0.25    Types: Cigarettes  . Smokeless tobacco: Never Used  Substance and Sexual Activity  . Alcohol use: Yes    Comment: occasionallly  . Drug use: No  . Sexual activity: Yes    Birth control/protection: None  Other Topics Concern  . Not on file  Social History Narrative   . Not on file   Social Determinants of Health   Financial Resource Strain:   . Difficulty of Paying Living Expenses: Not on file  Food Insecurity:   . Worried About Programme researcher, broadcasting/film/video in the Last Year: Not on file  . Ran Out of Food in the Last Year: Not on file  Transportation Needs:   . Lack of Transportation (Medical): Not on file  . Lack of Transportation (Non-Medical): Not on file  Physical Activity:   . Days of Exercise per Week: Not on file  . Minutes of Exercise per Session: Not on file  Stress:   . Feeling of Stress : Not on file  Social Connections:   . Frequency of Communication with Friends and Family: Not on file  . Frequency of Social Gatherings with Friends and Family: Not on file  . Attends Religious Services: Not on file  . Active Member of Clubs or Organizations: Not on file  . Attends Banker Meetings: Not on file  . Marital Status: Not on file   Additional Social History:                         Sleep: Fair  Appetite:  Fair  Current Medications: Current Facility-Administered Medications  Medication Dose Route Frequency Provider Last Rate Last Admin  .  acetaminophen (TYLENOL) tablet 650 mg  650 mg Oral Q6H PRN Gillermo Murdoch, NP      . alum & mag hydroxide-simeth (MAALOX/MYLANTA) 200-200-20 MG/5ML suspension 30 mL  30 mL Oral Q4H PRN Gillermo Murdoch, NP      . ARIPiprazole ER (ABILIFY MAINTENA) injection 400 mg  400 mg Intramuscular Q28 days Gillermo Murdoch, NP   400 mg at 01/08/20 1111  . benztropine (COGENTIN) tablet 2 mg  2 mg Oral BID PRN Gillermo Murdoch, NP      . haloperidol (HALDOL) tablet 5 mg  5 mg Oral BID Gillermo Murdoch, NP   5 mg at 01/09/20 4097  . magnesium hydroxide (MILK OF MAGNESIA) suspension 30 mL  30 mL Oral Daily PRN Gillermo Murdoch, NP      . nicotine (NICODERM CQ - dosed in mg/24 hours) patch 21 mg  21 mg Transdermal Daily Clapacs, Jackquline Denmark, MD   21 mg at 01/09/20 3532  .  traZODone (DESYREL) tablet 50 mg  50 mg Oral QHS PRN Gillermo Murdoch, NP   50 mg at 01/08/20 2140    Lab Results:  Results for orders placed or performed during the hospital encounter of 01/07/20 (from the past 48 hour(s))  Comprehensive metabolic panel     Status: Abnormal   Collection Time: 01/07/20  1:06 PM  Result Value Ref Range   Sodium 137 135 - 145 mmol/L   Potassium 3.3 (L) 3.5 - 5.1 mmol/L   Chloride 105 98 - 111 mmol/L   CO2 24 22 - 32 mmol/L   Glucose, Bld 91 70 - 99 mg/dL   BUN 7 6 - 20 mg/dL   Creatinine, Ser 9.92 0.44 - 1.00 mg/dL   Calcium 9.2 8.9 - 42.6 mg/dL   Total Protein 7.9 6.5 - 8.1 g/dL   Albumin 4.1 3.5 - 5.0 g/dL   AST 23 15 - 41 U/L   ALT 33 0 - 44 U/L   Alkaline Phosphatase 92 38 - 126 U/L   Total Bilirubin 1.3 (H) 0.3 - 1.2 mg/dL   GFR calc non Af Amer >60 >60 mL/min   GFR calc Af Amer >60 >60 mL/min   Anion gap 8 5 - 15    Comment: Performed at [facility], [address] [place], [place] [address]  Ethanol     Status: None   Collection Time: 01/07/20  1:06 PM  Result Value Ref Range   Alcohol, Ethyl (B) <10 <10 mg/dL    Comment: (NOTE) Lowest detectable limit for serum alcohol is 10 mg/dL. For medical purposes only. Performed at [facility], [address] [place], [place] [address]   Salicylate level     Status: Abnormal   Collection Time: 01/07/20  1:06 PM  Result Value Ref Range   Salicylate Lvl <7.0 (L) 7.0 - 30.0 mg/dL    Comment: Performed at [facility], [address] [place], [place] [address]  Acetaminophen level     Status: Abnormal   Collection Time: 01/07/20  1:06 PM  Result Value Ref Range   Acetaminophen (Tylenol), Serum <10 (L) 10 - 30 ug/mL    Comment: (NOTE) Therapeutic concentrations vary significantly. A range of 10-30 ug/mL  may be an effective concentration for many patients. However, some  are best treated at concentrations outside of this range. Acetaminophen concentrations  >150 ug/mL at 4 hours after ingestion  and >50 ug/mL at 12 hours after ingestion are often associated with  toxic reactions. Performed at [facility], [address] [place], [place] [address]   cbc     Status: Abnormal   Collection Time: 01/07/20  1:06 PM  Result Value Ref Range   WBC 13.0 (H) 4.0 - 10.5 K/uL   RBC 4.59 3.87 - 5.11 MIL/uL   Hemoglobin 12.8 12.0 - 15.0 g/dL   HCT 19.3 79.0 - 24.0 %   MCV 83.0 80.0 - 100.0 fL   MCH 27.9 26.0 - 34.0 pg   MCHC 33.6 30.0 - 36.0 g/dL   RDW 97.3 53.2 - 99.2 %   Platelets 236 150 - 400 K/uL   nRBC 0.0 0.0 - 0.2 %    Comment: Performed at [facility], [address], [place], [place] [address]  Urine Drug Screen, Qualitative     Status: None   Collection Time: 01/07/20  1:10 PM  Result Value Ref Range   Tricyclic, Ur Screen NONE DETECTED NONE DETECTED   Amphetamines, Ur Screen NONE DETECTED NONE DETECTED   MDMA (Ecstasy)Ur Screen NONE DETECTED NONE DETECTED   Cocaine Metabolite,Ur [place] NONE DETECTED NONE DETECTED   Opiate, Ur Screen NONE DETECTED NONE DETECTED   Phencyclidine (PCP) Ur S NONE DETECTED NONE DETECTED   Cannabinoid 50 Ng, Ur [place] NONE DETECTED NONE DETECTED   Barbiturates, Ur Screen NONE DETECTED NONE DETECTED   Benzodiazepine, Ur Scrn NONE DETECTED NONE DETECTED   Methadone Scn, Ur NONE DETECTED NONE DETECTED    Comment: (NOTE) Tricyclics + metabolites, urine    Cutoff 1000 ng/mL Amphetamines + metabolites, urine  Cutoff 1000 ng/mL MDMA (Ecstasy), urine              Cutoff 500 ng/mL Cocaine Metabolite, urine          Cutoff 300 ng/mL Opiate + metabolites, urine        Cutoff 300 ng/mL Phencyclidine (PCP), urine         Cutoff 25 ng/mL Cannabinoid, urine                 Cutoff 50 ng/mL Barbiturates + metabolites, urine  Cutoff 200 ng/mL Benzodiazepine, urine              Cutoff 200 ng/mL Methadone, urine                   Cutoff 300 ng/mL The urine drug screen provides only a preliminary,  unconfirmed analytical test result and should not be used for non-medical purposes. Clinical consideration and professional judgment should be applied to any positive drug screen result due to possible interfering substances. A more specific alternate chemical method must be used in order to obtain a confirmed analytical result. Gas chromatography / mass spectrometry (GC/MS) is the preferred confirmat ory method. Performed at [facility], [address] [place], [place] [address]   Pregnancy, urine POC     Status: None   Collection Time: 01/07/20  1:23 PM  Result Value Ref Range   Preg Test, Ur NEGATIVE NEGATIVE    Comment:        THE SENSITIVITY OF THIS METHODOLOGY IS >24 mIU/mL   Respiratory Panel by RT PCR (Flu A&B, Covid) - Nasopharyngeal Swab     Status: None   Collection Time: 01/07/20  5:18 PM   Specimen: Nasopharyngeal Swab  Result Value Ref Range   SARS Coronavirus 2 by RT PCR NEGATIVE NEGATIVE    Comment: (NOTE) SARS-CoV-2 target nucleic acids are NOT DETECTED. The SARS-CoV-2 RNA is generally detectable in upper respiratoy specimens during  the acute phase of infection. The lowest concentration of SARS-CoV-2 viral copies this assay can detect is 131 copies/mL. A negative result does not preclude SARS-Cov-2 infection and should not be used as the sole basis for treatment or other patient management decisions. A negative result may occur with  improper specimen collection/handling, submission of specimen other than nasopharyngeal swab, presence of viral mutation(s) within the areas targeted by this assay, and inadequate number of viral copies (<131 copies/mL). A negative result must be combined with clinical observations, patient history, and epidemiological information. The expected result is Negative. Fact Sheet for Patients:  https://www.moore.com/ Fact Sheet for Healthcare Providers:  https://www.young.biz/ This  test is not yet ap proved or cleared by the [place] FDA and  has been authorized for detection and/or diagnosis of SARS-CoV-2 by FDA under an Emergency Use Authorization (EUA). This EUA will remain  in effect (meaning this test can be used) for the duration of the COVID-19 declaration under Section 564(b)(1) of the Act, 21 U.S.C. section 360bbb-3(b)(1), unless the authorization is terminated or revoked sooner.    Influenza A by PCR NEGATIVE NEGATIVE   Influenza B by PCR NEGATIVE NEGATIVE    Comment: (NOTE) The Xpert Xpress SARS-CoV-2/FLU/RSV assay is intended as an aid in  the diagnosis of influenza from Nasopharyngeal swab specimens and  should not be used as a sole basis for treatment. Nasal washings and  aspirates are unacceptable for Xpert Xpress SARS-CoV-2/FLU/RSV  testing. Fact Sheet for Patients: https://www.moore.com/ Fact Sheet for Healthcare Providers: https://www.young.biz/ This test is not yet approved or cleared by the [place] FDA and  has been authorized for detection and/or diagnosis of SARS-CoV-2 by  FDA under an Emergency Use Authorization (EUA). This EUA will remain  in effect (meaning this test can be used) for the duration of the  Covid-19 declaration under Section 564(b)(1) of the Act, 21  U.S.C. section 360bbb-3(b)(1), unless the authorization is  terminated or revoked. Performed at [facility] [address] [place], [place] [address]     Blood Alcohol level:  Lab Results  Component Value Date   [facility] <10 01/07/2020   ETH <10 11/28/2019    Metabolic Disorder Labs: Lab Results  Component Value Date   HGBA1C 5.1 11/28/2019   MPG 99.67 11/28/2019   MPG 93.93 03/27/2018   Lab Results  Component Value Date   PROLACTIN 53.4 (H) 03/28/2017   Lab Results  Component Value Date   CHOL 148 11/28/2019   TRIG 42 11/28/2019   HDL 33 (L) 11/28/2019   CHOLHDL 4.5 11/28/2019   VLDL 8 11/28/2019    LDLCALC 107 (H) 11/28/2019   LDLCALC 91 03/27/2018    Physical Findings: AIMS:  , ,  ,  ,    CIWA:    COWS:     Musculoskeletal: Strength & Muscle Tone: within normal limits Gait & Station: normal Patient leans: N/A  Psychiatric Specialty Exam: Physical Exam  Review of Systems  Blood pressure 115/62, pulse 98, temperature 98.8 F (37.1 C), temperature source Oral, resp. rate 18, height 5\' 4"  (1.626 m), weight 120.2 kg, SpO2 100 %.Body mass index is 45.49 kg/m.  General Appearance: Casual  Eye Contact:  Good  Speech:  Clear and Coherent  Volume:  Normal  Mood:  Euthymic  Affect:  Appropriate  Thought Process:  Coherent and Goal Directed  Orientation:  Full (Time, Place, and Person)  Thought Content:  Denies auditory or visual hallucinations more organized than presentation of 2/12 denies paranoid  content of thought  Suicidal Thoughts:  No  Homicidal Thoughts:  No  Memory:  Immediate;   Fair Recent;   Fair Remote;   Fair  Judgement:  Fair  Insight:  Good  Psychomotor Activity:  Normal  Concentration:  Concentration: Fair and Attention Span: Good  Recall:  [employer] of Knowledge:  Fair  Language:  Good  Akathisia:  Negative  Handed:  Right  AIMS (if indicated):     Assets:  Physical Health Resilience  ADL's:  Intact  Cognition:  WNL  Sleep:  Number of Hours: 6.75     Treatment Plan Summary: Daily contact with patient to assess and evaluate symptoms and progress in treatment and Medication management  Continue oral antipsychotic overlap with long-acting injectable aripiprazole Standard warnings basic risk-benefit side effects discussed As discussed patient has recalibrated somewhat already given the structure of hospital care and reinstitution of medication  Mykela Mewborn, MD 01/09/2020, 9:08 AM

## 2020-01-09 NOTE — Plan of Care (Signed)
Patient oriented to unit. Patient's safety is maintained on unit. Patient denies SI/HI/AVH. Patient has very little insight during assessment. Patient is pleasant and adherent to scheduled medications.   Problem: Education: Goal: Knowledge of [facility] Education information/materials will improve Outcome: Not Progressing Goal: Emotional status will improve Outcome: Not Progressing Goal: Verbalization of understanding the information provided will improve Outcome: Not Progressing

## 2020-01-09 NOTE — [facility] Group Notes (Signed)
[facility] Group Notes:  (Nursing/MHT/Case Management/Adjunct)  Date:  01/09/2020  Time:  8:47 PM  Type of Therapy:  Group Therapy  Participation Level:  Active  Participation Quality:  Appropriate  Affect:  Appropriate  Cognitive:  Alert  Insight:  Appropriate  Engagement in Group:  Engaged and Achieve good behaviors in group.  Modes of Intervention:  Support  Summary of Progress/Problems:  Tracy Poole 01/09/2020, 8:47 PM

## 2020-01-09 NOTE — Plan of Care (Signed)
  Problem: Activity: Goal: Will verbalize the importance of balancing activity with adequate rest periods 01/09/2020 0530 by Trula Ore, RN Outcome: Progressing 01/09/2020 0529 by Trula Ore, RN Outcome: Progressing  Patient verbalized the importance of rest and activity.

## 2020-01-09 NOTE — Progress Notes (Signed)
Patient alert and oriented x 4, affect is blunted , thoughts are organized and coherent no bizarre behavior noted, interacting appropriately with peers and staff, compliant with medication regimen, 15 minutes safety checks maintained will continue to monitor. 15 min ures safety checks maintained will continue to monitor.

## 2020-01-10 NOTE — Plan of Care (Signed)
  Problem: Education: Goal: Knowledge of [facility] Education information/materials will improve Outcome: Progressing Goal: Emotional status will improve Outcome: Progressing Goal: Mental status will improve Outcome: Progressing Goal: Verbalization of understanding the information provided will improve Outcome: Progressing   Problem: Safety: Goal: Periods of time without injury will increase Outcome: Progressing   Problem: Activity: Goal: Will verbalize the importance of balancing activity with adequate rest periods Outcome: Progressing   Problem: Education: Goal: Will be free of psychotic symptoms Outcome: Progressing Goal: Knowledge of the prescribed therapeutic regimen will improve Outcome: Progressing   

## 2020-01-10 NOTE — [facility] Group Notes (Signed)
LCSW Group Therapy Note  01/10/2020 1:00pm  Type of Therapy and Topic:  Group Therapy:  Cognitive Distortions  Participation Level:  Did Not Attend   Description of Group:    Patients in this group will be introduced to the topic of cognitive distortions.  Patients will identify and describe cognitive distortions, describe the feelings these distortions create for them.  Patients will identify one or more situations in their personal life where they have cognitively distorted thinking and will verbalize challenging this cognitive distortion through positive thinking skills.  Patients will practice the skill of using positive affirmations to challenge cognitive distortions using affirmation cards.    Therapeutic Goals:  1. Patient will identify two or more cognitive distortions they have used 2. Patient will identify one or more emotions that stem from use of a cognitive distortion 3. Patient will demonstrate use of a positive affirmation to counter a cognitive distortion through discussion and/or role play. 4. Patient will describe one way cognitive distortions can be detrimental to wellness   Summary of Patient Progress:   X  Therapeutic Modalities:   Cognitive Behavioral Therapy Motivational Interviewing   Mont Dajanique Robley, MSW, LCSWA Clinical Social Worker  01/10/2020 12:42 PM 

## 2020-01-10 NOTE — Progress Notes (Signed)
Patient presents with flat affect but brightens on approach. Denies any SI, HI, AVH. No psychosis noted. Patient reports wanting to go home. Complaints of cramps, tylenol given, waiting on effectiveness. Reports did not go to group today due to cramps. Comes out of room for meals. Minimal interaction with peers. Encouragement and support offered. Safety checks maintained. Medications given as prescribed. Pt receptive and remains safe on unit with q checks.

## 2020-01-10 NOTE — Progress Notes (Signed)
[facility] MD Progress Note  01/10/2020 9:40 AM Tracy Poole  MRN:  [id number] Subjective:    Tracy Poole is 26 requiring petition and involuntary admission due to paranoia and more disorganized thought and behavior, due to an exacerbation of her underlying schizophrenic condition-  She has presented more organized over the past 48 hours and she is actually requesting discharge however follow-up is not arranged in fact she states she missed her long-acting injectable aripiprazole shots due to lack of transportation, also there may have been some unrealistic plans at discharge such as staying with brother and sister which is not practical.  I am unable to reach the mother today to clarify some of these issues so we think it best to keep the patient another day but she denies thoughts of harming self or others denies paranoid thinking and denies hallucinations Principal Problem: Schizophrenia, paranoid (HCC) Diagnosis: Principal Problem:   Schizophrenia, paranoid (HCC)  Total Time spent with patient: 20 minutes  Past Psychiatric History: As above  Past Medical History:  Past Medical History:  Diagnosis Date  . Anxiety   . Asthma   . Depression   . Schizophrenia [facility])     Past Surgical History:  Procedure Laterality Date  . CESAREAN SECTION    . EUSTACHIAN TUBE DILATION    . HERNIA REPAIR     Family History:  Family History  Problem Relation Age of Onset  . Bladder Cancer Neg Hx   . Kidney cancer Neg Hx    Family Psychiatric  History: As per HPI Social History:  Social History   Substance and Sexual Activity  Alcohol Use Yes   Comment: occasionallly     Social History   Substance and Sexual Activity  Drug Use No    Social History   Socioeconomic History  . Marital status: Single    Spouse name: Not on file  . Number of children: Not on file  . Years of education: Not on file  . Highest education level: Not on file  Occupational History  . Not on file  Tobacco Use   . Smoking status: Current Every Day Smoker    Packs/day: 0.25    Types: Cigarettes  . Smokeless tobacco: Never Used  Substance and Sexual Activity  . Alcohol use: Yes    Comment: occasionallly  . Drug use: No  . Sexual activity: Yes    Birth control/protection: None  Other Topics Concern  . Not on file  Social History Narrative  . Not on file   Social Determinants of Health   Financial Resource Strain:   . Difficulty of Paying Living Expenses: Not on file  Food Insecurity:   . Worried About Programme researcher, broadcasting/film/video in the Last Year: Not on file  . Ran Out of Food in the Last Year: Not on file  Transportation Needs:   . Lack of Transportation (Medical): Not on file  . Lack of Transportation (Non-Medical): Not on file  Physical Activity:   . Days of Exercise per Week: Not on file  . Minutes of Exercise per Session: Not on file  Stress:   . Feeling of Stress : Not on file  Social Connections:   . Frequency of Communication with Friends and Family: Not on file  . Frequency of Social Gatherings with Friends and Family: Not on file  . Attends Religious Services: Not on file  . Active Member of Clubs or Organizations: Not on file  . Attends Banker Meetings: Not  on file  . Marital Status: Not on file   Additional Social History:                         Sleep: Good  Appetite:  Good  Current Medications: Current Facility-Administered Medications  Medication Dose Route Frequency Provider Last Rate Last Admin  . acetaminophen (TYLENOL) tablet 650 mg  650 mg Oral Q6H PRN Gillermo Murdoch, NP      . alum & mag hydroxide-simeth (MAALOX/MYLANTA) 200-200-20 MG/5ML suspension 30 mL  30 mL Oral Q4H PRN Gillermo Murdoch, NP      . ARIPiprazole ER (ABILIFY MAINTENA) injection 400 mg  400 mg Intramuscular Q28 days Gillermo Murdoch, NP   400 mg at 01/08/20 1111  . benztropine (COGENTIN) tablet 2 mg  2 mg Oral BID PRN Gillermo Murdoch, NP      .  haloperidol (HALDOL) tablet 5 mg  5 mg Oral BID Gillermo Murdoch, NP   5 mg at 01/10/20 0800  . magnesium hydroxide (MILK OF MAGNESIA) suspension 30 mL  30 mL Oral Daily PRN Gillermo Murdoch, NP      . nicotine (NICODERM CQ - dosed in mg/24 hours) patch 21 mg  21 mg Transdermal Daily Clapacs, John T, MD   21 mg at 01/10/20 0800  . traZODone (DESYREL) tablet 50 mg  50 mg Oral QHS PRN Gillermo Murdoch, NP   50 mg at 01/08/20 2140    Lab Results: No results found for this or any previous visit (from the past 48 hour(s)).  Blood Alcohol level:  Lab Results  Component Value Date   ETH <10 01/07/2020   ETH <10 11/28/2019    Metabolic Disorder Labs: Lab Results  Component Value Date   HGBA1C 5.1 11/28/2019   MPG 99.67 11/28/2019   MPG 93.93 03/27/2018   Lab Results  Component Value Date   PROLACTIN 53.4 (H) 03/28/2017   Lab Results  Component Value Date   CHOL 148 11/28/2019   TRIG 42 11/28/2019   HDL 33 (L) 11/28/2019   CHOLHDL 4.5 11/28/2019   VLDL 8 11/28/2019   LDLCALC 107 (H) 11/28/2019   LDLCALC 91 03/27/2018   Musculoskeletal: Strength & Muscle Tone: within normal limits Gait & Station: normal Patient leans: N/A  Psychiatric Specialty Exam: Physical Exam  Review of Systems  Blood pressure 128/77, pulse 86, temperature 98.5 F (36.9 C), temperature source Oral, resp. rate 18, height 5\' 4"  (1.626 m), weight 120.2 kg, SpO2 100 %.Body mass index is 45.49 kg/m.  General Appearance: Casual  Eye Contact:  Good  Speech:  Clear and Coherent  Volume:  Normal  Mood:  Euthymic  Affect:  Appropriate and Congruent  Thought Process:  Coherent and Goal Directed  Orientation:  Full (Time, Place, and Person)  Thought Content:  Denies auditory or visual hallucinations denies previous expressed paranoid material coherent and goal-directed overall  Suicidal Thoughts:  No  Homicidal Thoughts:  No  Memory:  Immediate;   Fair Recent;   Fair Remote;   Fair  Judgement:   Fair  Insight:  Good  Psychomotor Activity:  Normal  Concentration:  Concentration: Good and Attention Span: Good  Recall:  Good  Fund of Knowledge:  Good  Language:  Good  Akathisia:  Negative  Handed:  Right  AIMS (if indicated):     Assets:  Leisure Time Physical Health Resilience  ADL's:  Intact  Cognition:  WNL  Sleep:  Number of Hours: 6.25  Treatment Plan Summary: Daily contact with patient to assess and evaluate symptoms and progress in treatment and Medication management  Patient has received her paliperidone long-acting injectable and further she is getting the overlap of the haloperidol however she requests discharge but I am unable to reach mother to get her impression of how the patient is doing and since follow-up was an issue last time it is best to hold her through the weekend and make sure we have follow-up arranged prior to discharge so forth.  No change in precautions  Filippo Puls, MD 01/10/2020, 9:40 AM

## 2020-01-11 MED ORDER — TRAZODONE HCL 50 MG PO TABS: 50.0000 mg | ORAL_TABLET | Freq: Every evening | ORAL | 1 refills | Status: DC | PRN

## 2020-01-11 MED ORDER — ARIPIPRAZOLE ER 400 MG IM SRER: 400.0000 mg | INTRAMUSCULAR | 1 refills | Status: DC

## 2020-01-11 MED ORDER — HALOPERIDOL 5 MG PO TABS: 5.0000 mg | ORAL_TABLET | Freq: Two times a day (BID) | ORAL | 1 refills | Status: DC

## 2020-01-11 MED ORDER — BENZTROPINE MESYLATE 2 MG PO TABS: 2.0000 mg | ORAL_TABLET | Freq: Two times a day (BID) | ORAL | 1 refills | Status: DC | PRN

## 2020-01-11 NOTE — [facility] Group Notes (Signed)
[facility] Group Notes:  (Nursing/MHT/Case Management/Adjunct)  Date:  01/11/2020  Time:  10:02 AM  Type of Therapy:   Community Meeting  Participation Level:  Active  Participation Quality:  Appropriate and Drowsy  Affect:  Appropriate  Cognitive:  Alert, Appropriate and Oriented  Insight:  None  Engagement in Group:  None  Modes of Intervention:  Discussion, Education, Orientation, Socialization and Support  Summary of Progress/Problems:  Tracy Poole 01/11/2020, 10:02 AM

## 2020-01-11 NOTE — Progress Notes (Signed)
Recreation Therapy Notes  INPATIENT RECREATION TR PLAN  Patient Details Name: Tracy Poole MRN: [id number] DOB: [birth year] Today's Date: 01/11/2020  Rec Therapy Plan Is patient appropriate for Therapeutic Recreation?: Yes Treatment times per week: at least 3 Estimated Length of Stay: 5-7 days TR Treatment/Interventions: Group participation (Comment)  Discharge Criteria Pt will be discharged from therapy if:: Discharged Treatment plan/goals/alternatives discussed and agreed upon by:: Patient/family  Discharge Summary Short term goals set: Patient will engage in groups without prompting or encouragement from LRT x3 group sessions within 5 recreation therapy group sessions Short term goals met: Adequate for discharge Progress toward goals comments: Groups attended Which groups?: Goal setting, Communication Reason goals not met: N/A Therapeutic equipment acquired: N/A Reason patient discharged from therapy: Discharge from hospital Pt/family agrees with progress & goals achieved: Yes Date patient discharged from therapy: 01/11/20   Scherrie Seneca 01/11/2020, 12:14 PM

## 2020-01-11 NOTE — B[facility] Counselor (Signed)
CSW team met with the patient to discuss aftercare. Patient reports that she only wants to follow up with [facility].  CSWs asked about referral for an ACT team, providing psycho-eduation on their services.  Patient again declined stating that she only wanted to follow up with [facility].  Assunta Curtis, MSW, LCSW 01/11/2020 9:31 AM

## 2020-01-11 NOTE — Progress Notes (Addendum)
  [facility] Adult Case Management Discharge Plan :  Will you be returning to the same living situation after discharge:  Yes,  pt reports that she is returning home.  At discharge, do you have transportation home?: Yes,  pt reports that her mother will provide transportation. Do you have the ability to pay for your medications: Yes,  [place] [employer].  Release of information consent forms completed and in the chart;    Patient to Follow up at: Follow-up Information    [facility] Follow up on 01/13/2020.   Why: You have an appointment scheduled for 01/13/20 at 9:30am. It is in person. Please bring discharge paperwork with you. Thank You! Contact information: [address] [place] [address] [phone]           Next level of care provider has access to [facility] Link:no  Safety Planning and Suicide Prevention discussed: Yes,  SPE completed with the pt.   Have you used any form of tobacco in the last 30 days? (Cigarettes, Smokeless Tobacco, Cigars, and/or Pipes): Yes  Has patient been referred to the Quitline?: Patient refused referral  Patient has been referred for addiction treatment: Pt. refused referral  Mechele Dawley, LCSW 01/11/2020, 9:56 AM

## 2020-01-11 NOTE — Discharge Summary (Signed)
Physician Discharge Summary Note  Patient:  Tracy Poole is an 24 y.o., female MRN:  [id number] DOB:  1996-06-04 Patient phone:  [phone] (home)  Patient address:   [place] [address],  Total Time spent with patient: 30 minutes  Date of Admission:  01/08/2020 Date of Discharge: 01/11/20  Reason for Admission:  24 year old woman known from previous encounters.  Patient brought to the hospital under involuntary commitment papers filed by her mother that report that the patient has been more disorganized and paranoid recently.  Apparently on the day of admission she got up first thing in the morning and was taking her 61-year-old child and walking out of the house.  Principal Problem: Schizophrenia, paranoid [facility]) Discharge Diagnoses: Principal Problem:   Schizophrenia, paranoid ([place])   Past Psychiatric History: Patient has had several prior hospitalizations going back 2 or 3 years.  Seems to have developed a consistent pattern of psychotic symptoms.  Not necessarily with full mood symptoms and the diagnosis probably I think is more likely to be schizophrenia than a mood disorder.  She has shown very good response to antipsychotic medication in the past but has had poor compliance with outpatient treatment.  No history of suicide attempts no history of substance abuse  Past Medical History:  Past Medical History:  Diagnosis Date  . Anxiety   . Asthma   . Depression   . Schizophrenia [facility])     Past Surgical History:  Procedure Laterality Date  . CESAREAN SECTION    . EUSTACHIAN TUBE DILATION    . HERNIA REPAIR     Family History:  Family History  Problem Relation Age of Onset  . Bladder Cancer Neg Hx   . Kidney cancer Neg Hx    Family Psychiatric  History: None reported Social History:  Social History   Substance and Sexual Activity  Alcohol Use Yes   Comment: occasionallly     Social History   Substance and Sexual Activity  Drug Use No    Social  History   Socioeconomic History  . Marital status: Single    Spouse name: Not on file  . Number of children: Not on file  . Years of education: Not on file  . Highest education level: Not on file  Occupational History  . Not on file  Tobacco Use  . Smoking status: Current Every Day Smoker    Packs/day: 0.25    Types: Cigarettes  . Smokeless tobacco: Never Used  Substance and Sexual Activity  . Alcohol use: Yes    Comment: occasionallly  . Drug use: No  . Sexual activity: Yes    Birth control/protection: None  Other Topics Concern  . Not on file  Social History Narrative  . Not on file   Social Determinants of Health   Financial Resource Strain:   . Difficulty of Paying Living Expenses: Not on file  Food Insecurity:   . Worried About Charity fundraiser in the Last Year: Not on file  . Ran Out of Food in the Last Year: Not on file  Transportation Needs:   . Lack of Transportation (Medical): Not on file  . Lack of Transportation (Non-Medical): Not on file  Physical Activity:   . Days of Exercise per Week: Not on file  . Minutes of Exercise per Session: Not on file  Stress:   . Feeling of Stress : Not on file  Social Connections:   . Frequency of Communication with Friends and Family:  Not on file  . Frequency of Social Gatherings with Friends and Family: Not on file  . Attends Religious Services: Not on file  . Active Member of Clubs or Organizations: Not on file  . Attends Banker Meetings: Not on file  . Marital Status: Not on file    Hospital Course:  Patient remained on the [facility] unit for 3 days. The patient stabilized on medication and therapy. Patient was discharged on Abilify maintain a 400 mg IM every 28 days with next dose due on 02/05/2020, Cogentin 2 mg p.o. twice daily as needed, Haldol 5 mg p.o. twice daily, trazodone 50 mg p.o. nightly as needed. Patient has shown improvement with improved mood, affect, sleep, appetite, and interaction. Patient  has attended group and participated. Patient has been seen in the day room interacting with peers and staff appropriately. Patient denies any SI/HI/AVH and contracts for safety. Patient agrees to follow up at [facility]. Patient is provided with prescriptions for their medications upon discharge.  Physical Findings: AIMS:  , ,  ,  ,    CIWA:    COWS:     Musculoskeletal: Strength & Muscle Tone: within normal limits Gait & Station: normal Patient leans: N/A  Psychiatric Specialty Exam: Physical Exam  Nursing note and vitals reviewed. Constitutional: She is oriented to person, place, and time. She appears well-developed and well-nourished.  Cardiovascular: Normal rate.  Respiratory: Effort normal.  Musculoskeletal:        General: Normal range of motion.  Neurological: She is alert and oriented to person, place, and time.  Skin: Skin is warm.    Review of Systems  Constitutional: Negative.   HENT: Negative.   Eyes: Negative.   Respiratory: Negative.   Cardiovascular: Negative.   Gastrointestinal: Negative.   Genitourinary: Negative.   Musculoskeletal: Negative.   Skin: Negative.   Neurological: Negative.   Psychiatric/Behavioral: Negative.     Blood pressure 137/75, pulse 87, temperature 98.4 F (36.9 C), temperature source Oral, resp. rate 17, height 5\' 4"  (1.626 m), weight 120.2 kg, SpO2 100 %.Body mass index is 45.49 kg/m.   General Appearance: Casual  Eye Contact::  Fair  Speech:  Normal Rate409  Volume:  Normal  Mood:  Euthymic  Affect:  Congruent  Thought Process:  Coherent  Orientation:  Full (Time, Place, and Person)  Thought Content:  Logical  Suicidal Thoughts:  No  Homicidal Thoughts:  No  Memory:  Immediate;   Fair Recent;   Fair Remote;   Fair  Judgement:  Fair  Insight:  Fair  Psychomotor Activity:  Normal  Concentration:  Fair  Recall:  [phone] of Knowledge:Fair  Language: Fair  Akathisia:  No  Handed:  Right  AIMS (if indicated):     Assets:   Desire for Improvement Housing Physical Health Resilience Social Support  Sleep:  Number of Hours: 5.75  Cognition: WNL  ADL's:  Intact   Have you used any form of tobacco in the last 30 days? (Cigarettes, Smokeless Tobacco, Cigars, and/or Pipes): Yes  Has this patient used any form of tobacco in the last 30 days? (Cigarettes, Smokeless Tobacco, Cigars, and/or Pipes) Yes, Yes, A prescription for an FDA-approved tobacco cessation medication was offered at discharge and the patient refused  Blood Alcohol level:  Lab Results  Component Value Date   [facility] <10 01/07/2020   ETH <10 11/28/2019    Metabolic Disorder Labs:  Lab Results  Component Value Date   HGBA1C 5.1 11/28/2019  MPG 99.67 11/28/2019   MPG 93.93 03/27/2018   Lab Results  Component Value Date   PROLACTIN 53.4 (H) 03/28/2017   Lab Results  Component Value Date   CHOL 148 11/28/2019   TRIG 42 11/28/2019   HDL 33 (L) 11/28/2019   CHOLHDL 4.5 11/28/2019   VLDL 8 11/28/2019   LDLCALC 107 (H) 11/28/2019   LDLCALC 91 03/27/2018    See Psychiatric Specialty Exam and Suicide Risk Assessment completed by Attending Physician prior to discharge.  Discharge destination:  Home  Is patient on multiple antipsychotic therapies at discharge:  No   Has Patient had three or more failed trials of antipsychotic monotherapy by history:  No  Recommended Plan for Multiple Antipsychotic Therapies: NA  Discharge Instructions    Diet - low sodium heart healthy   Complete by: As directed    Increase activity slowly   Complete by: As directed      Allergies as of 01/11/2020   No Known Allergies     Medication List    TAKE these medications     Indication  ARIPiprazole ER 400 MG Srer injection Commonly known as: ABILIFY MAINTENA Inject 2 mLs (400 mg total) into the muscle every 28 (twenty-eight) days. Start taking on: February 05, 2020 What changed: additional instructions  Indication: Schizophrenia   benztropine 2 MG  tablet Commonly known as: COGENTIN Take 1 tablet (2 mg total) by mouth 2 (two) times daily as needed for tremors (spitting).  Indication: Extrapyramidal Reaction caused by Medications   haloperidol 5 MG tablet Commonly known as: HALDOL Take 1 tablet (5 mg total) by mouth 2 (two) times daily.  Indication: Psychosis   traZODone 50 MG tablet Commonly known as: DESYREL Take 1 tablet (50 mg total) by mouth at bedtime as needed for sleep.  Indication: Trouble Sleeping      Follow-up Information    [facility] Follow up.   Why: You are scheduled to meet with Unk Pinto via zoom on Thursday, February 18th at 7am. Thank you. Contact information: [address] [place] [address] [phone]           Follow-up recommendations:  Continue activity as tolerated. Continue diet as recommended by your PCP. Ensure to keep all appointments with outpatient providers.  Comments:  Patient is instructed prior to discharge to: Take all medications as prescribed by his/her mental healthcare provider. Report any adverse effects and or reactions from the medicines to his/her outpatient provider promptly. Patient has been instructed & cautioned: To not engage in alcohol and or illegal drug use while on prescription medicines. In the event of worsening symptoms, patient is instructed to call the crisis hotline, 911 and or go to the nearest ED for appropriate evaluation and treatment of symptoms. To follow-up with his/her primary care provider for your other medical issues, concerns and or health care needs.    Signed: Gerlene Burdock Idelle Reimann, FNP 01/11/2020, 9:44 AM

## 2020-01-11 NOTE — Plan of Care (Signed)
Pt denies depression, anxiety, SI, HI and AVH. Pt was educated on care plan and verbalizes understanding. Pranathi Winfree RN Problem: Education: Goal: Knowledge of [facility] Education information/materials will improve Outcome: Adequate for Discharge Goal: Emotional status will improve Outcome: Adequate for Discharge Goal: Mental status will improve Outcome: Adequate for Discharge Goal: Verbalization of understanding the information provided will improve Outcome: Adequate for Discharge   Problem: Safety: Goal: Periods of time without injury will increase Outcome: Adequate for Discharge   Problem: Activity: Goal: Will verbalize the importance of balancing activity with adequate rest periods Outcome: Adequate for Discharge   Problem: Education: Goal: Will be free of psychotic symptoms Outcome: Adequate for Discharge Goal: Knowledge of the prescribed therapeutic regimen will improve Outcome: Adequate for Discharge   

## 2020-01-11 NOTE — Progress Notes (Signed)
Recreation Therapy Notes   Date: 01/11/2020  Time: 9:30 am  Location: Craft Room  Behavioral response: Not Engaged   Intervention Topic: Goals  Discussion/Intervention:  Group content on today was focused on goals. Patients described what goals are and how they define goals. Individuals expressed how they go about setting goals and reaching them. The group identified how important goals are and if they make short term goals to reach long term goals. Patients described how many goals they work on at a time and what affects them not reaching their goal. Individuals described how much time they put into planning and obtaining their goals. The group participated in the intervention "My Goal Board" and made personal goal boards to help them achieve their goal. Clinical Observations/Feedback:  Patient came to group to sleep. Allon Costlow LRT/CTRS         Elanna Bert 01/11/2020 12:11 PM

## 2020-01-11 NOTE — Progress Notes (Signed)
Patient was seen sleeping in the dayroom as well as interacting with her peers at times. She had to be instructed to go to bed. The patient seems to be paranoid about sleeping in her room. The patient did sleep all night.

## 2020-01-11 NOTE — Progress Notes (Signed)
Pt denies SI, HI and AVH. Pt received transition packet, belongings and prescriptions. Pt was educated on dc plan and verbalizes understanding. Torrie Mayers RN

## 2020-01-11 NOTE — [facility] Suicide Risk Assessment (Signed)
[facility] Discharge Suicide Risk Assessment   Principal Problem: Schizophrenia, paranoid [facility]) Discharge Diagnoses: Principal Problem:   Schizophrenia, paranoid (HCC)   Total Time spent with patient: 30 minutes  Musculoskeletal: Strength & Muscle Tone: within normal limits Gait & Station: normal Patient leans: N/A  Psychiatric Specialty Exam: Review of Systems  Constitutional: Negative.   HENT: Negative.   Eyes: Negative.   Respiratory: Negative.   Cardiovascular: Negative.   Gastrointestinal: Negative.   Musculoskeletal: Negative.   Skin: Negative.   Neurological: Negative.   Psychiatric/Behavioral: Negative.     Blood pressure 137/75, pulse 87, temperature 98.4 F (36.9 C), temperature source Oral, resp. rate 17, height 5\' 4"  (1.626 m), weight 120.2 kg, SpO2 100 %.Body mass index is 45.49 kg/m.  General Appearance: Casual  Eye Contact::  Fair  Speech:  Normal Rate409  Volume:  Normal  Mood:  Euthymic  Affect:  Congruent  Thought Process:  Coherent  Orientation:  Full (Time, Place, and Person)  Thought Content:  Logical  Suicidal Thoughts:  No  Homicidal Thoughts:  No  Memory:  Immediate;   Fair Recent;   Fair Remote;   Fair  Judgement:  Fair  Insight:  Fair  Psychomotor Activity:  Normal  Concentration:  Fair  Recall:  [phone] of Knowledge:Fair  Language: Fair  Akathisia:  No  Handed:  Right  AIMS (if indicated):     Assets:  Desire for Improvement Housing Physical Health Resilience Social Support  Sleep:  Number of Hours: 5.75  Cognition: WNL  ADL's:  Intact   Mental Status Per Nursing Assessment::   On Admission:  NA  Demographic Factors:  NA  Loss Factors: Financial problems/change in socioeconomic status  Historical Factors: Impulsivity  Risk Reduction Factors:   Responsible for children under 37 years of age, Sense of responsibility to family, Religious beliefs about death, Positive social support and Positive therapeutic  relationship  Continued Clinical Symptoms:  Bipolar Disorder:   Mixed State Schizophrenia:   Less than 49 years old  Cognitive Features That Contribute To Risk:  None    Suicide Risk:  Minimal: No identifiable suicidal ideation.  Patients presenting with no risk factors but with morbid ruminations; may be classified as minimal risk based on the severity of the depressive symptoms  Follow-up Information    [facility] Follow up.   Why: You are scheduled to meet with 41 via zoom on Thursday, February 18th at 7am. Thank you. Contact information: [address] [place] [phone]           Plan Of Care/Follow-up recommendations:  Activity:  Activity as tolerated Diet:  Regular diet Other:  Follow-up with [facility] continue current medication and therapy.  [phone], MD 01/11/2020, 9:15 AM

## 2020-01-11 NOTE — [facility] Group Notes (Signed)
[facility] Group Notes:  (Nursing/MHT/Case Management/Adjunct)  Date:  01/11/2020  Time:  11:26 AM  Type of Therapy:  Psychoeducational Skills  Participation Level:  Did Not Attend  Lynelle Smoke [facility] 01/11/2020, 11:26 AM

## 2020-01-11 NOTE — Plan of Care (Signed)
  Problem: Group Participation °Goal: STG - Patient will engage in groups without prompting or encouragement from LRT x3 group sessions within 5 recreation therapy group sessions °Description: STG - Patient will engage in groups without prompting or encouragement from LRT x3 group sessions within 5 recreation therapy group sessions °Outcome: Adequate for Discharge °  °

## 2020-01-21 IMAGING — CR DG LUMBAR SPINE COMPLETE 4+V
1 series · 5 of 5 positions shown · non-contrast
Comparison: None.

CLINICAL DATA: Low back pain since an unspecified injury
06/29/2018. Initial encounter.

EXAM:
LUMBAR SPINE - COMPLETE 4+ VIEW

[Series 1: dg lumbar spine complete 4 +v · 0.14mm/px · 5 of 5 slices shown]
[im 1/5]
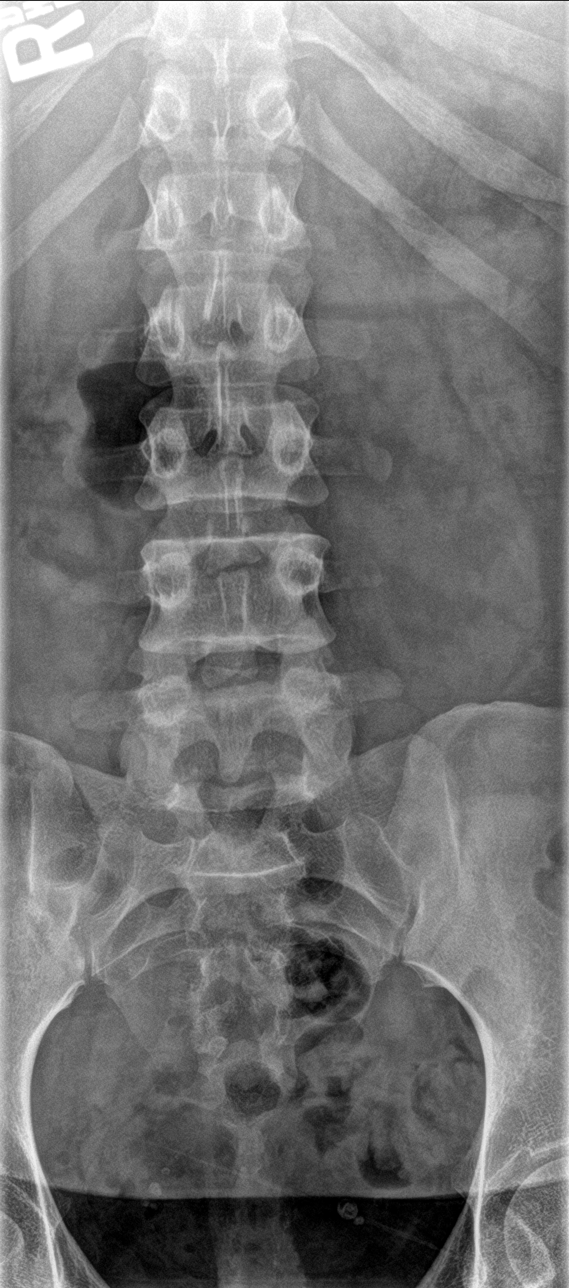
[im 2/5]
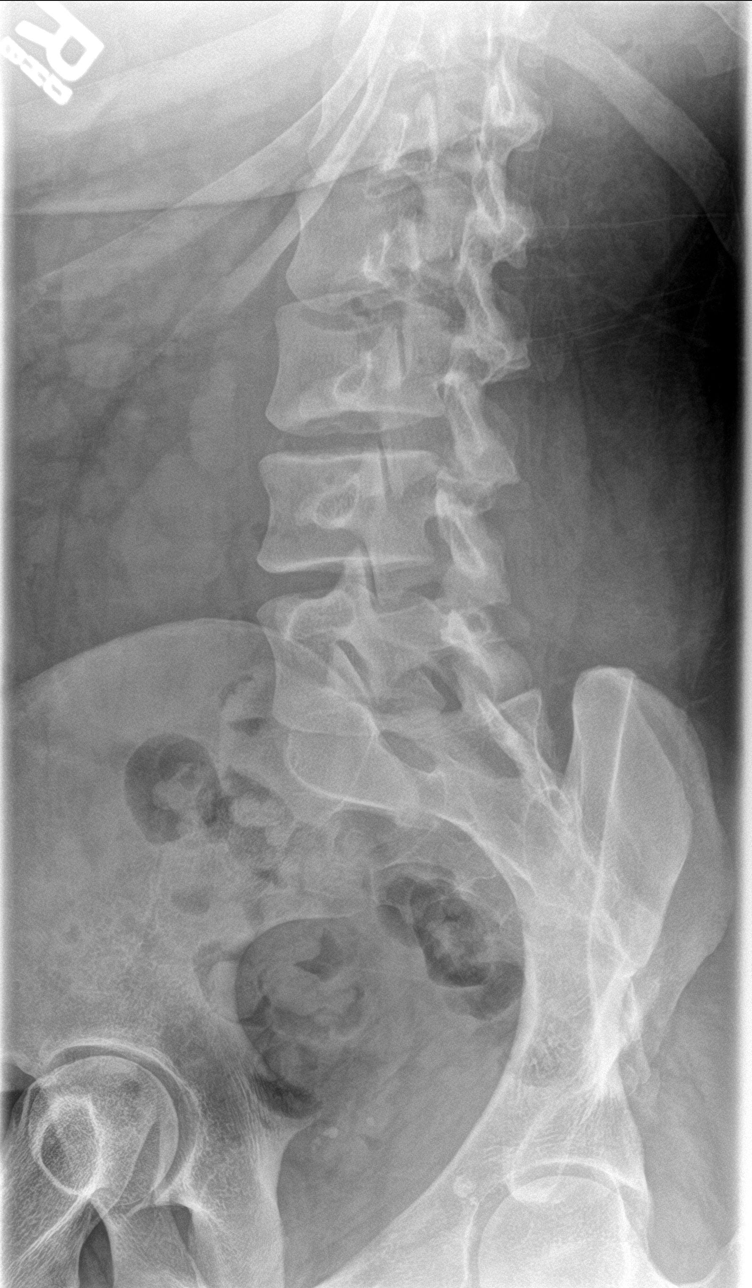
[im 3/5]
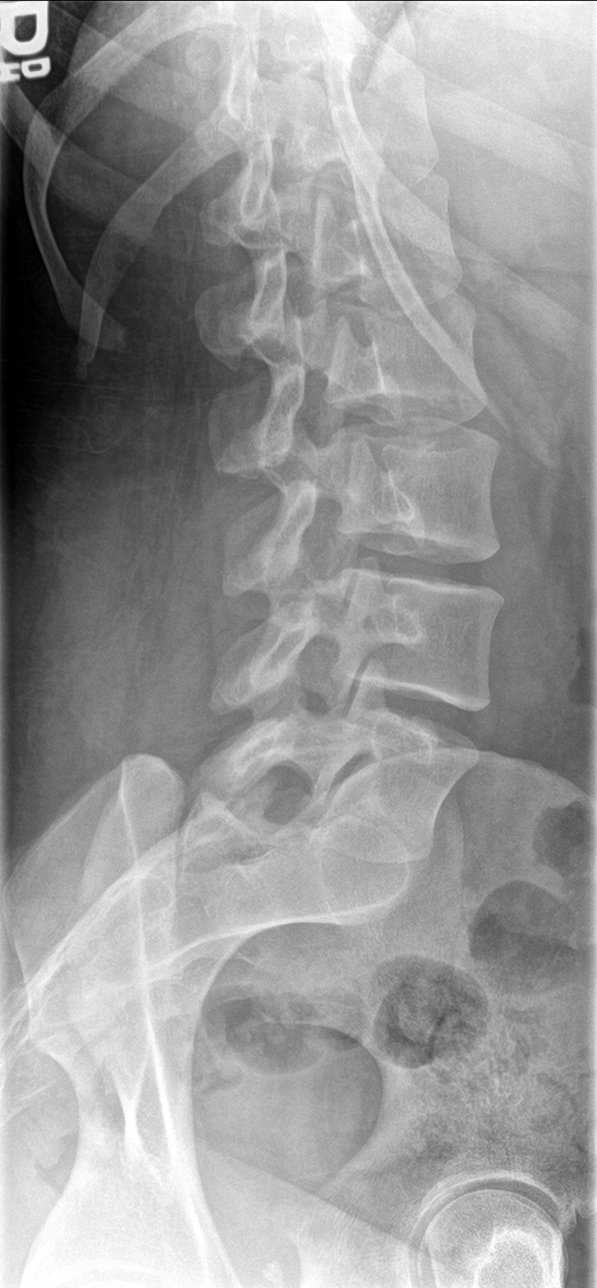
[im 4/5]
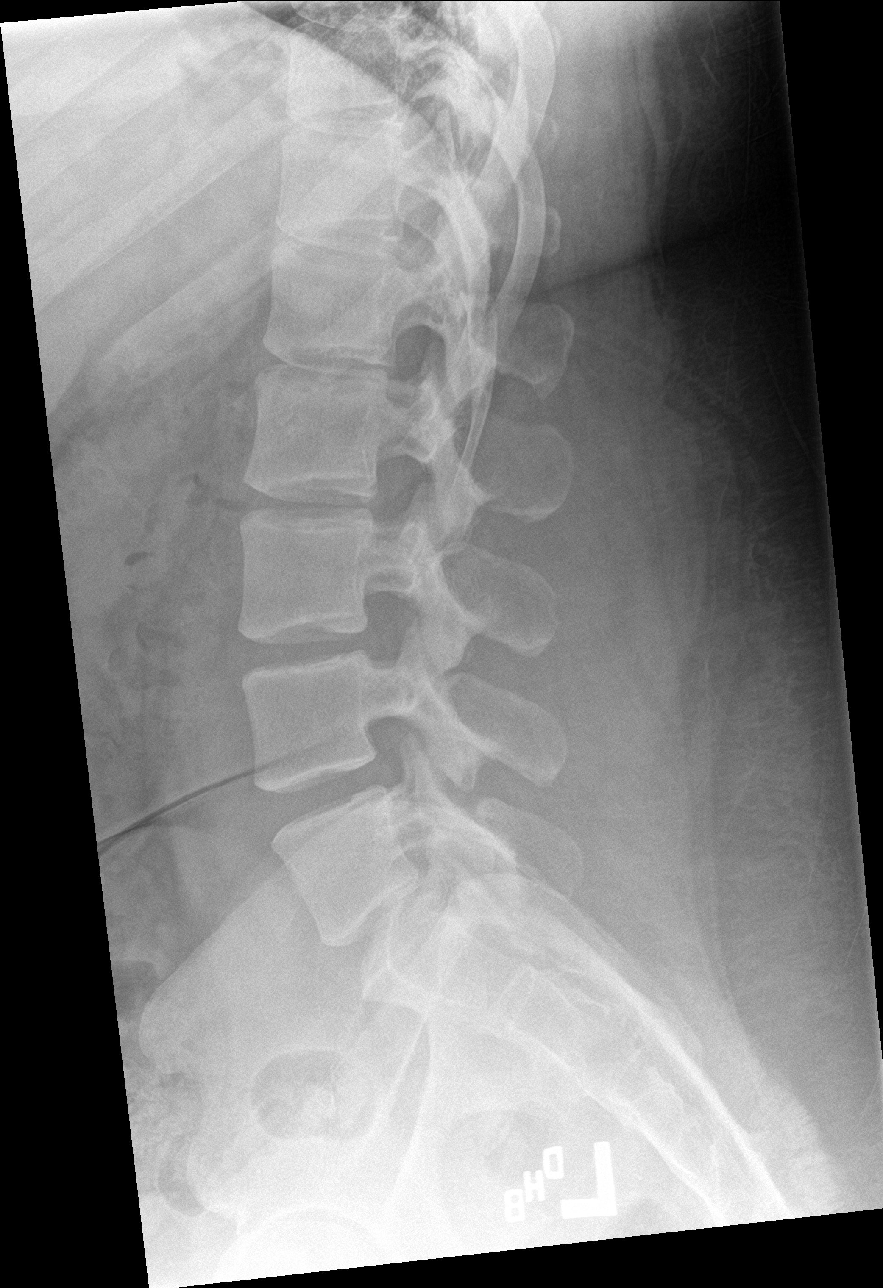
[im 5/5]
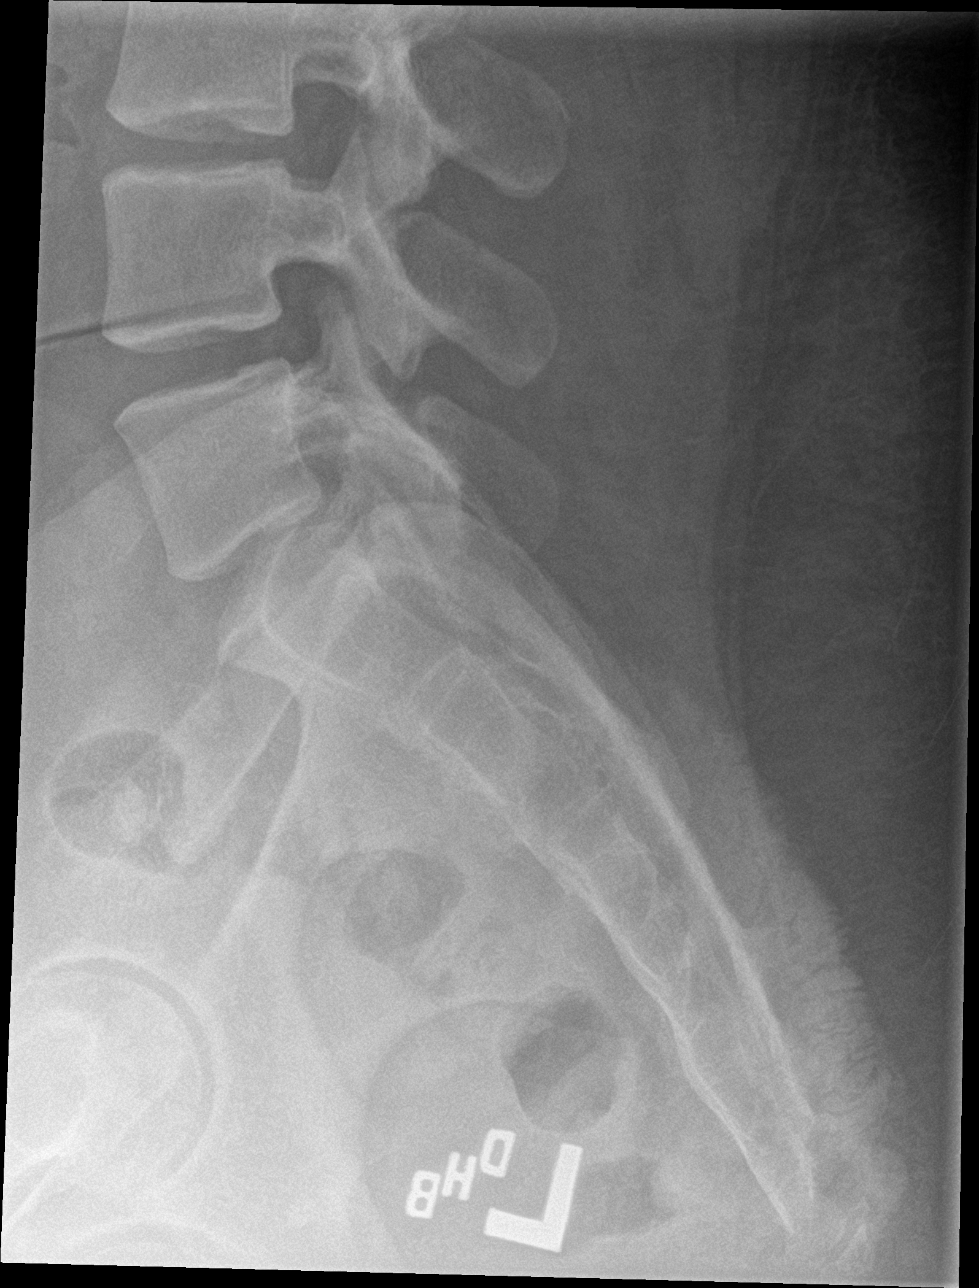

[5 of 5 positions shown; findings below may reference images not displayed]

FINDINGS: There is no evidence of lumbar spine fracture. Alignment is normal.
Intervertebral disc spaces are maintained.
IMPRESSION: Normal examination.

## 2020-11-01 ENCOUNTER — Other Ambulatory Visit: Payer: Self-pay

## 2020-11-01 ENCOUNTER — Emergency Department
Admission: EM | Admit: 2020-11-01 | Discharge: 2020-11-02 | Disposition: A | Discharge status: Other Acute Inpatient Hospital | Payer: Medicaid Other | Attending: Student in an Organized Health Care Education/Training Program | Admitting: Student in an Organized Health Care Education/Training Program

## 2020-11-01 DIAGNOSIS — J45909 Unspecified asthma, uncomplicated: Secondary | ICD-10-CM | POA: Insufficient documentation

## 2020-11-01 DIAGNOSIS — Z9114 Patient's other noncompliance with medication regimen: Secondary | ICD-10-CM

## 2020-11-01 DIAGNOSIS — R462 Strange and inexplicable behavior: Secondary | ICD-10-CM | POA: Diagnosis not present

## 2020-11-01 DIAGNOSIS — Z20822 Contact with and (suspected) exposure to covid-19: Secondary | ICD-10-CM | POA: Diagnosis not present

## 2020-11-01 DIAGNOSIS — Z87891 Personal history of nicotine dependence: Secondary | ICD-10-CM | POA: Insufficient documentation

## 2020-11-01 DIAGNOSIS — F319 Bipolar disorder, unspecified: Secondary | ICD-10-CM | POA: Diagnosis not present

## 2020-11-01 DIAGNOSIS — Z91148 Patient's other noncompliance with medication regimen for other reason: Secondary | ICD-10-CM

## 2020-11-01 LAB — CBC
HCT: 40 % (ref 36.0–46.0)
Hemoglobin: 13.2 g/dL (ref 12.0–15.0)
MCH: 28.2 pg (ref 26.0–34.0)
MCHC: 33 g/dL (ref 30.0–36.0)
MCV: 85.5 fL (ref 80.0–100.0)
Platelets: 216 10*3/uL (ref 150–400)
RBC: 4.68 MIL/uL (ref 3.87–5.11)
RDW: 14.6 % (ref 11.5–15.5)
WBC: 11 10*3/uL — ABNORMAL HIGH (ref 4.0–10.5)
nRBC: 0 % (ref 0.0–0.2)

## 2020-11-01 LAB — URINE DRUG SCREEN, QUALITATIVE (ARMC ONLY)
Amphetamines, Ur Screen: NOT DETECTED
Barbiturates, Ur Screen: NOT DETECTED
Benzodiazepine, Ur Scrn: NOT DETECTED
Cannabinoid 50 Ng, Ur ~~LOC~~: NOT DETECTED
Cocaine Metabolite,Ur ~~LOC~~: NOT DETECTED
MDMA (Ecstasy)Ur Screen: NOT DETECTED
Methadone Scn, Ur: NOT DETECTED
Opiate, Ur Screen: NOT DETECTED
Phencyclidine (PCP) Ur S: NOT DETECTED
Tricyclic, Ur Screen: NOT DETECTED

## 2020-11-01 LAB — COMPREHENSIVE METABOLIC PANEL
ALT: 39 U/L (ref 0–44)
AST: 24 U/L (ref 15–41)
Albumin: 4.1 g/dL (ref 3.5–5.0)
Alkaline Phosphatase: 77 U/L (ref 38–126)
Anion gap: 8 (ref 5–15)
BUN: 10 mg/dL (ref 6–20)
CO2: 23 mmol/L (ref 22–32)
Calcium: 9.2 mg/dL (ref 8.9–10.3)
Chloride: 106 mmol/L (ref 98–111)
Creatinine, Ser: 0.85 mg/dL (ref 0.44–1.00)
GFR, Estimated: >60 mL/min (ref >60)
Glucose, Bld: 96 mg/dL (ref 70–99)
Potassium: 3.8 mmol/L (ref 3.5–5.1)
Sodium: 137 mmol/L (ref 135–145)
Total Bilirubin: 0.8 mg/dL (ref 0.3–1.2)
Total Protein: 7.8 g/dL (ref 6.5–8.1)

## 2020-11-01 LAB — SALICYLATE LEVEL: Salicylate Lvl: <7 mg/dL — ABNORMAL LOW (ref 7.0–30.0)

## 2020-11-01 LAB — PREGNANCY, URINE: Preg Test, Ur: NEGATIVE

## 2020-11-01 LAB — RESP PANEL BY RT-PCR (FLU A&B, COVID) ARPGX2
Influenza A by PCR: NEGATIVE
Influenza B by PCR: NEGATIVE
SARS Coronavirus 2 by RT PCR: NEGATIVE

## 2020-11-01 LAB — ETHANOL: Alcohol, Ethyl (B): <10 mg/dL (ref <10)

## 2020-11-01 LAB — POC URINE PREG, ED: Preg Test, Ur: NEGATIVE

## 2020-11-01 LAB — ACETAMINOPHEN LEVEL: Acetaminophen (Tylenol), Serum: <10 ug/mL — ABNORMAL LOW (ref 10–30)

## 2020-11-01 NOTE — ED Notes (Signed)
Hourly rounding completed at this time, patient currently awake in room. No complaints, stable, and in no acute distress. Q15 minute rounds and monitoring via Rover and Officer to continue. °

## 2020-11-01 NOTE — ED Provider Notes (Signed)
[facility] Emergency Department Provider Note    First MD Initiated Contact with Patient 11/01/20 1830     (approximate)  I have reviewed the triage vital signs and the nursing notes.   HISTORY  Chief Complaint IVC    HPI Tracy Poole is a 25 y.o. female history of schizophrenia presents to the ER for psychiatric evaluation.  Patient presents under IVC venogram PD.  Patient was reportedly becoming threatening towards her family members was threatening to harm her mother.  Making comments about being "Christ Brown's" girlfriend stating that she is going to [place] daily.  Was outside on a cold evening with her 8-year-old daughter and avoiding the police.  Family was were concerned that she was a danger to herself and her child therefore IVC paperwork is taken out.    Past Medical History:  Diagnosis Date  . Anxiety   . Asthma   . Depression   . Schizophrenia (HCC)    Family History  Problem Relation Age of Onset  . Bladder Cancer Neg Hx   . Kidney cancer Neg Hx    Past Surgical History:  Procedure Laterality Date  . CESAREAN SECTION    . EUSTACHIAN TUBE DILATION    . HERNIA REPAIR     Patient Active Problem List   Diagnosis Date Noted  . Bipolar 1 disorder (HCC)   . Delusion (HCC)   . Schizophrenia, paranoid type (HCC) 11/28/2019  . Evaluation by psychiatric service required   . Noncompliance with medications 03/25/2018  . Pregnancy 09/29/2017  . Pregnancy affected by fetal growth restriction 09/05/2017  . Abnormal findings on antenatal screening   . First trimester pregnancy 03/27/2017  . Schizophrenia, paranoid (HCC) 03/27/2017      Prior to Admission medications   Medication Sig Start Date End Date Taking? Authorizing Provider  ARIPiprazole ER (ABILIFY MAINTENA) 400 MG SRER injection Inject 2 mLs (400 mg total) into the muscle every 28 (twenty-eight) days. 02/05/20   Money, Gerlene Burdock, FNP  benztropine (COGENTIN) 2 MG tablet Take 1  tablet (2 mg total) by mouth 2 (two) times daily as needed for tremors (spitting). 01/11/20   Money, Gerlene Burdock, FNP  haloperidol (HALDOL) 5 MG tablet Take 1 tablet (5 mg total) by mouth 2 (two) times daily. 01/11/20   Money, Gerlene Burdock, FNP  traZODone (DESYREL) 50 MG tablet Take 1 tablet (50 mg total) by mouth at bedtime as needed for sleep. 01/11/20   Money, Gerlene Burdock, FNP    Allergies Patient has no known allergies.    Social History Social History   Tobacco Use  . Smoking status: Former Smoker    Packs/day: 0.25    Types: Cigarettes  . Smokeless tobacco: Never Used  Vaping Use  . Vaping Use: Never used  Substance Use Topics  . Alcohol use: Yes    Comment: occasionallly  . Drug use: No    Review of Systems Patient denies headaches, rhinorrhea, blurry vision, numbness, shortness of breath, chest pain, edema, cough, abdominal pain, nausea, vomiting, diarrhea, dysuria, fevers, rashes or hallucinations unless otherwise stated above in HPI. ____________________________________________   PHYSICAL EXAM:  VITAL SIGNS: Vitals:   11/01/20 1815  BP: 134/74  Pulse: (!) 113  Resp: 16  Temp: 98.8 F (37.1 C)  SpO2: 100%    Constitutional: Alert and oriented.  Eyes: Conjunctivae are normal.  Head: Atraumatic. Nose: No congestion/rhinnorhea. Mouth/Throat: Mucous membranes are moist.   Neck: No stridor. Painless ROM.  Cardiovascular: Normal rate,  regular rhythm. Grossly normal heart sounds.  Good peripheral circulation. Respiratory: Normal respiratory effort.  No retractions. Lungs CTAB. Gastrointestinal: Soft and nontender. No distention. No abdominal bruits. No CVA tenderness. Genitourinary:  Musculoskeletal: No lower extremity tenderness nor edema.  No joint effusions. Neurologic:  Normal speech and language. No gross focal neurologic deficits are appreciated. No facial droop Skin:  Skin is warm, dry and intact. No rash noted. Psychiatric:calm,  cooperative ____________________________________________   LABS (all labs ordered are listed, but only abnormal results are displayed)  Results for orders placed or performed during the hospital encounter of 11/01/20 (from the past 24 hour(s))  Comprehensive metabolic panel     Status: None   Collection Time: 11/01/20  6:17 PM  Result Value Ref Range   Sodium 137 135 - 145 mmol/L   Potassium 3.8 3.5 - 5.1 mmol/L   Chloride 106 98 - 111 mmol/L   CO2 23 22 - 32 mmol/L   Glucose, Bld 96 70 - 99 mg/dL   BUN 10 6 - 20 mg/dL   Creatinine, Ser 1.66 0.44 - 1.00 mg/dL   Calcium 9.2 8.9 - 06.3 mg/dL   Total Protein 7.8 6.5 - 8.1 g/dL   Albumin 4.1 3.5 - 5.0 g/dL   AST 24 15 - 41 U/L   ALT 39 0 - 44 U/L   Alkaline Phosphatase 77 38 - 126 U/L   Total Bilirubin 0.8 0.3 - 1.2 mg/dL   GFR, Estimated >01 >60 mL/min   Anion gap 8 5 - 15  Ethanol     Status: None   Collection Time: 11/01/20  6:17 PM  Result Value Ref Range   Alcohol, Ethyl (B) <10 <10 mg/dL  Salicylate level     Status: Abnormal   Collection Time: 11/01/20  6:17 PM  Result Value Ref Range   Salicylate Lvl <7.0 (L) 7.0 - 30.0 mg/dL  Acetaminophen level     Status: Abnormal   Collection Time: 11/01/20  6:17 PM  Result Value Ref Range   Acetaminophen (Tylenol), Serum <10 (L) 10 - 30 ug/mL  cbc     Status: Abnormal   Collection Time: 11/01/20  6:17 PM  Result Value Ref Range   WBC 11.0 (H) 4.0 - 10.5 K/uL   RBC 4.68 3.87 - 5.11 MIL/uL   Hemoglobin 13.2 12.0 - 15.0 g/dL   HCT 10.9 36 - 46 %   MCV 85.5 80.0 - 100.0 fL   MCH 28.2 26.0 - 34.0 pg   MCHC 33.0 30.0 - 36.0 g/dL   RDW 32.3 55.7 - 32.2 %   Platelets 216 150 - 400 K/uL   nRBC 0.0 0.0 - 0.2 %  Urine Drug Screen, Qualitative     Status: None   Collection Time: 11/01/20  6:19 PM  Result Value Ref Range   Tricyclic, Ur Screen NONE DETECTED NONE DETECTED   Amphetamines, Ur Screen NONE DETECTED NONE DETECTED   MDMA (Ecstasy)Ur Screen NONE DETECTED NONE DETECTED    Cocaine Metabolite,Ur Scurry NONE DETECTED NONE DETECTED   Opiate, Ur Screen NONE DETECTED NONE DETECTED   Phencyclidine (PCP) Ur S NONE DETECTED NONE DETECTED   Cannabinoid 50 Ng, Ur  NONE DETECTED NONE DETECTED   Barbiturates, Ur Screen NONE DETECTED NONE DETECTED   Benzodiazepine, Ur Scrn NONE DETECTED NONE DETECTED   Methadone Scn, Ur NONE DETECTED NONE DETECTED   ____________________________________________ ____________________________________________  RADIOLOGY  ____________________________________________   PROCEDURES  Procedure(s) performed:  Procedures    Critical Care performed: no ____________________________________________  INITIAL IMPRESSION / ASSESSMENT AND PLAN / ED COURSE  Pertinent labs & imaging results that were available during my care of the patient were reviewed by me and considered in my medical decision making (see chart for details).   DDX: Psychosis, delirium, medication effect, noncompliance, polysubstance abuse, Si, Hi, depression  Tracy Poole is a 24 y.o. who presents to the ED with for evaluation of bizarre and threatening behavior.  Patient has psych history of schizophrenia.  Laboratory testing was ordered to evaluation for underlying electrolyte derangement or signs of underlying organic pathology to explain today's presentation.  Based on history and physical and laboratory evaluation, it appears that the patient's presentation is 2/2 underlying psychiatric disorder and will require further evaluation and management by inpatient psychiatry.  Patient was made an IVC.  Disposition pending psychiatric evaluation.  The patient has been placed in psychiatric observation due to the need to provide a safe environment for the patient while obtaining psychiatric consultation and evaluation, as well as ongoing medical and medication management to treat the patient's condition.  The patient has been placed under full IVC at this time.     The  patient was evaluated in Emergency Department today for the symptoms described in the history of present illness. He/she was evaluated in the context of the global COVID-19 pandemic, which necessitated consideration that the patient might be at risk for infection with the SARS-CoV-2 virus that causes COVID-19. Institutional protocols and algorithms that pertain to the evaluation of patients at risk for COVID-19 are in a state of rapid change based on information released by regulatory bodies including the CDC and federal and state organizations. These policies and algorithms were followed during the patient's care in the ED.  As part of my medical decision making, I reviewed the following data within the electronic MEDICAL RECORD NUMBER Nursing notes reviewed and incorporated, Labs reviewed, notes from prior ED visits and [place] Controlled Substance Database   ____________________________________________   FINAL CLINICAL IMPRESSION(S) / ED DIAGNOSES  Final diagnoses:  Bizarre behavior      NEW MEDICATIONS STARTED DURING THIS VISIT:  New Prescriptions   No medications on file     Note:  This document was prepared using Dragon voice recognition software and may include unintentional dictation errors.    Willy Eddy, MD 11/01/20 [phone]

## 2020-11-01 NOTE — ED Notes (Addendum)
PT dressed into hospital scrubs by this RN and Brayton Caves NT. Belongings placed in bag include striped dress, striped sock, patterned sock,  Blue head wrap.

## 2020-11-01 NOTE — ED Notes (Signed)
Pt uninterested in communication with staff, wants to go home and will cut off conversation to ask when she can leave. Sitting on bed watching TV

## 2020-11-01 NOTE — ED Triage Notes (Signed)
PT to ED under IVC with [place] [employer]. Per pt, she is here to get some kind of shot. Per IVC paperwork, pt was threatening her mother, thinks she is christ brown's girlfriend and she goes to [place] every night, she also had her child out in the cold without proper clothing. Paperwork states pt is danger to self and child.  PT cooperative at this time.

## 2020-11-01 NOTE — ED Notes (Signed)
Pt. Got a Snack and a Drink.

## 2020-11-01 NOTE — ED Notes (Signed)
Crackers and peanut butter was given.

## 2020-11-01 NOTE — ED Notes (Signed)
Report received from Jeannette, RN including Situation, Background, Assessment, and Recommendations. Patient alert and oriented, warm and dry, and in no acute distress. Patient denies SI, HI, AVH and pain. Patient made aware of Q15 minute rounds and Rover and Officer presence for their safety. Patient instructed to come to this nurse with needs or concerns. 

## 2020-11-02 ENCOUNTER — Other Ambulatory Visit: Payer: Self-pay

## 2020-11-02 ENCOUNTER — Encounter: Payer: Self-pay | Admitting: Psychiatry

## 2020-11-02 ENCOUNTER — Inpatient Hospital Stay
Admission: AD | Admit: 2020-11-02 | Discharge: 2020-11-07 | DRG: 885 | Disposition: A | Discharge status: Home / Self Care | Payer: Medicaid Other | Source: Intra-hospital | Attending: Behavioral Health | Admitting: Behavioral Health

## 2020-11-02 DIAGNOSIS — R462 Strange and inexplicable behavior: Secondary | ICD-10-CM | POA: Diagnosis not present

## 2020-11-02 DIAGNOSIS — F250 Schizoaffective disorder, bipolar type: Principal | ICD-10-CM | POA: Diagnosis present

## 2020-11-02 DIAGNOSIS — Z9119 Patient's noncompliance with other medical treatment and regimen: Secondary | ICD-10-CM | POA: Diagnosis not present

## 2020-11-02 DIAGNOSIS — F29 Unspecified psychosis not due to a substance or known physiological condition: Secondary | ICD-10-CM | POA: Diagnosis present

## 2020-11-02 DIAGNOSIS — G4700 Insomnia, unspecified: Secondary | ICD-10-CM | POA: Diagnosis present

## 2020-11-02 DIAGNOSIS — Z20822 Contact with and (suspected) exposure to covid-19: Secondary | ICD-10-CM | POA: Diagnosis present

## 2020-11-02 DIAGNOSIS — F312 Bipolar disorder, current episode manic severe with psychotic features: Secondary | ICD-10-CM | POA: Diagnosis not present

## 2020-11-02 DIAGNOSIS — Z9114 Patient's other noncompliance with medication regimen: Secondary | ICD-10-CM | POA: Diagnosis not present

## 2020-11-02 DIAGNOSIS — F319 Bipolar disorder, unspecified: Secondary | ICD-10-CM | POA: Diagnosis not present

## 2020-11-02 DIAGNOSIS — F22 Delusional disorders: Secondary | ICD-10-CM | POA: Diagnosis present

## 2020-11-02 DIAGNOSIS — F3110 Bipolar disorder, current episode manic without psychotic features, unspecified: Secondary | ICD-10-CM | POA: Diagnosis present

## 2020-11-02 DIAGNOSIS — Z87891 Personal history of nicotine dependence: Secondary | ICD-10-CM | POA: Diagnosis not present

## 2020-11-02 LAB — LIPID PANEL
Cholesterol: 175 mg/dL (ref 0–200)
HDL: 43 mg/dL (ref >40)
LDL Cholesterol: 122 mg/dL — ABNORMAL HIGH (ref 0–99)
Total CHOL/HDL Ratio: 4.1 RATIO
Triglycerides: 52 mg/dL (ref <150)
VLDL: 10 mg/dL (ref 0–40)

## 2020-11-02 LAB — HEMOGLOBIN A1C
Hgb A1c MFr Bld: 5.4 % (ref 4.8–5.6)
Mean Plasma Glucose: 108.28 mg/dL

## 2020-11-02 MED ORDER — ZIPRASIDONE MESYLATE 20 MG IM SOLR: 20.0000 mg | Freq: Three times a day (TID) | INTRAMUSCULAR | Status: DC | PRN

## 2020-11-02 MED ORDER — ALUM & MAG HYDROXIDE-SIMETH 200-200-20 MG/5ML PO SUSP: 30.0000 mL | ORAL | Status: DC | PRN

## 2020-11-02 MED ORDER — HALOPERIDOL 5 MG PO TABS
5.0000 mg | ORAL_TABLET | Freq: Two times a day (BID) | ORAL | Status: DC
  Administered 2020-11-02: 5 mg via ORAL
  Filled 2020-11-02: qty 1

## 2020-11-02 MED ORDER — ACETAMINOPHEN 325 MG PO TABS: 650.0000 mg | ORAL_TABLET | Freq: Four times a day (QID) | ORAL | Status: DC | PRN

## 2020-11-02 MED ORDER — BENZTROPINE MESYLATE 1 MG PO TABS: 2.0000 mg | ORAL_TABLET | Freq: Two times a day (BID) | ORAL | Status: DC | PRN

## 2020-11-02 MED ORDER — OLANZAPINE 10 MG PO TABS: 10.0000 mg | ORAL_TABLET | Freq: Two times a day (BID) | ORAL | Status: DC | PRN

## 2020-11-02 MED ORDER — HALOPERIDOL 5 MG PO TABS
5.0000 mg | ORAL_TABLET | Freq: Two times a day (BID) | ORAL | Status: DC
  Administered 2020-11-02 – 2020-11-07 (×10): 5 mg via ORAL
  Filled 2020-11-02 (×10): qty 1

## 2020-11-02 MED ORDER — TRAZODONE HCL 50 MG PO TABS
50.0000 mg | ORAL_TABLET | Freq: Every evening | ORAL | Status: DC | PRN
  Administered 2020-11-02 – 2020-11-06 (×5): 50 mg via ORAL
  Filled 2020-11-02 (×5): qty 1

## 2020-11-02 MED ORDER — MAGNESIUM HYDROXIDE 400 MG/5ML PO SUSP: 30.0000 mL | Freq: Every day | ORAL | Status: DC | PRN

## 2020-11-02 MED ORDER — HYDROXYZINE HCL 50 MG PO TABS
50.0000 mg | ORAL_TABLET | Freq: Three times a day (TID) | ORAL | Status: DC | PRN
  Administered 2020-11-03: 50 mg via ORAL
  Filled 2020-11-02: qty 1

## 2020-11-02 MED ORDER — TRAZODONE HCL 50 MG PO TABS: 50.0000 mg | ORAL_TABLET | Freq: Every evening | ORAL | Status: DC | PRN

## 2020-11-02 NOTE — ED Provider Notes (Signed)
Emergency Medicine Observation Re-evaluation Note  Tracy Poole is a 24 y.o. female, seen on rounds today.  Pt initially presented to the ED for complaints of IVC Currently, the patient is resting.   Physical Exam  BP 134/74   Pulse (!) 113   Temp 98.8 F (37.1 C) (Oral)   Resp 16   Ht 1.626 m (5\' 4" )   Wt 72.1 kg   SpO2 100%   BMI 27.29 kg/m  Physical Exam Gen:  No acute distress Resp:  Breathing easily and comfortably, no accessory muscle usage Neuro:  Moving all four extremities, no gross focal neuro deficits Psych:  Resting currently, intermittently cooperative when awake.  ED Course / MDM  EKG:    I have reviewed the labs performed to date as well as medications administered while in observation.  Recent changes in the last 24 hours include assessment by ED physician and telepsychiatry as well as local TTS.  Plan  Current plan is for reassessment in the morning after collateral can be obtained.  There was some concern about child safety at home so we will maintain IVC for now.  I reviewed the written report from the telepsychiatrist and put in the order for Zyprexa as recommended by him and the TTS service knows that she will need additional follow-up this morning. Patient is under full IVC at this time.   , MD 11/02/20 [phone]

## 2020-11-02 NOTE — Consult Note (Signed)
[facility] Face-to-Face Psychiatry Consult   Reason for Consult: Consult for 24 year old woman with a history of psychotic disorder brought in under IVC Referring Physician: Scotty Court Patient Identification: Tracy Poole MRN:  [id number] Principal Diagnosis: Bipolar 1 disorder (HCC) Diagnosis:  Principal Problem:   Bipolar 1 disorder (HCC) Active Problems:   Noncompliance with medications   Total Time spent with patient: 1 hour  Subjective:   Tracy Poole is a 24 y.o. female patient admitted with "I need something to help me rest".  HPI: Patient seen chart reviewed including commitment paperwork.  IVC paperwork filed by [employer] representative because of patient's psychosis.  Mother called for help stating that the patient has been threatening her and assaulting her.  Also alleging psychotic symptoms with the example that the patient has been claiming to be the girlfriend of a famous R&B singer and that she travels back and forth to [place].  Patient denied all of those specific things but was not forthcoming with much information.  Tells me that she needs medicine to help her sleep better.  She says all she does is stay in her room all the time.  Denies having been aggressive to her mother.  Denies suicidal ideation denies any hallucinations.  Allegations also present that the patient was outside in cold weather inappropriately dressed with her 70-year-old child.  Past Psychiatric History: History of psychotic disorder both diagnosed variously as bipolar disorder and schizophrenia.  Probably schizoaffective ominously.  Last known admission was last January.  Tends to have a similar presentation of getting aggressive and bizarre at home.  History of recurrent noncompliance with treatment.  No known suicide attempts.  Concern has been expressed in the past not because of child abuse but just because of her mental state making it difficult for her to safely care for her 1 child.  Last year we were  discharging her on a combination of Abilify and Haldol  Risk to Self: Suicidal Ideation: No Suicidal Intent: No Is patient at risk for suicide?: No Suicidal Plan?: No Access to Means:  (Unknown) What has been your use of drugs/alcohol within the last 12 months?: None reported How many times?: 0 (None reported) Other Self Harm Risks: 0 (None reported) Triggers for Past Attempts: None known Intentional Self Injurious Behavior: None Risk to Others: Homicidal Ideation: No Thoughts of Harm to Others: No Current Homicidal Intent: No Current Homicidal Plan: No Access to Homicidal Means:  (Unknown) Identified Victim:  (None reported) History of harm to others?: No Assessment of Violence: None Noted Violent Behavior Description: None shown Does patient have access to weapons?: No Criminal Charges Pending?: No Does patient have a court date: No Prior Inpatient Therapy: Prior Inpatient Therapy: Yes [facility]) Prior Therapy Dates: Current Prior Therapy Facilty/Provider(s): [facility] Reason for Treatment: Schizophrenia Prior Outpatient Therapy: Prior Outpatient Therapy: Yes ([facility]) Prior Therapy Dates: Current Prior Therapy Facilty/Provider(s): [facility] Reason for Treatment: Schizophrenia Does patient have an ACCT team?: No Does patient have Intensive In-House Services?  : No Does patient have [facility] services? : No Does patient have P4CC services?: No  Past Medical History:  Past Medical History:  Diagnosis Date  . Anxiety   . Asthma   . Depression   . Schizophrenia [facility])     Past Surgical History:  Procedure Laterality Date  . CESAREAN SECTION    . EUSTACHIAN TUBE DILATION    . HERNIA REPAIR     Family History:  Family History  Problem Relation Age of Onset  . Bladder  Cancer Neg Hx   . Kidney cancer Neg Hx    Family Psychiatric  History: None reported Social History:  Social History   Substance and Sexual Activity  Alcohol Use Yes   Comment: occasionallly     Social History    Substance and Sexual Activity  Drug Use No    Social History   Socioeconomic History  . Marital status: Single    Spouse name: Not on file  . Number of children: Not on file  . Years of education: Not on file  . Highest education level: Not on file  Occupational History  . Not on file  Tobacco Use  . Smoking status: Former Smoker    Packs/day: 0.25    Types: Cigarettes  . Smokeless tobacco: Never Used  Vaping Use  . Vaping Use: Never used  Substance and Sexual Activity  . Alcohol use: Yes    Comment: occasionallly  . Drug use: No  . Sexual activity: Yes    Birth control/protection: None  Other Topics Concern  . Not on file  Social History Narrative  . Not on file   Social Determinants of Health   Financial Resource Strain:   . Difficulty of Paying Living Expenses: Not on file  Food Insecurity:   . Worried About Programme researcher, broadcasting/film/videounning Out of Food in the Last Year: Not on file  . Ran Out of Food in the Last Year: Not on file  Transportation Needs:   . Lack of Transportation (Medical): Not on file  . Lack of Transportation (Non-Medical): Not on file  Physical Activity:   . Days of Exercise per Week: Not on file  . Minutes of Exercise per Session: Not on file  Stress:   . Feeling of Stress : Not on file  Social Connections:   . Frequency of Communication with Friends and Family: Not on file  . Frequency of Social Gatherings with Friends and Family: Not on file  . Attends Religious Services: Not on file  . Active Member of Clubs or Organizations: Not on file  . Attends BankerClub or Organization Meetings: Not on file  . Marital Status: Not on file   Additional Social History:    Allergies:  No Known Allergies  Labs:  Results for orders placed or performed during the hospital encounter of 11/01/20 (from the past 48 hour(s))  Comprehensive metabolic panel     Status: None   Collection Time: 11/01/20  6:17 PM  Result Value Ref Range   Sodium 137 135 - 145 mmol/L   Potassium  3.8 3.5 - 5.1 mmol/L   Chloride 106 98 - 111 mmol/L   CO2 23 22 - 32 mmol/L   Glucose, Bld 96 70 - 99 mg/dL    Comment: Glucose reference range applies only to samples taken after fasting for at least 8 hours.   BUN 10 6 - 20 mg/dL   Creatinine, Ser 1.610.85 0.44 - 1.00 mg/dL   Calcium 9.2 8.9 - 09.610.3 mg/dL   Total Protein 7.8 6.5 - 8.1 g/dL   Albumin 4.1 3.5 - 5.0 g/dL   AST 24 15 - 41 U/L   ALT 39 0 - 44 U/L   Alkaline Phosphatase 77 38 - 126 U/L   Total Bilirubin 0.8 0.3 - 1.2 mg/dL   GFR, Estimated >04>60 >54>60 mL/min    Comment: (NOTE) Calculated using the CKD-EPI Creatinine Equation (2021)    Anion gap 8 5 - 15    Comment: Performed at [facility], [address], [place], [place] [address]  Ethanol     Status: None   Collection Time: 11/01/20  6:17 PM  Result Value Ref Range   Alcohol, Ethyl (B) <10 <10 mg/dL    Comment: (NOTE) Lowest detectable limit for serum alcohol is 10 mg/dL.  For medical purposes only. Performed at [facility], [address] [place], [place] [address]   Salicylate level     Status: Abnormal   Collection Time: 11/01/20  6:17 PM  Result Value Ref Range   Salicylate Lvl <7.0 (L) 7.0 - 30.0 mg/dL    Comment: Performed at [facility], [address] [place], [place] [address]  Acetaminophen level     Status: Abnormal   Collection Time: 11/01/20  6:17 PM  Result Value Ref Range   Acetaminophen (Tylenol), Serum <10 (L) 10 - 30 ug/mL    Comment: (NOTE) Therapeutic concentrations vary significantly. A range of 10-30 ug/mL  may be an effective concentration for many patients. However, some  are best treated at concentrations outside of this range. Acetaminophen concentrations >150 ug/mL at 4 hours after ingestion  and >50 ug/mL at 12 hours after ingestion are often associated with  toxic reactions.  Performed at [facility], [address] [place], [place] [address]   cbc     Status: Abnormal   Collection  Time: 11/01/20  6:17 PM  Result Value Ref Range   WBC 11.0 (H) 4.0 - 10.5 K/uL   RBC 4.68 3.87 - 5.11 MIL/uL   Hemoglobin 13.2 12.0 - 15.0 g/dL   HCT 26.3 36 - 46 %   MCV 85.5 80.0 - 100.0 fL   MCH 28.2 26.0 - 34.0 pg   MCHC 33.0 30.0 - 36.0 g/dL   RDW 78.5 88.5 - 02.7 %   Platelets 216 150 - 400 K/uL   nRBC 0.0 0.0 - 0.2 %    Comment: Performed at [facility], [address], [place], [place] [address]  Urine Drug Screen, Qualitative     Status: None   Collection Time: 11/01/20  6:19 PM  Result Value Ref Range   Tricyclic, Ur Screen NONE DETECTED NONE DETECTED   Amphetamines, Ur Screen NONE DETECTED NONE DETECTED   MDMA (Ecstasy)Ur Screen NONE DETECTED NONE DETECTED   Cocaine Metabolite,Ur [place] NONE DETECTED NONE DETECTED   Opiate, Ur Screen NONE DETECTED NONE DETECTED   Phencyclidine (PCP) Ur S NONE DETECTED NONE DETECTED   Cannabinoid 50 Ng, Ur [place] NONE DETECTED NONE DETECTED   Barbiturates, Ur Screen NONE DETECTED NONE DETECTED   Benzodiazepine, Ur Scrn NONE DETECTED NONE DETECTED   Methadone Scn, Ur NONE DETECTED NONE DETECTED    Comment: (NOTE) Tricyclics + metabolites, urine    Cutoff 1000 ng/mL Amphetamines + metabolites, urine  Cutoff 1000 ng/mL MDMA (Ecstasy), urine              Cutoff 500 ng/mL Cocaine Metabolite, urine          Cutoff 300 ng/mL Opiate + metabolites, urine        Cutoff 300 ng/mL Phencyclidine (PCP), urine         Cutoff 25 ng/mL Cannabinoid, urine                 Cutoff 50 ng/mL Barbiturates + metabolites, urine  Cutoff 200 ng/mL Benzodiazepine, urine              Cutoff 200 ng/mL Methadone, urine  Cutoff 300 ng/mL  The urine drug screen provides only a preliminary, unconfirmed analytical test result and should not be used for non-medical purposes. Clinical consideration and professional judgment should be applied to any positive drug screen result due to possible interfering substances. A more specific alternate chemical  method must be used in order to obtain a confirmed analytical result. Gas chromatography / mass spectrometry (GC/MS) is the preferred confirm atory method. Performed at [facility], [address] [place], [place] [address]   Pregnancy, urine     Status: None   Collection Time: 11/01/20  6:19 PM  Result Value Ref Range   Preg Test, Ur NEGATIVE NEGATIVE    Comment: Performed at [facility], [address] [place], [place] [address]  Resp Panel by RT-PCR (Flu A&B, Covid) Nasopharyngeal Swab     Status: None   Collection Time: 11/01/20  7:34 PM   Specimen: Nasopharyngeal Swab; Nasopharyngeal(NP) swabs in vial transport medium  Result Value Ref Range   SARS Coronavirus 2 by RT PCR NEGATIVE NEGATIVE    Comment: (NOTE) SARS-CoV-2 target nucleic acids are NOT DETECTED.  The SARS-CoV-2 RNA is generally detectable in upper respiratory specimens during the acute phase of infection. The lowest concentration of SARS-CoV-2 viral copies this assay can detect is 138 copies/mL. A negative result does not preclude SARS-Cov-2 infection and should not be used as the sole basis for treatment or other patient management decisions. A negative result may occur with  improper specimen collection/handling, submission of specimen other than nasopharyngeal swab, presence of viral mutation(s) within the areas targeted by this assay, and inadequate number of viral copies(<138 copies/mL). A negative result must be combined with clinical observations, patient history, and epidemiological information. The expected result is Negative.  Fact Sheet for Patients:  BloggerCourse.com  Fact Sheet for Healthcare Providers:  SeriousBroker.it  This test is no t yet approved or cleared by the [place] FDA and  has been authorized for detection and/or diagnosis of SARS-CoV-2 by FDA under an Emergency Use Authorization (EUA). This EUA will  remain  in effect (meaning this test can be used) for the duration of the COVID-19 declaration under Section 564(b)(1) of the Act, 21 U.S.C.section 360bbb-3(b)(1), unless the authorization is terminated  or revoked sooner.       Influenza A by PCR NEGATIVE NEGATIVE   Influenza B by PCR NEGATIVE NEGATIVE    Comment: (NOTE) The Xpert Xpress SARS-CoV-2/FLU/RSV plus assay is intended as an aid in the diagnosis of influenza from Nasopharyngeal swab specimens and should not be used as a sole basis for treatment. Nasal washings and aspirates are unacceptable for Xpert Xpress SARS-CoV-2/FLU/RSV testing.  Fact Sheet for Patients: BloggerCourse.com  Fact Sheet for Healthcare Providers: SeriousBroker.it  This test is not yet approved or cleared by the [place] FDA and has been authorized for detection and/or diagnosis of SARS-CoV-2 by FDA under an Emergency Use Authorization (EUA). This EUA will remain in effect (meaning this test can be used) for the duration of the COVID-19 declaration under Section 564(b)(1) of the Act, 21 U.S.C. section 360bbb-3(b)(1), unless the authorization is terminated or revoked.  Performed at [facility], [address] [place], [place] [address]   POC urine preg, ED     Status: None   Collection Time: 11/01/20  7:35 PM  Result Value Ref Range   Preg Test, Ur NEGATIVE NEGATIVE    Comment:        THE SENSITIVITY OF THIS METHODOLOGY IS >  24 mIU/mL     Current Facility-Administered Medications  Medication Dose Route Frequency Provider Last Rate Last Admin  . OLANZapine (ZYPREXA) tablet 10 mg  10 mg Oral Q12H PRN Loleta Rose, MD       Current Outpatient Medications  Medication Sig Dispense Refill  . ARIPiprazole ER (ABILIFY MAINTENA) 400 MG SRER injection Inject 2 mLs (400 mg total) into the muscle every 28 (twenty-eight) days. 1 each 1  . benztropine (COGENTIN) 2 MG tablet Take 1 tablet  (2 mg total) by mouth 2 (two) times daily as needed for tremors (spitting). 60 tablet 1  . haloperidol (HALDOL) 5 MG tablet Take 1 tablet (5 mg total) by mouth 2 (two) times daily. 60 tablet 1  . traZODone (DESYREL) 50 MG tablet Take 1 tablet (50 mg total) by mouth at bedtime as needed for sleep. 30 tablet 1    Musculoskeletal: Strength & Muscle Tone: within normal limits Gait & Station: normal Patient leans: N/A  Psychiatric Specialty Exam: Physical Exam Vitals and nursing note reviewed.  Constitutional:      Appearance: She is well-developed.  HENT:     Head: Normocephalic and atraumatic.  Eyes:     Conjunctiva/sclera: Conjunctivae normal.     Pupils: Pupils are equal, round, and reactive to light.  Cardiovascular:     Heart sounds: Normal heart sounds.  Pulmonary:     Effort: Pulmonary effort is normal.  Abdominal:     Palpations: Abdomen is soft.  Musculoskeletal:        General: Normal range of motion.     Cervical back: Normal range of motion.  Skin:    General: Skin is warm and dry.  Neurological:     General: No focal deficit present.     Mental Status: She is alert.  Psychiatric:        Attention and Perception: She is inattentive.        Mood and Affect: Affect is blunt.        Speech: She is noncommunicative.        Behavior: Behavior is withdrawn.        Thought Content: Thought content is paranoid. Thought content does not include homicidal or suicidal ideation.        Cognition and Memory: Memory is impaired.        Judgment: Judgment is inappropriate.     Review of Systems  Constitutional: Negative.   HENT: Negative.   Eyes: Negative.   Respiratory: Negative.   Cardiovascular: Negative.   Gastrointestinal: Negative.   Musculoskeletal: Negative.   Skin: Negative.   Neurological: Negative.   Psychiatric/Behavioral: Positive for behavioral problems, confusion and sleep disturbance. The patient is nervous/anxious.     Blood pressure 129/79, pulse (!)  110, temperature 98.8 F (37.1 C), temperature source Oral, resp. rate 17, height 5\' 4"  (1.626 m), weight 72.1 kg, SpO2 100 %.Body mass index is 27.29 kg/m.  General Appearance: Disheveled  Eye Contact:  Minimal  Speech:  Slow  Volume:  Decreased  Mood:  Euthymic  Affect:  Constricted and Inappropriate  Thought Process:  Disorganized  Orientation:  Full (Time, Place, and Person)  Thought Content:  Illogical and Paranoid Ideation  Suicidal Thoughts:  No  Homicidal Thoughts:  No  Memory:  Immediate;   Fair Recent;   Poor Remote;   Poor  Judgement:  Impaired  Insight:  Shallow  Psychomotor Activity:  Decreased  Concentration:  Concentration: Poor  Recall:  Poor  Fund of Knowledge:  Fair  Language:  Fair  Akathisia:  No  Handed:  Right  AIMS (if indicated):     Assets:  Desire for Improvement Housing Physical Health Resilience  ADL's:  Impaired  Cognition:  Impaired,  Mild  Sleep:        Treatment Plan Summary: Daily contact with patient to assess and evaluate symptoms and progress in treatment, Medication management and Plan 23 year old woman with chronic psychotic disorder presents to the hospital with believable evidence of psychosis.  Based on the psychosis, the concerns about her taking care of her child, her past history of serious behavior problems and recurrent noncompliance I think the safest situation is to uphold the IVC and admit the patient to the psychiatry ward.  Orders reviewed.  We will try restarting some medication based on last year's treatment.  Make sure we get metabolic labs and EKG.  Continue IVC.  Case reviewed with ER physician and TTS.  Disposition: Recommend psychiatric Inpatient admission when medically cleared.  Mordecai Rasmussen, MD 11/02/2020 11:15 AM

## 2020-11-02 NOTE — Progress Notes (Signed)
Tracy Poole is an 24 y.o. female that came in through the ED involuntarily under IVC by [facility] with [place] [employer]. Pt states she is here to get some kind of shot. Pt was threatening her mother, thinks she is [name]'s girlfriend and she goes to [place] every night. Pt also had her child out in the cold without proper clothing. Paperwork states pt is danger to self and child. PT is calm and cooperative during assessment and denies SI/HI/AVH.  Skin assessment preformed upon admission. Skin is intact without injury.

## 2020-11-02 NOTE — BH Assessment (Addendum)
Patient can come down at 4pm (If pt is unable to arrive before 4:30pm pt will have to wait to arrive after 9pm)  Call to give report: [phone]  Patient is to be admitted to [facility] by Dr. Neale Burly.  Attending Physician will be. Dr. Neale Burly.   Patient has been assigned to room 305, by [facility] Charge Nurse Maryelizabeth Kaufmann, RN.   Intake Paper Work has been signed and placed on patient chart.  ER staff is aware of the admission: 1. Drinda Butts, ER Secretary  2. Vicente Males, ER MD  3. Pattricia Boss Patient's Nurse  4. THO Patient Access

## 2020-11-02 NOTE — ED Notes (Signed)
SOC calls to verify that sound is working, hangs up.

## 2020-11-02 NOTE — BH Assessment (Signed)
Pt currently under review with [facility] BMU 

## 2020-11-02 NOTE — ED Notes (Signed)
Hourly rounding completed at this time, patient currently asleep in room. No complaints, stable, and in no acute distress. Q15 minute rounds and monitoring via Rover and Officer to continue. 

## 2020-11-02 NOTE — ED Notes (Signed)
SOC on screen assessing pt

## 2020-11-02 NOTE — BH Assessment (Signed)
**Note Tracy-Identified via Obfuscation** Assessment Note  Tracy Poole is an 24 y.o. female who presents to [facility] ED involuntarily for treatment. Per triage note, PT to ED under IVC with [place] [employer]. Per pt, she is here to get some kind of shot. Per IVC paperwork, pt was threatening her mother, thinks she is [name]'s girlfriend and she goes to [place] every night, she also had her child out in the cold without proper clothing. Paperwork states pt is danger to self and child. PT is calm at this time.   During TTS assessment pt presents alert and oriented x 4, pleasant and cooperative, and mood-congruent with affect. The pt does not appear to be responding to internal or external stimuli. Neither is the pt presenting with any delusional thinking. Pt denied the information provided to triage RN staing "that is stupid." Pt identifies her main complaint to be that she wants medication so she can feel like herself. Pt denies SA and alcohol. Pt reports INPT hx with [facility] and current OPT hx with [facility]. Pt denies SI/HI/AH/VH. Pt contracts for safety. Pt provided her mom as a collateral contact.   Disposition pending collateral information due to safety concerns for her child.     Diagnosis: Per previous history, Schizophrenia  Past Medical History:  Past Medical History:  Diagnosis Date  . Anxiety   . Asthma   . Depression   . Schizophrenia [facility])     Past Surgical History:  Procedure Laterality Date  . CESAREAN SECTION    . EUSTACHIAN TUBE DILATION    . HERNIA REPAIR      Family History:  Family History  Problem Relation Age of Onset  . Bladder Cancer Neg Hx   . Kidney cancer Neg Hx     Social History:  reports that she has quit smoking. Her smoking use included cigarettes. She smoked 0.25 packs per day. She has never used smokeless tobacco. She reports current alcohol use. She reports that she does not use drugs.  Additional Social History:  Alcohol / Drug Use Pain Medications: See MAR Prescriptions: See MAR Over the  Counter: See MAR History of alcohol / drug use?: No history of alcohol / drug abuse  CIWA: CIWA-Ar BP: 134/74 Pulse Rate: (!) 113 COWS:    Allergies: No Known Allergies  Home Medications: (Not in a hospital admission)   OB/GYN Status:  No LMP recorded. (Menstrual status: Irregular Periods).  General Assessment Data Location of Assessment: [facility] ED TTS Assessment: In system Is this a Tele or Face-to-Face Assessment?: Face-to-Face Is this an Initial Assessment or a Re-assessment for this encounter?: Initial Assessment Patient Accompanied by:: N/A (Self) Language Other than English: No Living Arrangements: Other (Comment) (Lives with mom and child) What gender do you identify as?: Female Date Telepsych consult ordered in [facility]: 11/01/20 Time Telepsych consult ordered in [facility]: 1920 Marital status: Single Maiden name: Deshpande Pregnancy Status: No Living Arrangements: Parent (Lives with mother and her 65yr old daughter) Can pt return to current living arrangement?: Yes Admission Status: Involuntary Petitioner: Other ([facility]) Is patient capable of signing voluntary admission?: Yes Referral Source: Other ([facility]) Insurance type: Medicaid  Medical Screening Exam [facility] Walk-in ONLY) Medical Exam completed: Yes  Crisis Care Plan Living Arrangements: Parent (Lives with mother and her 45yr old daughter) [employer] operational [employer] Guardian: Other: (Self) Name of Psychiatrist: [facility] Name of Therapist: [facility]  Education Status Is patient currently in school?: No Is the patient employed, unemployed or receiving disability?:  (Unknown)  Risk to self with the past 6  months Suicidal Ideation: No Suicidal Intent: No Has patient had any suicidal intent within the past 6 months prior to admission? : No Is patient at risk for suicide?: No Suicidal Plan?: No Has patient had any suicidal plan within the past 6 months prior to admission? : No Access to Means:  (Unknown) What has been your use of drugs/alcohol within the last  12 months?: None reported Previous Attempts/Gestures: No How many times?: 0 (None reported) Other Self Harm Risks: 0 (None reported) Triggers for Past Attempts: None known Intentional Self Injurious Behavior: None Family Suicide History: Unknown Recent stressful life event(s):  (None reported) Persecutory voices/beliefs?: No Depression: No Depression Symptoms:  (None reported) Substance abuse history and/or treatment for substance abuse?: No (None reported)  Risk to Others within the past 6 months Homicidal Ideation: No Does patient have any lifetime risk of violence toward others beyond the six months prior to admission? : No Thoughts of Harm to Others: No Current Homicidal Intent: No Current Homicidal Plan: No Access to Homicidal Means:  (Unknown) Identified Victim:  (None reported) History of harm to others?: No Assessment of Violence: None Noted Violent Behavior Description: None shown Does patient have access to weapons?: No Criminal Charges Pending?: No Does patient have a court date: No Is patient on probation?: No  Psychosis Hallucinations: None noted Delusions: None noted  Mental Status Report Appearance/Hygiene: In scrubs Eye Contact: Good Motor Activity: Freedom of movement Speech: Logical/coherent Level of Consciousness: Alert, Quiet/awake Mood: Pleasant Affect: Appropriate to circumstance Anxiety Level: None Thought Processes: Coherent Judgement: Unimpaired Orientation: Person, Place, Time, Situation, Appropriate for developmental age Obsessive Compulsive Thoughts/Behaviors: None  Cognitive Functioning Concentration: Normal Memory: Recent Intact, Remote Intact Is patient IDD: No Insight: Good Impulse Control: Good Appetite: Good Have you had any weight changes? : No Change Sleep: No Change Total Hours of Sleep: 8 Vegetative Symptoms: None  ADLScreening [facility] Assessment Services) Patient's cognitive ability adequate to safely complete daily  activities?: Yes Patient able to express need for assistance with ADLs?: Yes Independently performs ADLs?: Yes (appropriate for developmental age)  Prior Inpatient Therapy Prior Inpatient Therapy: Yes [facility]) Prior Therapy Dates: Current Prior Therapy Facilty/Provider(s): [facility] Reason for Treatment: Schizophrenia  Prior Outpatient Therapy Prior Outpatient Therapy: Yes ([facility]) Prior Therapy Dates: Current Prior Therapy Facilty/Provider(s): [facility] Reason for Treatment: Schizophrenia Does patient have an ACCT team?: No Does patient have Intensive In-House Services?  : No Does patient have [facility] services? : No Does patient have P4CC services?: No  ADL Screening (condition at time of admission) Patient's cognitive ability adequate to safely complete daily activities?: Yes Is the patient deaf or have difficulty hearing?: No Does the patient have difficulty seeing, even when wearing glasses/contacts?: No Does the patient have difficulty concentrating, remembering, or making decisions?: No Patient able to express need for assistance with ADLs?: Yes Does the patient have difficulty dressing or bathing?: No Independently performs ADLs?: Yes (appropriate for developmental age) Does the patient have difficulty walking or climbing stairs?: No Weakness of Legs: None Weakness of Arms/Hands: None  Home Assistive Devices/Equipment Home Assistive Devices/Equipment: None  Therapy Consults (therapy consults require a physician order) PT Evaluation Needed: No OT Evalulation Needed: No SLP Evaluation Needed: No Abuse/Neglect Assessment (Assessment to be complete while patient is alone) Abuse/Neglect Assessment Can Be Completed: Yes Physical Abuse: Denies Verbal Abuse: Denies Sexual Abuse: Denies Exploitation of patient/patient's resources: Denies Self-Neglect: Denies Values / Beliefs Cultural Requests During Hospitalization: None Spiritual Requests During Hospitalization:  None Consults Spiritual Care Consult Needed:  No Transition of Care Team Consult Needed: No Advance Directives (For Healthcare) Does Patient Have a Medical Advance Directive?: No Would patient like information on creating a medical advance directive?: No - Patient declined          Disposition:  Disposition Initial Assessment Completed for this Encounter: Yes  On Site Evaluation by:   Reviewed with Physician:    Clerance Lav 11/02/2020 4:20 AM

## 2020-11-02 NOTE — Tx Team (Signed)
Initial Treatment Plan 11/02/2020 4:43 PM Tracy Poole QIH:[id number]    PATIENT STRESSORS: Health problems Medication change or noncompliance   PATIENT STRENGTHS: Motivation for treatment/growth Supportive family/friends   PATIENT IDENTIFIED PROBLEMS: Ineffective coping skills  Non medication complaint                   DISCHARGE CRITERIA:  Improved stabilization in mood, thinking, and/or behavior Motivation to continue treatment in a less acute level of care  PRELIMINARY DISCHARGE PLAN: Outpatient therapy Return to previous living arrangement  PATIENT/FAMILY INVOLVEMENT: This treatment plan has been presented to and reviewed with the patient, Tracy Poole, and/or family member.  The patient and family have been given the opportunity to ask questions and make suggestions.  Sharin Mons, RN 11/02/2020, 4:43 PM

## 2020-11-02 NOTE — ED Notes (Signed)
Pt asleep at this time, unable to collect vitals. Will collect pt vitals once awake. 

## 2020-11-02 NOTE — ED Notes (Signed)
Report provided to Sue Lush, [place]

## 2020-11-02 NOTE — ED Notes (Signed)
SOC calls to have video chat computer set up

## 2020-11-03 DIAGNOSIS — F312 Bipolar disorder, current episode manic severe with psychotic features: Secondary | ICD-10-CM

## 2020-11-03 MED ORDER — ARIPIPRAZOLE 10 MG PO TABS
10.0000 mg | ORAL_TABLET | Freq: Every day | ORAL | Status: DC
  Administered 2020-11-03 – 2020-11-04 (×2): 10 mg via ORAL
  Filled 2020-11-03 (×2): qty 1

## 2020-11-03 NOTE — H&P (Signed)
Psychiatric Admission Assessment Adult  Patient Identification: Tracy Poole MRN:  [id number] Date of Evaluation:  11/03/2020 Chief Complaint:  Bipolar affective disorder, current episode manic (HCC) [F31.10] Principal Diagnosis: Bipolar affective disorder, current episode manic (HCC) Diagnosis:  Principal Problem:   Bipolar affective disorder, current episode manic (HCC) Active Problems:   Noncompliance with medications   Delusion (HCC)  History of Present Illness: Tracy Poole is a 24 year old female with history of bipolar affective disorder who present to hospital under IVC from [facility] representative due to psychosis. No acute overnight events, patient medication compliant. Today she states that she is here to restart her medication that help "keep her together" so she can rest. She continues to claim she is the girlfriend of Tracy Poole. She denies being aggressive to her mother stating they get along well. Patient denies suicidal ideations, homicidal ideations, visual hallucinations, and auditory hallucinations. However, patient is very guarded and appears to be responding to internal stimuli. She refuses to be in her room with the lights off, and is seen sleeping in the dayroom instead of her room when the television is on. It appears she is trying to drown out her internal auditory hallucinations with the television.   Associated Signs/Symptoms: Depression Symptoms:  insomnia, loss of energy/fatigue, Duration of Depression Symptoms: No data recorded (Hypo) Manic Symptoms:  Delusions, Grandiosity, Hallucinations, Impulsivity, Labiality of Mood, Anxiety Symptoms:  Excessive Worry, Psychotic Symptoms:  Delusions, Paranoia, Duration of Psychotic Symptoms: No data recorded PTSD Symptoms: Negative Total Time spent with patient: 45 minutes  Past Psychiatric History: History of psychotic disorder both diagnosed variously as bipolar disorder and schizophrenia. Likely, schizoaffective  disorder, bipolar type.  Last known admission was last January.  Tends to have a similar presentation of getting aggressive and bizarre at home.  History of recurrent noncompliance with treatment.  No known suicide attempts.  Concern has been expressed in the past of her mental state making it difficult for her to safely care for her  child.  Last year she was discharged on a combination of Abilify and Haldol  Is the patient at risk to self? Yes.    Has the patient been a risk to self in the past 6 months? Yes.    Has the patient been a risk to self within the distant past? No.  Is the patient a risk to others? Yes.    Has the patient been a risk to others in the past 6 months? No.  Has the patient been a risk to others within the distant past? No.   Prior Inpatient Therapy:   Prior Outpatient Therapy:    Alcohol Screening: 1. How often do you have a drink containing alcohol?: Monthly or less 2. How many drinks containing alcohol do you have on a typical day when you are drinking?: 1 or 2 3. How often do you have six or more drinks on one occasion?: Never AUDIT-C Score: 1 4. How often during the last year have you found that you were not able to stop drinking once you had started?: Never 5. How often during the last year have you failed to do what was normally expected from you because of drinking?: Never 6. How often during the last year have you needed a first drink in the morning to get yourself going after a heavy drinking session?: Never 7. How often during the last year have you had a feeling of guilt of remorse after drinking?: Never 8. How often during the last  year have you been unable to remember what happened the night before because you had been drinking?: Never 9. Have you or someone else been injured as a result of your drinking?: No 10. Has a relative or friend or a doctor or another health worker been concerned about your drinking or suggested you cut down?: No Alcohol Use  Disorder Identification Test Final Score (AUDIT): 1 Alcohol Brief Interventions/Follow-up: AUDIT Score <7 follow-up not indicated Substance Abuse History in the last 12 months:  No. Consequences of Substance Abuse: Negative Previous Psychotropic Medications: Yes  Psychological Evaluations: Yes  Past Medical History:  Past Medical History:  Diagnosis Date  . Anxiety   . Asthma   . Depression   . Schizophrenia [facility])     Past Surgical History:  Procedure Laterality Date  . CESAREAN SECTION    . EUSTACHIAN TUBE DILATION    . HERNIA REPAIR     Family History:  Family History  Problem Relation Age of Onset  . Bladder Cancer Neg Hx   . Kidney cancer Neg Hx    Family Psychiatric  History: None reported Tobacco Screening: Have you used any form of tobacco in the last 30 days? (Cigarettes, Smokeless Tobacco, Cigars, and/or Pipes): Yes Tobacco use, Select all that apply: 5 or more cigarettes per day Are you interested in Tobacco Cessation Medications?: Yes, will notify MD for an order Counseled patient on smoking cessation including recognizing danger situations, developing coping skills and basic information about quitting provided: Refused/Declined practical counseling Social History:  Social History   Substance and Sexual Activity  Alcohol Use Yes   Comment: occasionallly     Social History   Substance and Sexual Activity  Drug Use No    Additional Social History:                           Allergies:  No Known Allergies Lab Results:  Results for orders placed or performed during the hospital encounter of 11/01/20 (from the past 48 hour(s))  Comprehensive metabolic panel     Status: None   Collection Time: 11/01/20  6:17 PM  Result Value Ref Range   Sodium 137 135 - 145 mmol/L   Potassium 3.8 3.5 - 5.1 mmol/L   Chloride 106 98 - 111 mmol/L   CO2 23 22 - 32 mmol/L   Glucose, Bld 96 70 - 99 mg/dL    Comment: Glucose reference range applies only to samples  taken after fasting for at least 8 hours.   BUN 10 6 - 20 mg/dL   Creatinine, Ser 8.41 0.44 - 1.00 mg/dL   Calcium 9.2 8.9 - 66.0 mg/dL   Total Protein 7.8 6.5 - 8.1 g/dL   Albumin 4.1 3.5 - 5.0 g/dL   AST 24 15 - 41 U/L   ALT 39 0 - 44 U/L   Alkaline Phosphatase 77 38 - 126 U/L   Total Bilirubin 0.8 0.3 - 1.2 mg/dL   GFR, Estimated >63 >01 mL/min    Comment: (NOTE) Calculated using the CKD-EPI Creatinine Equation (2021)    Anion gap 8 5 - 15    Comment: Performed at [facility], [address], [place], [place] [address]  Ethanol     Status: None   Collection Time: 11/01/20  6:17 PM  Result Value Ref Range   Alcohol, Ethyl (B) <10 <10 mg/dL    Comment: (NOTE) Lowest detectable limit for serum alcohol is 10 mg/dL.  For  medical purposes only. Performed at [facility], [address] [place], [place] [address]   Salicylate level     Status: Abnormal   Collection Time: 11/01/20  6:17 PM  Result Value Ref Range   Salicylate Lvl <7.0 (L) 7.0 - 30.0 mg/dL    Comment: Performed at [facility], [address] [place], [place] [address]  Acetaminophen level     Status: Abnormal   Collection Time: 11/01/20  6:17 PM  Result Value Ref Range   Acetaminophen (Tylenol), Serum <10 (L) 10 - 30 ug/mL    Comment: (NOTE) Therapeutic concentrations vary significantly. A range of 10-30 ug/mL  may be an effective concentration for many patients. However, some  are best treated at concentrations outside of this range. Acetaminophen concentrations >150 ug/mL at 4 hours after ingestion  and >50 ug/mL at 12 hours after ingestion are often associated with  toxic reactions.  Performed at [facility], [address] [place], [place] [address]   cbc     Status: Abnormal   Collection Time: 11/01/20  6:17 PM  Result Value Ref Range   WBC 11.0 (H) 4.0 - 10.5 K/uL   RBC 4.68 3.87 - 5.11 MIL/uL   Hemoglobin 13.2 12.0 - 15.0 g/dL   HCT 10.2 72.5 - 36.6 %    MCV 85.5 80.0 - 100.0 fL   MCH 28.2 26.0 - 34.0 pg   MCHC 33.0 30.0 - 36.0 g/dL   RDW 44.0 34.7 - 42.5 %   Platelets 216 150 - 400 K/uL   nRBC 0.0 0.0 - 0.2 %    Comment: Performed at [facility], [address] [place], [place] [address]  Urine Drug Screen, Qualitative     Status: None   Collection Time: 11/01/20  6:19 PM  Result Value Ref Range   Tricyclic, Ur Screen NONE DETECTED NONE DETECTED   Amphetamines, Ur Screen NONE DETECTED NONE DETECTED   MDMA (Ecstasy)Ur Screen NONE DETECTED NONE DETECTED   Cocaine Metabolite,Ur [place] NONE DETECTED NONE DETECTED   Opiate, Ur Screen NONE DETECTED NONE DETECTED   Phencyclidine (PCP) Ur S NONE DETECTED NONE DETECTED   Cannabinoid 50 Ng, Ur [place] NONE DETECTED NONE DETECTED   Barbiturates, Ur Screen NONE DETECTED NONE DETECTED   Benzodiazepine, Ur Scrn NONE DETECTED NONE DETECTED   Methadone Scn, Ur NONE DETECTED NONE DETECTED    Comment: (NOTE) Tricyclics + metabolites, urine    Cutoff 1000 ng/mL Amphetamines + metabolites, urine  Cutoff 1000 ng/mL MDMA (Ecstasy), urine              Cutoff 500 ng/mL Cocaine Metabolite, urine          Cutoff 300 ng/mL Opiate + metabolites, urine        Cutoff 300 ng/mL Phencyclidine (PCP), urine         Cutoff 25 ng/mL Cannabinoid, urine                 Cutoff 50 ng/mL Barbiturates + metabolites, urine  Cutoff 200 ng/mL Benzodiazepine, urine              Cutoff 200 ng/mL Methadone, urine                   Cutoff 300 ng/mL  The urine drug screen provides only a preliminary, unconfirmed analytical test result and should not be used for non-medical purposes. Clinical consideration and professional judgment should be applied to any positive drug screen result due to possible  interfering substances. A more specific alternate chemical method must be used in order to obtain a confirmed analytical result. Gas chromatography / mass spectrometry (GC/MS) is the preferred confirm atory method. Performed  at [facility], [address] [place], [place] [address]   Pregnancy, urine     Status: None   Collection Time: 11/01/20  6:19 PM  Result Value Ref Range   Preg Test, Ur NEGATIVE NEGATIVE    Comment: Performed at [facility], [address] [place], [place] [address]  Lipid panel     Status: Abnormal   Collection Time: 11/01/20  6:19 PM  Result Value Ref Range   Cholesterol 175 0 - 200 mg/dL   Triglycerides 52 <694 mg/dL   HDL 43 >85 mg/dL   Total CHOL/HDL Ratio 4.1 RATIO   VLDL 10 0 - 40 mg/dL   LDL Cholesterol 462 (H) 0 - 99 mg/dL    Comment:        Total Cholesterol/HDL:CHD Risk Coronary Heart Disease Risk Table                     Men   Women  1/2 Average Risk   3.4   3.3  Average Risk       5.0   4.4  2 X Average Risk   9.6   7.1  3 X Average Risk  23.4   11.0        Use the calculated Patient Ratio above and the CHD Risk Table to determine the patient's CHD Risk.        ATP III CLASSIFICATION (LDL):  <100     mg/dL   Optimal  703-500  mg/dL   Near or Above                    Optimal  130-159  mg/dL   Borderline  938-182  mg/dL   High  >993     mg/dL   Very High Performed at [facility], [address] [place], [place] [address]   Hemoglobin A1c     Status: None   Collection Time: 11/01/20  6:19 PM  Result Value Ref Range   Hgb A1c MFr Bld 5.4 4.8 - 5.6 %    Comment: (NOTE) Pre diabetes:          5.7%-6.4%  Diabetes:              >6.4%  Glycemic control for   <7.0% adults with diabetes    Mean Plasma Glucose 108.28 mg/dL    Comment: Performed at [facility], [address], [place], [place] [address]  Resp Panel by RT-PCR (Flu A&B, Covid) Nasopharyngeal Swab     Status: None   Collection Time: 11/01/20  7:34 PM   Specimen: Nasopharyngeal Swab; Nasopharyngeal(NP) swabs in vial transport medium  Result Value Ref Range   SARS Coronavirus 2 by RT PCR NEGATIVE NEGATIVE    Comment: (NOTE) SARS-CoV-2 target nucleic  acids are NOT DETECTED.  The SARS-CoV-2 RNA is generally detectable in upper respiratory specimens during the acute phase of infection. The lowest concentration of SARS-CoV-2 viral copies this assay can detect is 138 copies/mL. A negative result does not preclude SARS-Cov-2 infection and should not be used as the sole basis for treatment or other patient management decisions. A negative result may occur with  improper specimen collection/handling, submission of specimen other than nasopharyngeal swab, presence of viral mutation(s) within the areas targeted  by this assay, and inadequate number of viral copies(<138 copies/mL). A negative result must be combined with clinical observations, patient history, and epidemiological information. The expected result is Negative.  Fact Sheet for Patients:  BloggerCourse.com  Fact Sheet for Healthcare Providers:  SeriousBroker.it  This test is no t yet approved or cleared by the [place] FDA and  has been authorized for detection and/or diagnosis of SARS-CoV-2 by FDA under an Emergency Use Authorization (EUA). This EUA will remain  in effect (meaning this test can be used) for the duration of the COVID-19 declaration under Section 564(b)(1) of the Act, 21 U.S.C.section 360bbb-3(b)(1), unless the authorization is terminated  or revoked sooner.       Influenza A by PCR NEGATIVE NEGATIVE   Influenza B by PCR NEGATIVE NEGATIVE    Comment: (NOTE) The Xpert Xpress SARS-CoV-2/FLU/RSV plus assay is intended as an aid in the diagnosis of influenza from Nasopharyngeal swab specimens and should not be used as a sole basis for treatment. Nasal washings and aspirates are unacceptable for Xpert Xpress SARS-CoV-2/FLU/RSV testing.  Fact Sheet for Patients: BloggerCourse.com  Fact Sheet for Healthcare Providers: SeriousBroker.it  This test is not  yet approved or cleared by the [place] FDA and has been authorized for detection and/or diagnosis of SARS-CoV-2 by FDA under an Emergency Use Authorization (EUA). This EUA will remain in effect (meaning this test can be used) for the duration of the COVID-19 declaration under Section 564(b)(1) of the Act, 21 U.S.C. section 360bbb-3(b)(1), unless the authorization is terminated or revoked.  Performed at [facility], [address] [place], [place] [address]   POC urine preg, ED     Status: None   Collection Time: 11/01/20  7:35 PM  Result Value Ref Range   Preg Test, Ur NEGATIVE NEGATIVE    Comment:        THE SENSITIVITY OF THIS METHODOLOGY IS >24 mIU/mL     Blood Alcohol level:  Lab Results  Component Value Date   ETH <10 11/01/2020   ETH <10 01/07/2020    Metabolic Disorder Labs:  Lab Results  Component Value Date   HGBA1C 5.4 11/01/2020   MPG 108.28 11/01/2020   MPG 99.67 11/28/2019   Lab Results  Component Value Date   PROLACTIN 53.4 (H) 03/28/2017   Lab Results  Component Value Date   CHOL 175 11/01/2020   TRIG 52 11/01/2020   HDL 43 11/01/2020   CHOLHDL 4.1 11/01/2020   VLDL 10 11/01/2020   LDLCALC 122 (H) 11/01/2020   LDLCALC 107 (H) 11/28/2019    Current Medications: Current Facility-Administered Medications  Medication Dose Route Frequency Provider Last Rate Last Admin  . acetaminophen (TYLENOL) tablet 650 mg  650 mg Oral Q6H PRN Clapacs, John T, MD      . alum & mag hydroxide-simeth (MAALOX/MYLANTA) 200-200-20 MG/5ML suspension 30 mL  30 mL Oral Q4H PRN Clapacs, John T, MD      . ARIPiprazole (ABILIFY) tablet 10 mg  10 mg Oral Daily Jesse Sans, MD   10 mg at 11/03/20 1232  . benztropine (COGENTIN) tablet 2 mg  2 mg Oral BID PRN Clapacs, John T, MD      . haloperidol (HALDOL) tablet 5 mg  5 mg Oral BID Clapacs, Jackquline Denmark, MD   5 mg at 11/03/20 8756  . hydrOXYzine (ATARAX/VISTARIL) tablet 50 mg  50 mg Oral TID PRN Clapacs, Jackquline Denmark,  MD   50 mg at 11/03/20 0230  . magnesium  hydroxide (MILK OF MAGNESIA) suspension 30 mL  30 mL Oral Daily PRN Clapacs, John T, MD      . traZODone (DESYREL) tablet 50 mg  50 mg Oral QHS PRN Clapacs, Jackquline DenmarkJohn T, MD   50 mg at 11/02/20 2113  . ziprasidone (GEODON) injection 20 mg  20 mg Intramuscular Q8H PRN Jesse SansFreeman, Shilee Biggs M, MD       PTA Medications: Medications Prior to Admission  Medication Sig Dispense Refill Last Dose  . ARIPiprazole ER (ABILIFY MAINTENA) 400 MG SRER injection Inject 2 mLs (400 mg total) into the muscle every 28 (twenty-eight) days. 1 each 1   . benztropine (COGENTIN) 2 MG tablet Take 1 tablet (2 mg total) by mouth 2 (two) times daily as needed for tremors (spitting). 60 tablet 1   . haloperidol (HALDOL) 5 MG tablet Take 1 tablet (5 mg total) by mouth 2 (two) times daily. 60 tablet 1   . traZODone (DESYREL) 50 MG tablet Take 1 tablet (50 mg total) by mouth at bedtime as needed for sleep. 30 tablet 1     Musculoskeletal: Strength & Muscle Tone: within normal limits Gait & Station: normal Patient leans: N/A  Psychiatric Specialty Exam: Physical Exam Vitals and nursing note reviewed.  Constitutional:      Appearance: She is obese.  HENT:     Head: Normocephalic and atraumatic.     Right Ear: External ear normal.     Left Ear: External ear normal.     Nose: Nose normal.     Mouth/Throat:     Mouth: Mucous membranes are moist.     Pharynx: Oropharynx is clear.  Eyes:     Extraocular Movements: Extraocular movements intact.     Conjunctiva/sclera: Conjunctivae normal.     Pupils: Pupils are equal, round, and reactive to light.  Cardiovascular:     Rate and Rhythm: Normal rate.     Pulses: Normal pulses.  Pulmonary:     Effort: Pulmonary effort is normal.     Breath sounds: Normal breath sounds.  Abdominal:     General: Abdomen is flat.     Palpations: Abdomen is soft.  Musculoskeletal:        General: No swelling. Normal range of motion.     Cervical back:  Normal range of motion and neck supple.  Skin:    General: Skin is warm and dry.  Neurological:     General: No focal deficit present.     Mental Status: She is alert and oriented to person, place, and time.  Psychiatric:        Attention and Perception: She is inattentive.        Mood and Affect: Mood is anxious. Affect is blunt.        Speech: Speech is delayed.        Behavior: Behavior is slowed and withdrawn.        Thought Content: Thought content is paranoid.        Cognition and Memory: Cognition is impaired. Memory is impaired.        Judgment: Judgment is impulsive.     Review of Systems  Constitutional: Positive for activity change. Negative for fatigue.  HENT: Negative for rhinorrhea and sore throat.   Eyes: Negative for photophobia and visual disturbance.  Respiratory: Negative for cough and shortness of breath.   Cardiovascular: Negative for chest pain and palpitations.  Gastrointestinal: Negative for constipation, diarrhea, nausea and vomiting.  Endocrine: Negative for cold intolerance and heat intolerance.  Genitourinary: Negative for difficulty urinating and dysuria.  Musculoskeletal: Negative for arthralgias and myalgias.  Skin: Negative for rash and wound.  Allergic/Immunologic: Negative for environmental allergies and food allergies.  Neurological: Negative for dizziness and light-headedness.  Hematological: Negative for adenopathy. Does not bruise/bleed easily.  Psychiatric/Behavioral: Positive for behavioral problems, decreased concentration, hallucinations and sleep disturbance. The patient is nervous/anxious.     Blood pressure 136/69, pulse 91, temperature 98.4 F (36.9 C), temperature source Oral, resp. rate 18, height  (1.626 m), weight 125.6 kg, SpO2 99 %.Body mass index is 47.55 kg/m.  General Appearance: Disheveled  Eye Contact:  Minimal  Speech:  Slow  Volume:  Decreased  Mood:  Anxious  Affect:  Blunt and Inappropriate  Thought Process:   Disorganized  Orientation:  Full (Time, Place, and Person)  Thought Content:  Delusions and Paranoid Ideation  Suicidal Thoughts:  No  Homicidal Thoughts:  No  Memory:  Immediate;   Fair Recent;   Poor Remote;   Poor  Judgement:  Impaired  Insight:  Shallow  Psychomotor Activity:  Decreased  Concentration:  Concentration: Poor and Attention Span: Poor  Recall:  Poor  Fund of Knowledge:  Fair  Language:  Fair  Akathisia:  No  Handed:  Right  AIMS (if indicated):     Assets:  Desire for Improvement Financial Resources/Insurance Housing Physical Health Resilience  ADL's:  Impaired  Cognition:  Impaired,  Mild  Sleep:          Treatment Plan Summary: PLAN OF CARE: Daily contact with patient to assess and evaluate symptoms and progress in treatment, Medication management and Plan89 year old woman with chronic psychotic disorder presents to the hospital in acute psychosis. Continue IVC. Restart oral Abilify 10 mg daily, continue Haldol 5 mg BID. Plan to transition back to Abilify Maintenna 400 mg IM monthly injection.   Observation Level/Precautions:  15 minute checks  Laboratory:  Completed in ED  Psychotherapy:    Medications:    Consultations:    Discharge Concerns:    Estimated LOS:  Other:     Physician Treatment Plan for Primary Diagnosis: Bipolar affective disorder, current episode manic (HCC) Long Term Goal(s): Improvement in symptoms so as ready for discharge  Short Term Goals: Ability to identify changes in lifestyle to reduce recurrence of condition will improve, Ability to verbalize feelings will improve, Ability to disclose and discuss suicidal ideas, Ability to demonstrate self-control will improve, Ability to identify and develop effective coping behaviors will improve and Compliance with prescribed medications will improve  Physician Treatment Plan for Secondary Diagnosis: Principal Problem:   Bipolar affective disorder, current episode manic (HCC) Active  Problems:   Noncompliance with medications   Delusion (HCC)  Long Term Goal(s): Improvement in symptoms so as ready for discharge  Short Term Goals: Ability to identify changes in lifestyle to reduce recurrence of condition will improve, Ability to verbalize feelings will improve, Ability to disclose and discuss suicidal ideas, Ability to demonstrate self-control will improve, Ability to identify and develop effective coping behaviors will improve and Compliance with prescribed medications will improve  I certify that inpatient services furnished can reasonably be expected to improve the patient's condition.    Jesse Sans, MD 12/9/20211:46 PM

## 2020-11-03 NOTE — Progress Notes (Signed)
Cooperative with treatment and medication compliant. Patient remains guarded on approach . She denies SI, HI &AVH. She is currently resting in bed quietly.

## 2020-11-03 NOTE — Progress Notes (Signed)
Recreation Therapy Notes  INPATIENT RECREATION TR PLAN  Patient Details Name: Tracy Poole MRN: [id number] DOB: 01/06/1996 Today's Date: 11/03/2020  Rec Therapy Plan Is patient appropriate for Therapeutic Recreation?: Yes Treatment times per week: at least 3 Estimated Length of Stay: 5-7 days TR Treatment/Interventions: Group participation (Comment)  Discharge Criteria    Discharge Summary     Yadira Hada 11/03/2020, 3:25 PM
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
## 2020-11-03 NOTE — Plan of Care (Signed)
D- Patient alert and oriented. Patient presents in a sad, but pleasant mood on assessment stating that she slept good, however, she did state that she needed sleep medication. Patient had no major complaints/concerns to voice at this time. Patient denies SI, HI, AVH, and pain at this time.  Patient also denies any signs/symptoms of depression and anxiety stating that overall, she is "feeling good". Patient's goal for today is "going home", in which she will "do what I need to", in order to achieve her goal.  A- Scheduled medications administered to patient, per MD orders. Support and encouragement provided.  Routine safety checks conducted every 15 minutes.  Patient informed to notify staff with problems or concerns.  R- No adverse drug reactions noted. Patient contracts for safety at this time. Patient compliant with medications and treatment plan. Patient receptive, calm, and cooperative. Patient interacts well with others on the unit.  Patient remains safe at this time.  Problem: Education: Goal: Knowledge of General Education information will improve Description: Including pain rating scale, medication(s)/side effects and non-pharmacologic comfort measures Outcome: Progressing   Problem: Health Behavior/Discharge Planning: Goal: Ability to manage health-related needs will improve Outcome: Progressing   Problem: Clinical Measurements: Goal: Ability to maintain clinical measurements within normal limits will improve Outcome: Progressing Goal: Will remain free from infection Outcome: Progressing Goal: Diagnostic test results will improve Outcome: Progressing Goal: Respiratory complications will improve Outcome: Progressing Goal: Cardiovascular complication will be avoided Outcome: Progressing   Problem: Activity: Goal: Risk for activity intolerance will decrease Outcome: Progressing   Problem: Nutrition: Goal: Adequate nutrition will be maintained Outcome: Progressing   Problem:  Coping: Goal: Level of anxiety will decrease Outcome: Progressing   Problem: Elimination: Goal: Will not experience complications related to bowel motility Outcome: Progressing Goal: Will not experience complications related to urinary retention Outcome: Progressing   Problem: Pain Managment: Goal: General experience of comfort will improve Outcome: Progressing   Problem: Safety: Goal: Ability to remain free from injury will improve Outcome: Progressing   Problem: Skin Integrity: Goal: Risk for impaired skin integrity will decrease Outcome: Progressing   Problem: Activity: Goal: Will verbalize the importance of balancing activity with adequate rest periods Outcome: Progressing   Problem: Education: Goal: Will be free of psychotic symptoms Outcome: Progressing Goal: Knowledge of the prescribed therapeutic regimen will improve Outcome: Progressing   Problem: Coping: Goal: Coping ability will improve Outcome: Progressing Goal: Will verbalize feelings Outcome: Progressing   Problem: Health Behavior/Discharge Planning: Goal: Compliance with prescribed medication regimen will improve Outcome: Progressing   Problem: Nutritional: Goal: Ability to achieve adequate nutritional intake will improve Outcome: Progressing   Problem: Role Relationship: Goal: Ability to communicate needs accurately will improve Outcome: Progressing Goal: Ability to interact with others will improve Outcome: Progressing   Problem: Safety: Goal: Ability to redirect hostility and anger into socially appropriate behaviors will improve Outcome: Progressing Goal: Ability to remain free from injury will improve Outcome: Progressing   Problem: Self-Care: Goal: Ability to participate in self-care as condition permits will improve Outcome: Progressing   Problem: Self-Concept: Goal: Will verbalize positive feelings about self Outcome: Progressing

## 2020-11-03 NOTE — [facility] Suicide Risk Assessment (Signed)
[facility] INPATIENT:  Family/Significant Other Suicide Prevention Education  Suicide Prevention Education:  Patient Refusal for Family/Significant Other Suicide Prevention Education: The patient Tracy Poole has refused to provide written consent for family/significant other to be provided Family/Significant Other Suicide Prevention Education during admission and/or prior to discharge.  Physician notified.  SPE completed with pt, as pt refused to consent to family contact. SPI pamphlet provided to pt and pt was encouraged to share information with support network, ask questions, and talk about any concerns relating to SPE. Pt denies access to guns/firearms and verbalized understanding of information provided. Mobile Crisis information also provided to pt.  Glenis Smoker 11/03/2020, 11:00 AM

## 2020-11-03 NOTE — Progress Notes (Signed)
Patient calm and cooperative during assessment denying SI/HI/AVH and anxiety. Patient unsure about depression. Patient given education, support, and encouragement to be active in her treatment plan. Patient compliant with medication administration per MD orders. Patient observed by this writer interacting appropriately with staff and peers on the unit. Patient being monitored Q 15 minutes for safety per unit protocol. Pt remains safe on the unit. 

## 2020-11-03 NOTE — [facility] Group Notes (Signed)
LCSW Group Therapy Note  11/03/2020 1:22 PM  Type of Therapy/Topic:  Group Therapy:  Balance in Life  Participation Level:  Did Not Attend  Description of Group:    This group will address the concept of balance and how it feels and looks when one is unbalanced. Patients will be encouraged to process areas in their lives that are out of balance and identify reasons for remaining unbalanced. Facilitators will guide patients in utilizing problem-solving interventions to address and correct the stressor making their life unbalanced. Understanding and applying boundaries will be explored and addressed for obtaining and maintaining a balanced life. Patients will be encouraged to explore ways to assertively make their unbalanced needs known to significant others in their lives, using other group members and facilitator for support and feedback.  Therapeutic Goals: 1. Patient will identify two or more emotions or situations they have that consume much of in their lives. 2. Patient will identify signs/triggers that life has become out of balance:  3. Patient will identify two ways to set boundaries in order to achieve balance in their lives:  4. Patient will demonstrate ability to communicate their needs through discussion and/or role plays  Summary of Patient Progress: X  Therapeutic Modalities:   Cognitive Behavioral Therapy Solution-Focused Therapy Assertiveness Training  Simona Huh R. Algis Greenhouse, MSW, LCSW, LCAS 11/03/2020 1:22 PM

## 2020-11-03 NOTE — Plan of Care (Signed)
Patient denies anxiety with this writer   Problem: Coping: Goal: Level of anxiety will decrease Outcome: Progressing

## 2020-11-03 NOTE — B[facility] Suicide Risk Assessment (Signed)
[facility] Admission Suicide Risk Assessment   Nursing information obtained from:  Patient Demographic factors:  Adolescent or young adult,Unemployed Current Mental Status:  NA Loss Factors:  NA Historical Factors:  NA Risk Reduction Factors:  Responsible for children under 24 years of age,Living with another person, especially a relative  Total Time spent with patient: 45 minutes Principal Problem: Bipolar affective disorder, current episode manic (HCC) Diagnosis:  Principal Problem:   Bipolar affective disorder, current episode manic (HCC) Active Problems:   Noncompliance with medications   Delusion (HCC)  Subjective Data: Tracy Poole is a 24 year old female with history of bipolar affective disorder who present to hospital under IVC from [facility] representative due to psychosis. No acute overnight events, patient medication compliant. Today she states that she is here to restart her medication that help "keep her together" so she can rest. She continues to claim she is the girlfriend of Eston Esters. She denies being aggressive to her mother stating they get along well. Patient denies suicidal ideations, homicidal ideations, visual hallucinations, and auditory hallucinations. However, patient is very guarded and appears to be responding to internal stimuli. She refuses to be in her room with the lights off, and is seen sleeping in the dayroom instead of her room when the television is on. It appears she is trying to drown out her internal auditory hallucinations with the television.   Continued Clinical Symptoms:  Alcohol Use Disorder Identification Test Final Score (AUDIT): 1 The "Alcohol Use Disorders Identification Test", Guidelines for Use in Primary Care, Second Edition.  World Science writer [facility]). Score between 0-7:  no or low risk or alcohol related problems. Score between 8-15:  moderate risk of alcohol related problems. Score between 16-19:  high risk of alcohol related problems. Score  20 or above:  warrants further diagnostic evaluation for alcohol dependence and treatment.   CLINICAL FACTORS:   Schizophrenia:   Paranoid or undifferentiated type Currently Psychotic Unstable or Poor Therapeutic Relationship Previous Psychiatric Diagnoses and Treatments   Musculoskeletal: Strength & Muscle Tone: within normal limits Gait & Station: normal Patient leans: N/A  Psychiatric Specialty Exam: Physical Exam Vitals and nursing note reviewed.  Constitutional:      Appearance: She is obese.  HENT:     Head: Normocephalic and atraumatic.     Right Ear: External ear normal.     Left Ear: External ear normal.     Nose: Nose normal.     Mouth/Throat:     Mouth: Mucous membranes are moist.     Pharynx: Oropharynx is clear.  Eyes:     Extraocular Movements: Extraocular movements intact.     Conjunctiva/sclera: Conjunctivae normal.     Pupils: Pupils are equal, round, and reactive to light.  Cardiovascular:     Rate and Rhythm: Normal rate.     Pulses: Normal pulses.  Pulmonary:     Effort: Pulmonary effort is normal.     Breath sounds: Normal breath sounds.  Abdominal:     General: Abdomen is flat.     Palpations: Abdomen is soft.  Musculoskeletal:        General: No swelling. Normal range of motion.     Cervical back: Normal range of motion and neck supple.  Skin:    General: Skin is warm and dry.  Neurological:     General: No focal deficit present.     Mental Status: She is alert and oriented to person, place, and time.  Psychiatric:  Attention and Perception: She is inattentive.        Mood and Affect: Mood is anxious. Affect is blunt.        Speech: Speech is delayed.        Behavior: Behavior is slowed and withdrawn.        Thought Content: Thought content is paranoid.        Cognition and Memory: Cognition is impaired. Memory is impaired.        Judgment: Judgment is impulsive.     Review of Systems  Constitutional: Positive for activity  change. Negative for fatigue.  HENT: Negative for rhinorrhea and sore throat.   Eyes: Negative for photophobia and visual disturbance.  Respiratory: Negative for cough and shortness of breath.   Cardiovascular: Negative for chest pain and palpitations.  Gastrointestinal: Negative for constipation, diarrhea, nausea and vomiting.  Endocrine: Negative for cold intolerance and heat intolerance.  Genitourinary: Negative for difficulty urinating and dysuria.  Musculoskeletal: Negative for arthralgias and myalgias.  Skin: Negative for rash and wound.  Allergic/Immunologic: Negative for environmental allergies and food allergies.  Neurological: Negative for dizziness and light-headedness.  Hematological: Negative for adenopathy. Does not bruise/bleed easily.  Psychiatric/Behavioral: Positive for behavioral problems, decreased concentration, hallucinations and sleep disturbance. The patient is nervous/anxious.     Blood pressure 136/69, pulse 91, temperature 98.4 F (36.9 C), temperature source Oral, resp. rate 18, height 5\' 4"  (1.626 m), weight 125.6 kg, SpO2 99 %.Body mass index is 47.55 kg/m.  General Appearance: Disheveled  Eye Contact:  Minimal  Speech:  Slow  Volume:  Decreased  Mood:  Anxious  Affect:  Blunt and Inappropriate  Thought Process:  Disorganized  Orientation:  Full (Time, Place, and Person)  Thought Content:  Delusions and Paranoid Ideation  Suicidal Thoughts:  No  Homicidal Thoughts:  No  Memory:  Immediate;   Fair Recent;   Poor Remote;   Poor  Judgement:  Impaired  Insight:  Shallow  Psychomotor Activity:  Decreased  Concentration:  Concentration: Poor and Attention Span: Poor  Recall:  Poor  Fund of Knowledge:  Fair  Language:  Fair  Akathisia:  No  Handed:  Right  AIMS (if indicated):     Assets:  Desire for Improvement Financial Resources/Insurance Housing Physical Health Resilience  ADL's:  Impaired  Cognition:  Impaired,  Mild  Sleep:          COGNITIVE FEATURES THAT CONTRIBUTE TO RISK:  Loss of executive function    SUICIDE RISK:   Mild:  Suicidal ideation of limited frequency, intensity, duration, and specificity.  There are no identifiable plans, no associated intent, mild dysphoria and related symptoms, good self-control (both objective and subjective assessment), few other risk factors, and identifiable protective factors, including available and accessible social support.  PLAN OF CARE: Daily contact with patient to assess and evaluate symptoms and progress in treatment, Medication management and Plan 24 year old woman with chronic psychotic disorder presents to the hospital in acute psychosis. Continue IVC. Restart oral Abilify 10 mg daily, continue Haldol 5 mg BID. Plan to transition back to Abilify Maintenna 400 mg IM monthly injection.   I certify that inpatient services furnished can reasonably be expected to improve the patient's condition.   30, MD 11/03/2020, 1:46 PM

## 2020-11-03 NOTE — Progress Notes (Signed)
Recreation Therapy Notes  Date: 11/03/2020  Time: 9:30 am   Location: Craft room   Behavioral response: N/A   Intervention Topic: Animal Assisted therapy    Discussion/Intervention: Patient did not attend group.   Clinical Observations/Feedback:  Patient did not attend group.   Edythe Riches LRT/CTRS        Tracy Poole 11/03/2020 11:33 AM 

## 2020-11-03 NOTE — Progress Notes (Signed)
Recreation Therapy Notes  INPATIENT RECREATION THERAPY ASSESSMENT  Patient Details Name: Tracy Poole MRN: [id number] DOB: [birth year] Today's Date: 11/03/2020       Information Obtained From: Patient  Able to Participate in Assessment/Interview: Yes  Patient Presentation: Responsive  Reason for Admission (Per Patient): Patient Unable to Identify  Patient Stressors:    Coping Skills:   Other (Comment) (None)  Leisure Interests (2+):  Individual - TV  Frequency of Recreation/Participation:    Awareness of Community Resources:     [employer]:     Current Use:    If no, Barriers?:    Expressed Interest in [employer] Information: No  County [place] Residence:  [place]  Patient Main Form of Transportation: Set designer  Patient Strengths:  Everything  Patient Identified Areas of Improvement:  N/A  Patient Goal for Hospitalization:  Going home  Current SI (including self-harm):  No  Current HI:  No  Current AVH: No  Staff Intervention Plan: Group Attendance,Collaborate with Interdisciplinary Treatment Team  Consent to Intern Participation: N/A  Dalyn Kjos 11/03/2020, 3:23 PM

## 2020-11-04 MED ORDER — ARIPIPRAZOLE 10 MG PO TABS
10.0000 mg | ORAL_TABLET | Freq: Every day | ORAL | Status: DC
  Administered 2020-11-05 – 2020-11-07 (×3): 10 mg via ORAL
  Filled 2020-11-04 (×3): qty 1

## 2020-11-04 MED ORDER — NICOTINE 7 MG/24HR TD PT24
7.0000 mg | MEDICATED_PATCH | Freq: Every day | TRANSDERMAL | Status: DC
  Administered 2020-11-04 – 2020-11-05 (×2): 7 mg via TRANSDERMAL
  Filled 2020-11-04 (×3): qty 1

## 2020-11-04 MED ORDER — ARIPIPRAZOLE ER 400 MG IM SRER
400.0000 mg | INTRAMUSCULAR | Status: DC
  Administered 2020-11-04: 400 mg via INTRAMUSCULAR
  Filled 2020-11-04 (×2): qty 2

## 2020-11-04 NOTE — Progress Notes (Signed)
D: Pt alert and oriented. Pt rates depression 0/10, hopelessness 0/10, and anxiety 0/10. Pt goal: "Going Home." Pt reports energy level as normal and concentration as being good. Pt reports sleep last night as being good. Pt did not receive medications for sleep. Pt denies experiencing any pain at this time. Pt denies experiencing any SI/HI, or AVH at this time. Pt has been calm and cooperative. Pt has requested a nicotine patch this evening. MD has been informed and placed orders for the patch.  A: Scheduled medications administered to pt, per MD orders. Support and encouragement provided. Frequent verbal contact made. Routine safety checks conducted q15 minutes.   R: No adverse drug reactions noted. Pt verbally contracts for safety at this time. Pt complaint with medications. Pt interacts minimally with others on the unit, however can be observed in the common areas outside of room. Pt remains safe at this time. Will continue to monitor.

## 2020-11-04 NOTE — Progress Notes (Signed)
[facility] MD Progress Note  11/04/2020 10:54 AM Tracy Poole  MRN:  [id number]   Subjective:  Tracy Poole is a 24 year old female with history of bipolar affective disorder who present to hospital under IVC from [facility] representative due to psychosis. No acute events overnight, patient medication compliant.   Patient seen today during treatment team and again one-on-one at bedside. She remains paranoid and guarded on exam with minimal eye-contact. She tends to answer with just a "yes" or "no" and does not provide any details on events leading up to admission. She simply states her mother must have been concerned about her, but she is unsure why citing she simply watches TV all day. She continues to ask about discharge home. She is agreeable to restarting her Abilify Maintenna injection, and continuing her oral medications.   Principal Problem: Bipolar affective disorder, current episode manic (HCC) Diagnosis: Principal Problem:   Bipolar affective disorder, current episode manic (HCC) Active Problems:   Noncompliance with medications   Delusion (HCC)  Total Time spent with patient: 30 minutes  Past Psychiatric History: History of psychotic disorder both diagnosed variously as bipolar disorder and schizophrenia. Likely, schizoaffective disorder, bipolar type. Last known admission was last January. Tends to have a similar presentation of getting aggressive and bizarre at home. History of recurrent noncompliance with treatment. No known suicide attempts. Concern has been expressed in the past of her mental state making it difficult for her to safely care for her  child. Last year she was discharged on a combination of Abilify and Haldol  Past Medical History:  Past Medical History:  Diagnosis Date  . Anxiety   . Asthma   . Depression   . Schizophrenia [facility])     Past Surgical History:  Procedure Laterality Date  . CESAREAN SECTION    . EUSTACHIAN TUBE DILATION    . HERNIA REPAIR      Family History:  Family History  Problem Relation Age of Onset  . Bladder Cancer Neg Hx   . Kidney cancer Neg Hx    Family Psychiatric  History: None reported Social History:  Social History   Substance and Sexual Activity  Alcohol Use Yes   Comment: occasionallly     Social History   Substance and Sexual Activity  Drug Use No    Social History   Socioeconomic History  . Marital status: Single    Spouse name: Not on file  . Number of children: Not on file  . Years of education: Not on file  . Highest education level: Not on file  Occupational History  . Not on file  Tobacco Use  . Smoking status: Former Smoker    Packs/day: 0.25    Types: Cigarettes  . Smokeless tobacco: Never Used  Vaping Use  . Vaping Use: Never used  Substance and Sexual Activity  . Alcohol use: Yes    Comment: occasionallly  . Drug use: No  . Sexual activity: Yes    Birth control/protection: None  Other Topics Concern  . Not on file  Social History Narrative  . Not on file   Social Determinants of Health   Financial Resource Strain: Not on file  Food Insecurity: Not on file  Transportation Needs: Not on file  Physical Activity: Not on file  Stress: Not on file  Social Connections: Not on file   Additional Social History:  Sleep: Fair  Appetite:  Fair  Current Medications: Current Facility-Administered Medications  Medication Dose Route Frequency Provider Last Rate Last Admin  . acetaminophen (TYLENOL) tablet 650 mg  650 mg Oral Q6H PRN Clapacs, John T, MD      . alum & mag hydroxide-simeth (MAALOX/MYLANTA) 200-200-20 MG/5ML suspension 30 mL  30 mL Oral Q4H PRN Clapacs, Jackquline Denmark, MD      . Melene Muller ON 11/05/2020] ARIPiprazole (ABILIFY) tablet 10 mg  10 mg Oral Daily Jesse Sans, MD      . ARIPiprazole ER (ABILIFY MAINTENA) injection 400 mg  400 mg Intramuscular Q28 days Jesse Sans, MD      . benztropine (COGENTIN) tablet 2 mg  2 mg  Oral BID PRN Clapacs, John T, MD      . haloperidol (HALDOL) tablet 5 mg  5 mg Oral BID Clapacs, Jackquline Denmark, MD   5 mg at 11/04/20 0802  . hydrOXYzine (ATARAX/VISTARIL) tablet 50 mg  50 mg Oral TID PRN Clapacs, Jackquline Denmark, MD   50 mg at 11/03/20 0230  . magnesium hydroxide (MILK OF MAGNESIA) suspension 30 mL  30 mL Oral Daily PRN Clapacs, John T, MD      . traZODone (DESYREL) tablet 50 mg  50 mg Oral QHS PRN Clapacs, Jackquline Denmark, MD   50 mg at 11/03/20 2120  . ziprasidone (GEODON) injection 20 mg  20 mg Intramuscular Q8H PRN Jesse Sans, MD        Lab Results: No results found for this or any previous visit (from the past 48 hour(s)).  Blood Alcohol level:  Lab Results  Component Value Date   ETH <10 11/01/2020   ETH <10 01/07/2020    Metabolic Disorder Labs: Lab Results  Component Value Date   HGBA1C 5.4 11/01/2020   MPG 108.28 11/01/2020   MPG 99.67 11/28/2019   Lab Results  Component Value Date   PROLACTIN 53.4 (H) 03/28/2017   Lab Results  Component Value Date   CHOL 175 11/01/2020   TRIG 52 11/01/2020   HDL 43 11/01/2020   CHOLHDL 4.1 11/01/2020   VLDL 10 11/01/2020   LDLCALC 122 (H) 11/01/2020   LDLCALC 107 (H) 11/28/2019    Physical Findings: AIMS:  , ,  ,  ,    CIWA:    COWS:     Musculoskeletal: Strength & Muscle Tone: within normal limits Gait & Station: normal Patient leans: N/A  Psychiatric Specialty Exam: Physical Exam Vitals and nursing note reviewed.  Constitutional:      Appearance: She is obese.  HENT:     Head: Normocephalic and atraumatic.     Right Ear: External ear normal.     Left Ear: External ear normal.     Nose: Nose normal.     Mouth/Throat:     Mouth: Mucous membranes are moist.     Pharynx: Oropharynx is clear.  Eyes:     Extraocular Movements: Extraocular movements intact.     Conjunctiva/sclera: Conjunctivae normal.     Pupils: Pupils are equal, round, and reactive to light.  Cardiovascular:     Rate and Rhythm: Normal rate.      Pulses: Normal pulses.  Pulmonary:     Effort: Pulmonary effort is normal.     Breath sounds: Normal breath sounds.  Abdominal:     General: Abdomen is flat.     Palpations: Abdomen is soft.  Musculoskeletal:        General: No swelling. Normal range  of motion.     Cervical back: Normal range of motion and neck supple.  Skin:    General: Skin is warm and dry.  Neurological:     General: No focal deficit present.     Mental Status: She is alert and oriented to person, place, and time.  Psychiatric:        Attention and Perception: She does not perceive auditory or visual hallucinations.        Mood and Affect: Mood is depressed. Affect is flat.        Speech: Speech is delayed.        Behavior: Behavior is withdrawn.        Thought Content: Thought content is paranoid and delusional.        Cognition and Memory: Cognition normal. Memory is impaired.        Judgment: Judgment is impulsive.     Review of Systems  Constitutional: Negative for appetite change and fatigue.  HENT: Negative for rhinorrhea and sore throat.   Eyes: Negative for photophobia and visual disturbance.  Respiratory: Negative for cough and shortness of breath.   Cardiovascular: Negative for chest pain and palpitations.  Gastrointestinal: Negative for constipation, diarrhea, nausea and vomiting.  Endocrine: Negative for cold intolerance and heat intolerance.  Genitourinary: Negative for difficulty urinating and dysuria.  Musculoskeletal: Negative for arthralgias and myalgias.  Skin: Negative for rash and wound.  Allergic/Immunologic: Negative for environmental allergies.  Neurological: Negative for dizziness and headaches.  Hematological: Negative for adenopathy. Does not bruise/bleed easily.  Psychiatric/Behavioral: Positive for behavioral problems, decreased concentration and dysphoric mood. Negative for suicidal ideas.    Blood pressure 136/74, pulse 86, temperature 97.7 F (36.5 C), temperature source  Oral, resp. rate 18, height 5\' 4"  (1.626 m), weight 125.6 kg, SpO2 99 %.Body mass index is 47.55 kg/m.  General Appearance: Casual  Eye Contact:  Minimal  Speech:  Slow  Volume:  Normal  Mood:  Depressed  Affect:  Blunt  Thought Process:  Coherent  Orientation:  Full (Time, Place, and Person)  Thought Content:  Delusions and Paranoid Ideation  Suicidal Thoughts:  No  Homicidal Thoughts:  No  Memory:  Immediate;   Poor Recent;   Poor Remote;   Poor  Judgement:  Impaired  Insight:  Lacking  Psychomotor Activity:  Decreased  Concentration:  Concentration: Poor and Attention Span: Poor  Recall:  Poor  Fund of Knowledge:  Poor  Language:  Poor  Akathisia:  Negative  Handed:  Right  AIMS (if indicated):     Assets:  Desire for Improvement Financial Resources/Insurance Housing Physical Health Resilience Social Support  ADL's:  Impaired  Cognition:  Impaired,  Mild  Sleep:  Number of Hours: 8.5     Treatment Plan Summary: Daily contact with patient to assess and evaluate symptoms and progress in treatment, Medication management and Plan53 year old woman with chronic psychotic disorder presents to the hospital in acute psychosis. Continue IVC. Abilify Maintenna 400 mg IM injection today, continue oral Abilify 10 mg for 14 days (until 11/18/20), continue Haldol 5 mg BID.   11/20/20, MD 11/04/2020, 10:54 AM

## 2020-11-04 NOTE — Plan of Care (Signed)
Patient denying anxiety with this writer  Problem: Coping: Goal: Level of anxiety will decrease Outcome: Progressing

## 2020-11-04 NOTE — Progress Notes (Signed)
Patient calm and cooperative during assessment denying SI/HI/AVH and anxiety. Patient unsure about depression. Patient given education, support, and encouragement to be active in her treatment plan. Patient compliant with medication administration per MD orders. Patient observed by this Clinical research associate interacting appropriately with staff and peers on the unit. Patient being monitored Q 15 minutes for safety per unit protocol. Pt remains safe on the unit.

## 2020-11-04 NOTE — Tx Team (Signed)
Interdisciplinary Treatment and Diagnostic Plan Update  11/04/2020 Time of Session: 9:00AM Tracy Poole MRN: [id number]  Principal Diagnosis: Bipolar affective disorder, current episode manic ([place])  Secondary Diagnoses: Principal Problem:   Bipolar affective disorder, current episode manic ([place]) Active Problems:   Noncompliance with medications   Delusion ([place])   Current Medications:  Current Facility-Administered Medications  Medication Dose Route Frequency Provider Last Rate Last Admin  . acetaminophen (TYLENOL) tablet 650 mg  650 mg Oral Q6H PRN Clapacs, John T, MD      . alum & mag hydroxide-simeth (MAALOX/MYLANTA) 200-200-20 MG/5ML suspension 30 mL  30 mL Oral Q4H PRN Clapacs, [name], MD      . Derrill Memo ON 11/05/2020] ARIPiprazole (ABILIFY) tablet 10 mg  10 mg Oral Daily Salley [name], MD      . ARIPiprazole ER (ABILIFY MAINTENA) injection 400 mg  400 mg Intramuscular Q28 days Salley [name], MD      . benztropine (COGENTIN) tablet 2 mg  2 mg Oral BID PRN Clapacs, John T, MD      . haloperidol (HALDOL) tablet 5 mg  5 mg Oral BID Clapacs, [name], MD   5 mg at 11/04/20 0802  . hydrOXYzine (ATARAX/VISTARIL) tablet 50 mg  50 mg Oral TID PRN Clapacs, [name], MD   50 mg at 11/03/20 0230  . magnesium hydroxide (MILK OF MAGNESIA) suspension 30 mL  30 mL Oral Daily PRN Clapacs, John T, MD      . traZODone (DESYREL) tablet 50 mg  50 mg Oral QHS PRN Clapacs, [name], MD   50 mg at 11/03/20 2120  . ziprasidone (GEODON) injection 20 mg  20 mg Intramuscular Q8H PRN Salley [name], MD       PTA Medications: Medications Prior to Admission  Medication Sig Dispense Refill Last Dose  . ARIPiprazole ER (ABILIFY MAINTENA) 400 MG SRER injection Inject 2 mLs (400 mg total) into the muscle every 28 (twenty-eight) days. 1 each 1   . benztropine (COGENTIN) 2 MG tablet Take 1 tablet (2 mg total) by mouth 2 (two) times daily as needed for tremors (spitting). 60 tablet 1   . haloperidol (HALDOL) 5  MG tablet Take 1 tablet (5 mg total) by mouth 2 (two) times daily. 60 tablet 1   . traZODone (DESYREL) 50 MG tablet Take 1 tablet (50 mg total) by mouth at bedtime as needed for sleep. 30 tablet 1     Patient Stressors: Health problems Medication change or noncompliance  Patient Strengths: Motivation for treatment/growth Supportive family/friends  Treatment Modalities: Medication Management, Group therapy, Case management,  1 to 1 session with clinician, Psychoeducation, Recreational therapy.   Physician Treatment Plan for Primary Diagnosis: Bipolar affective disorder, current episode manic ([place]) Long Term Goal(s): Improvement in symptoms so as ready for discharge Improvement in symptoms so as ready for discharge   Short Term Goals: Ability to identify changes in lifestyle to reduce recurrence of condition will improve Ability to verbalize feelings will improve Ability to disclose and discuss suicidal ideas Ability to demonstrate self-control will improve Ability to identify and develop effective coping behaviors will improve Compliance with prescribed medications will improve Ability to identify changes in lifestyle to reduce recurrence of condition will improve Ability to verbalize feelings will improve Ability to disclose and discuss suicidal ideas Ability to demonstrate self-control will improve Ability to identify and develop effective coping behaviors will improve Compliance with prescribed medications will improve  Medication Management: Evaluate patient's response, side effects,  and tolerance of medication regimen.  Therapeutic Interventions: 1 to 1 sessions, Unit Group sessions and Medication administration.  Evaluation of Outcomes: Not Met  Physician Treatment Plan for Secondary Diagnosis: Principal Problem:   Bipolar affective disorder, current episode manic ([place]) Active Problems:   Noncompliance with medications   Delusion ([place])  Long Term Goal(s): Improvement  in symptoms so as ready for discharge Improvement in symptoms so as ready for discharge   Short Term Goals: Ability to identify changes in lifestyle to reduce recurrence of condition will improve Ability to verbalize feelings will improve Ability to disclose and discuss suicidal ideas Ability to demonstrate self-control will improve Ability to identify and develop effective coping behaviors will improve Compliance with prescribed medications will improve Ability to identify changes in lifestyle to reduce recurrence of condition will improve Ability to verbalize feelings will improve Ability to disclose and discuss suicidal ideas Ability to demonstrate self-control will improve Ability to identify and develop effective coping behaviors will improve Compliance with prescribed medications will improve     Medication Management: Evaluate patient's response, side effects, and tolerance of medication regimen.  Therapeutic Interventions: 1 to 1 sessions, Unit Group sessions and Medication administration.  Evaluation of Outcomes: Not Met   RN Treatment Plan for Primary Diagnosis: Bipolar affective disorder, current episode manic ([place]) Long Term Goal(s): Knowledge of disease and therapeutic regimen to maintain health will improve  Short Term Goals: Ability to verbalize frustration and anger appropriately will improve, Ability to demonstrate self-control, Ability to participate in decision making will improve, Ability to identify and develop effective coping behaviors will improve and Compliance with prescribed medications will improve  Medication Management: RN will administer medications as ordered by provider, will assess and evaluate patient's response and provide education to patient for prescribed medication. RN will report any adverse and/or side effects to prescribing provider.  Therapeutic Interventions: 1 on 1 counseling sessions, Psychoeducation, Medication administration, Evaluate  responses to treatment, Monitor vital signs and CBGs as ordered, Perform/monitor CIWA, COWS, AIMS and Fall Risk screenings as ordered, Perform wound care treatments as ordered.  Evaluation of Outcomes: Not Met   LCSW Treatment Plan for Primary Diagnosis: Bipolar affective disorder, current episode manic ([place]) Long Term Goal(s): Safe transition to appropriate next level of care at discharge, Engage patient in therapeutic group addressing interpersonal concerns.  Short Term Goals: Engage patient in aftercare planning with referrals and resources, Increase social support, Increase ability to appropriately verbalize feelings, Facilitate acceptance of mental health diagnosis and concerns, Identify triggers associated with mental health/substance abuse issues and Increase skills for wellness and recovery  Therapeutic Interventions: Assess for all discharge needs, 1 to 1 time with Social worker, Explore available resources and support systems, Assess for adequacy in community support network, Educate family and significant other(s) on suicide prevention, Complete Psychosocial Assessment, Interpersonal group therapy.  Evaluation of Outcomes: Not Met   Progress in Treatment: Attending groups: No. Participating in groups: No. Taking medication as prescribed: Yes. Toleration medication: Yes. Family/Significant other contact made: No, will contact:  when given permission.  Patient understands diagnosis: Yes. Discussing patient identified problems/goals with staff: Yes. Medical problems stabilized or resolved: Yes. Denies suicidal/homicidal ideation: Yes. Issues/concerns per patient self-inventory: No. Other: None.  New problem(s) identified: No, Describe:  none.  New Short Term/Long Term Goal(s): medication management for mood stabilization; development of comprehensive mental wellness/sobriety plan.  Patient Goals: "Going home."    Discharge Plan or Barriers: Patient expresses desire to return  home with follow up through [place].  Reason for Continuation of Hospitalization: Delusions  Medication stabilization  Estimated Length of Stay: 1-7 days  Attendees: Patient: Nadene Witherspoon 11/04/2020 10:22 AM  Physician: Selina Cooley, MD 11/04/2020 10:22 AM  Nursing: Graceann Congress, RN 11/04/2020 10:22 AM  RN Care Manager: 11/04/2020 10:22 AM  Social Worker: Chalmers Guest. Guerry Bruin, MSW, [place], LCAS 11/04/2020 10:22 AM  Recreational Therapist: Devin Going, LRT  11/04/2020 10:22 AM  Other: Assunta Curtis, MSW, LCSW 11/04/2020 10:22 AM  Other:  11/04/2020 10:22 AM  Other: 11/04/2020 10:22 AM    Scribe for Treatment Team: Shirl Harris, LCSW 11/04/2020 10:22 AM

## 2020-11-04 NOTE — Progress Notes (Signed)
Recreation Therapy Notes  Date: 11/04/2020  Time: 9:30 am   Location: Craft room     Behavioral response: N/A   Intervention Topic: Decision Making   Discussion/Intervention: Patient did not attend group.   Clinical Observations/Feedback:  Patient did not attend group.   Makailee Nudelman LRT/CTRS         Emmanuelle Coxe 11/04/2020 12:30 PM

## 2020-11-05 NOTE — Plan of Care (Signed)
Patient is pleasant and cooperative on approach.Patient denies SI,HI and AVH.Stated that she is ready to go home.Visible in the milieu.Minimal interactions with staff & peers.Compliant with medications.Attended groups partially.Appetite and energy level good.Support and encouragement given.

## 2020-11-05 NOTE — Plan of Care (Signed)
?  Problem: Coping: ?Goal: Level of anxiety will decrease ?Outcome: Progressing ?  ?Problem: Safety: ?Goal: Ability to remain free from injury will improve ?Outcome: Progressing ?  ?

## 2020-11-05 NOTE — Progress Notes (Signed)
Patient pleasant and cooperative. Denies SI, HI,AVH. No paranoia noted. Noted on phone and in dayroom watching tv. Minimal interaction with peers. Medication compliant. Trazodone given for sleep. No relief noted.  Writer encouraged patient to go and lay down and rest eyes.  Encouragement and support provided, safety checks maintained. Medications given as prescribed. Pt receptive and remains safe on unit with q 15 min checks.

## 2020-11-05 NOTE — Progress Notes (Signed)
[facility] MD Progress Note  11/05/2020 9:48 AM Tracy Poole  MRN:  [id number]  Principal Problem: Bipolar affective disorder, current episode manic (HCC) Diagnosis: Principal Problem:   Bipolar affective disorder, current episode manic (HCC) Active Problems:   Noncompliance with medications   Delusion (HCC)  Tracy Poole is a 24 y.o. female who presents to the [facility] unit for treatment of bipolar disorder in the context of worsened psychosis.   Interval History Patient was seen today for re-evaluation.  Nursing reports no events overnight. The patient has no issues with performing ADLs.  Patient has been medication compliant.    Subjective:  On assessment patient reports "I am okay". She denies any complaints. She wants to go home. She reports no side effects after yesterday`s LAI shot. She reports "okay" mood, denies thoughts of harming self or others, does not express any delusional content, denies hallucinations. She still appears guarded on exam with minimal eye-contact and gives short "yes-no" answers for the most part.    Labs: no new results for review.   Total Time spent with patient: 15 minutes  Past Psychiatric History: see H&P  Past Medical History:  Past Medical History:  Diagnosis Date  . Anxiety   . Asthma   . Depression   . Schizophrenia [facility])     Past Surgical History:  Procedure Laterality Date  . CESAREAN SECTION    . EUSTACHIAN TUBE DILATION    . HERNIA REPAIR     Family History:  Family History  Problem Relation Age of Onset  . Bladder Cancer Neg Hx   . Kidney cancer Neg Hx    Family Psychiatric  History: see H&P Social History:  Social History   Substance and Sexual Activity  Alcohol Use Yes   Comment: occasionallly     Social History   Substance and Sexual Activity  Drug Use No    Social History   Socioeconomic History  . Marital status: Single    Spouse name: Not on file  . Number of children: Not on file  . Years of education: Not on file   . Highest education level: Not on file  Occupational History  . Not on file  Tobacco Use  . Smoking status: Former Smoker    Packs/day: 0.25    Types: Cigarettes  . Smokeless tobacco: Never Used  Vaping Use  . Vaping Use: Never used  Substance and Sexual Activity  . Alcohol use: Yes    Comment: occasionallly  . Drug use: No  . Sexual activity: Yes    Birth control/protection: None  Other Topics Concern  . Not on file  Social History Narrative  . Not on file   Social Determinants of Health   Financial Resource Strain: Not on file  Food Insecurity: Not on file  Transportation Needs: Not on file  Physical Activity: Not on file  Stress: Not on file  Social Connections: Not on file   Additional Social History:                         Sleep: Good  Appetite:  Good  Current Medications: Current Facility-Administered Medications  Medication Dose Route Frequency Provider Last Rate Last Admin  . acetaminophen (TYLENOL) tablet 650 mg  650 mg Oral Q6H PRN Clapacs, John T, MD      . alum & mag hydroxide-simeth (MAALOX/MYLANTA) 200-200-20 MG/5ML suspension 30 mL  30 mL Oral Q4H PRN Clapacs, Jackquline Denmark, MD      .  ARIPiprazole (ABILIFY) tablet 10 mg  10 mg Oral Daily Jesse Sans, MD   10 mg at 11/05/20 0750  . ARIPiprazole ER (ABILIFY MAINTENA) injection 400 mg  400 mg Intramuscular Q28 days Jesse Sans, MD   400 mg at 11/04/20 1306  . benztropine (COGENTIN) tablet 2 mg  2 mg Oral BID PRN Clapacs, John T, MD      . haloperidol (HALDOL) tablet 5 mg  5 mg Oral BID Clapacs, Jackquline Denmark, MD   5 mg at 11/05/20 0751  . hydrOXYzine (ATARAX/VISTARIL) tablet 50 mg  50 mg Oral TID PRN Clapacs, Jackquline Denmark, MD   50 mg at 11/03/20 0230  . magnesium hydroxide (MILK OF MAGNESIA) suspension 30 mL  30 mL Oral Daily PRN Clapacs, John T, MD      . nicotine (NICODERM CQ - dosed in mg/24 hr) patch 7 mg  7 mg Transdermal Daily Jesse Sans, MD   7 mg at 11/05/20 0750  . traZODone (DESYREL)  tablet 50 mg  50 mg Oral QHS PRN Clapacs, Jackquline Denmark, MD   50 mg at 11/04/20 2144  . ziprasidone (GEODON) injection 20 mg  20 mg Intramuscular Q8H PRN Jesse Sans, MD        Lab Results: No results found for this or any previous visit (from the past 48 hour(s)).  Blood Alcohol level:  Lab Results  Component Value Date   ETH <10 11/01/2020   ETH <10 01/07/2020    Metabolic Disorder Labs: Lab Results  Component Value Date   HGBA1C 5.4 11/01/2020   MPG 108.28 11/01/2020   MPG 99.67 11/28/2019   Lab Results  Component Value Date   PROLACTIN 53.4 (H) 03/28/2017   Lab Results  Component Value Date   CHOL 175 11/01/2020   TRIG 52 11/01/2020   HDL 43 11/01/2020   CHOLHDL 4.1 11/01/2020   VLDL 10 11/01/2020   LDLCALC 122 (H) 11/01/2020   LDLCALC 107 (H) 11/28/2019    Physical Findings: AIMS:  , ,  ,  ,    CIWA:    COWS:     Musculoskeletal: Strength & Muscle Tone: within normal limits Gait & Station: normal Patient leans: N/A  Psychiatric Specialty Exam: Physical Exam  Review of Systems  Blood pressure 120/71, pulse 81, temperature 98.5 F (36.9 C), temperature source Oral, resp. rate 18, height 5\' 4"  (1.626 m), weight 125.6 kg, SpO2 98 %.Body mass index is 47.55 kg/m.  General Appearance: Guarded  Eye Contact:  Minimal  Speech:  Blocked  Volume:  Decreased  Mood:  Irritable  Affect:  Constricted  Thought Process:  Coherent  Orientation:  Full (Time, Place, and Person)  Thought Content:  possibly still paranoid; guarded  Suicidal Thoughts:  No  Homicidal Thoughts:  No  Memory:  Immediate;   Fair Recent;   Fair Remote;   NA  Judgement:  Other:  limited, questionable  Insight:  limited  Psychomotor Activity:  Normal  Concentration:  Concentration: Fair and Attention Span: Fair  Recall:  of Knowledge:  Fair  Language:  Fair  Akathisia:  No  Handed:  Right  AIMS (if indicated):     Assets:  Desire for Improvement Physical Health  ADL's:   Intact  Cognition:  WNL  Sleep:  Number of Hours: 8.5     Treatment Plan Summary: Daily contact with patient to assess and evaluate symptoms and progress in treatment and Medication management   Patient is a  24 year old female with the above-stated past psychiatric history who is seen in follow-up.  Chart reviewed. Patient discussed with nursing. Nursing notes mentioned patient observed interacting appropriately with staff and peers on the unit. On assessment today patient continues to appear guarded and limitedly-cooperative, although she denies any unsafe thoughts and doe snot express paranoid content. No side effects after LAI administration yesterday. Will continue current treatment without changes today.      Plan:  -continue inpatient psych admission; 15-minute checks; daily contact with patient to assess and evaluate symptoms and progress in treatment; psychoeducation.  -continue scheduled medications: . ARIPiprazole  10 mg Oral Daily  . ARIPiprazole ER  400 mg Intramuscular Q28 days  . haloperidol  5 mg Oral BID  . nicotine  7 mg Transdermal Daily    -continue PRN medications.  acetaminophen, alum & mag hydroxide-simeth, benztropine, hydrOXYzine, magnesium hydroxide, traZODone, ziprasidone  -Pertinent Labs: no new labs ordered today  -EKG: on 12/8 showed Qtc 428 with normal sinus rhythm    -Consults: No new consults placed since yesterday    -Disposition: Estimated duration of hospitalization: midweek next week. All necessary aftercare will be arranged prior to discharge Likely d/c home with outpatient psych follow-up.  -  I certify that the patient does need, on a daily basis, active treatment furnished directly by or requiring the supervision of inpatient psychiatric facility personnel.   Thalia Party, MD 11/05/2020, 9:48 AM

## 2020-11-05 NOTE — [facility] Group Notes (Signed)
[facility] Group Notes: (Clinical Social Work)   11/05/2020      Type of Therapy:  Group Therapy   Participation Level:  Did Not Attend - was invited individually by Nurse/MHT and chose not to attend.   Yuriy Cui, LCSWA 11/05/2020  3:05 PM      

## 2020-11-06 NOTE — Progress Notes (Signed)
[facility] MD Progress Note  11/06/2020 11:52 AM Danasia Aima Mcwhirt  MRN:  [id number]  Principal Problem: Bipolar affective disorder, current episode manic (HCC) Diagnosis: Principal Problem:   Bipolar affective disorder, current episode manic (HCC) Active Problems:   Noncompliance with medications   Delusion (HCC)  Ms.Freiberger is a 24 y.o. female who presents to the [facility] unit for treatment of bipolar disorder in the context of worsened psychosis.   Interval History Patient was seen today for re-evaluation.  Nursing reports no events overnight. The patient has no issues with performing ADLs.  Patient has been medication compliant.    Subjective:  On assessment patient reports "I am feeling good. I am so ready to go home". She denies any complaints.  She reports "dood" mood, denies feeling depressed, anxious, denies thoughts of harming self or others. She does not express any delusional content, and denies hallucinations. She appears much brighter on exam today with with good eye-contact, answers with full sentences and asks questions herself. She reports no side effects from medications, including after LAI.  Labs: no new results for review.   Total Time spent with patient: 15 minutes  Past Psychiatric History: see H&P  Past Medical History:  Past Medical History:  Diagnosis Date  . Anxiety   . Asthma   . Depression   . Schizophrenia [facility])     Past Surgical History:  Procedure Laterality Date  . CESAREAN SECTION    . EUSTACHIAN TUBE DILATION    . HERNIA REPAIR     Family History:  Family History  Problem Relation Age of Onset  . Bladder Cancer Neg Hx   . Kidney cancer Neg Hx    Family Psychiatric  History: see H&P Social History:  Social History   Substance and Sexual Activity  Alcohol Use Yes   Comment: occasionallly     Social History   Substance and Sexual Activity  Drug Use No    Social History   Socioeconomic History  . Marital status: Single    Spouse name: Not  on file  . Number of children: Not on file  . Years of education: Not on file  . Highest education level: Not on file  Occupational History  . Not on file  Tobacco Use  . Smoking status: Former Smoker    Packs/day: 0.25    Types: Cigarettes  . Smokeless tobacco: Never Used  Vaping Use  . Vaping Use: Never used  Substance and Sexual Activity  . Alcohol use: Yes    Comment: occasionallly  . Drug use: No  . Sexual activity: Yes    Birth control/protection: None  Other Topics Concern  . Not on file  Social History Narrative  . Not on file   Social Determinants of Health   Financial Resource Strain: Not on file  Food Insecurity: Not on file  Transportation Needs: Not on file  Physical Activity: Not on file  Stress: Not on file  Social Connections: Not on file   Additional Social History:                         Sleep: Good  Appetite:  Good  Current Medications: Current Facility-Administered Medications  Medication Dose Route Frequency Provider Last Rate Last Admin  . acetaminophen (TYLENOL) tablet 650 mg  650 mg Oral Q6H PRN Clapacs, John T, MD      . alum & mag hydroxide-simeth (MAALOX/MYLANTA) 200-200-20 MG/5ML suspension 30 mL  30 mL Oral Q4H  PRN Clapacs, Jackquline Denmark, MD      . ARIPiprazole (ABILIFY) tablet 10 mg  10 mg Oral Daily Jesse Sans, MD   10 mg at 11/06/20 0755  . ARIPiprazole ER (ABILIFY MAINTENA) injection 400 mg  400 mg Intramuscular Q28 days Jesse Sans, MD   400 mg at 11/04/20 1306  . benztropine (COGENTIN) tablet 2 mg  2 mg Oral BID PRN Clapacs, John T, MD      . haloperidol (HALDOL) tablet 5 mg  5 mg Oral BID Clapacs, Jackquline Denmark, MD   5 mg at 11/06/20 0755  . hydrOXYzine (ATARAX/VISTARIL) tablet 50 mg  50 mg Oral TID PRN Clapacs, Jackquline Denmark, MD   50 mg at 11/03/20 0230  . magnesium hydroxide (MILK OF MAGNESIA) suspension 30 mL  30 mL Oral Daily PRN Clapacs, John T, MD      . nicotine (NICODERM CQ - dosed in mg/24 hr) patch 7 mg  7 mg  Transdermal Daily Jesse Sans, MD   7 mg at 11/05/20 0750  . traZODone (DESYREL) tablet 50 mg  50 mg Oral QHS PRN Clapacs, John T, MD   50 mg at 11/05/20 2046  . ziprasidone (GEODON) injection 20 mg  20 mg Intramuscular Q8H PRN Jesse Sans, MD        Lab Results: No results found for this or any previous visit (from the past 48 hour(s)).  Blood Alcohol level:  Lab Results  Component Value Date   ETH <10 11/01/2020   ETH <10 01/07/2020    Metabolic Disorder Labs: Lab Results  Component Value Date   HGBA1C 5.4 11/01/2020   MPG 108.28 11/01/2020   MPG 99.67 11/28/2019   Lab Results  Component Value Date   PROLACTIN 53.4 (H) 03/28/2017   Lab Results  Component Value Date   CHOL 175 11/01/2020   TRIG 52 11/01/2020   HDL 43 11/01/2020   CHOLHDL 4.1 11/01/2020   VLDL 10 11/01/2020   LDLCALC 122 (H) 11/01/2020   LDLCALC 107 (H) 11/28/2019    Physical Findings: AIMS:  , ,  ,  ,    CIWA:    COWS:     Musculoskeletal: Strength & Muscle Tone: within normal limits Gait & Station: normal Patient leans: N/A  Psychiatric Specialty Exam: Physical Exam   Review of Systems   Blood pressure 120/61, pulse 85, temperature 98.7 F (37.1 C), temperature source Oral, resp. rate 19, height 5\' 4"  (1.626 m), weight 125.6 kg, SpO2 99 %.Body mass index is 47.55 kg/m.  General Appearance: casual  Eye Contact:  WNL  Speech:  WNL  Volume:  WNL  Mood:  Calm, denies feeling depressed  Affect:  Constricted  Thought Process:  Coherent  Orientation:  Full (Time, Place, and Person)  Thought Content:  Does not express paranoid thoughts  Suicidal Thoughts:  No  Homicidal Thoughts:  No  Memory:  Immediate;   Fair Recent;   Fair Remote;   NA  Judgement:  fair  Insight:  limited  Psychomotor Activity:  Normal  Concentration:  Concentration: Fair and Attention Span: Fair  Recall:  of Knowledge:  Fair  Language:  Fair  Akathisia:  No  Handed:  Right  AIMS (if  indicated):     Assets:  Desire for Improvement Physical Health  ADL's:  Intact  Cognition:  WNL  Sleep:  Number of Hours: 8     Treatment Plan Summary: Daily contact with patient to assess and  evaluate symptoms and progress in treatment and Medication management   Patient is a 24 year old female with the above-stated past psychiatric history who is seen in follow-up.  Chart reviewed. Patient discussed with nursing. Nursing notes mentioned patient observed interacting appropriately with staff and peers on the unit. On assessment today patient appear brighter, more cooperative, denies any unsafe thoughts and does not express paranoid content. No side effects after LAI administration. Will continue current treatment without changes today.      Plan:  -continue inpatient psych admission; 15-minute checks; daily contact with patient to assess and evaluate symptoms and progress in treatment; psychoeducation.  -continue scheduled medications: . ARIPiprazole  10 mg Oral Daily  . ARIPiprazole ER  400 mg Intramuscular Q28 days  . haloperidol  5 mg Oral BID  . nicotine  7 mg Transdermal Daily    -continue PRN medications.  acetaminophen, alum & mag hydroxide-simeth, benztropine, hydrOXYzine, magnesium hydroxide, traZODone, ziprasidone  -Pertinent Labs: no new labs ordered today  -EKG: on 12/8 showed Qtc 428 with normal sinus rhythm    -Consults: No new consults placed since yesterday    -Disposition: Estimated duration of hospitalization: early-midweek next week. All necessary aftercare will be arranged prior to discharge Likely d/c home with outpatient psych follow-up.  -  I certify that the patient does need, on a daily basis, active treatment furnished directly by or requiring the supervision of inpatient psychiatric facility personnel.   Thalia Party, MD 11/06/2020, 11:52 AM

## 2020-11-06 NOTE — Plan of Care (Signed)
D- Patient alert and oriented. Patient presents in a pleasant mood on assessment stating that she slept good last night and had no complaints to voice to this Clinical research associate. Patient denies SI, HI, AVH, and pain at this time. Patient also denies any signs/symptoms of depression/anxiety, stating that overall, she is feeling "good, I'm ready to go". Patient's goal for today is "going home" in which she will "do what I need to do", in order to achieve her goal.  A- Scheduled medications administered to patient, per MD orders. Support and encouragement provided.  Routine safety checks conducted every 15 minutes.  Patient informed to notify staff with problems or concerns.  R- No adverse drug reactions noted. Patient contracts for safety at this time. Patient compliant with medications and treatment plan. Patient receptive, calm, and cooperative. Patient interacts well with others on the unit.  Patient remains safe at this time.  Problem: Education: Goal: Knowledge of General Education information will improve Description: Including pain rating scale, medication(s)/side effects and non-pharmacologic comfort measures Outcome: Progressing   Problem: Health Behavior/Discharge Planning: Goal: Ability to manage health-related needs will improve Outcome: Progressing   Problem: Clinical Measurements: Goal: Ability to maintain clinical measurements within normal limits will improve Outcome: Progressing Goal: Will remain free from infection Outcome: Progressing Goal: Diagnostic test results will improve Outcome: Progressing Goal: Respiratory complications will improve Outcome: Progressing Goal: Cardiovascular complication will be avoided Outcome: Progressing   Problem: Activity: Goal: Risk for activity intolerance will decrease Outcome: Progressing   Problem: Nutrition: Goal: Adequate nutrition will be maintained Outcome: Progressing   Problem: Coping: Goal: Level of anxiety will decrease Outcome:  Progressing   Problem: Elimination: Goal: Will not experience complications related to bowel motility Outcome: Progressing Goal: Will not experience complications related to urinary retention Outcome: Progressing   Problem: Pain Managment: Goal: General experience of comfort will improve Outcome: Progressing   Problem: Safety: Goal: Ability to remain free from injury will improve Outcome: Progressing   Problem: Skin Integrity: Goal: Risk for impaired skin integrity will decrease Outcome: Progressing   Problem: Activity: Goal: Will verbalize the importance of balancing activity with adequate rest periods Outcome: Progressing   Problem: Education: Goal: Will be free of psychotic symptoms Outcome: Progressing Goal: Knowledge of the prescribed therapeutic regimen will improve Outcome: Progressing   Problem: Coping: Goal: Coping ability will improve Outcome: Progressing Goal: Will verbalize feelings Outcome: Progressing   Problem: Health Behavior/Discharge Planning: Goal: Compliance with prescribed medication regimen will improve Outcome: Progressing   Problem: Nutritional: Goal: Ability to achieve adequate nutritional intake will improve Outcome: Progressing   Problem: Role Relationship: Goal: Ability to communicate needs accurately will improve Outcome: Progressing Goal: Ability to interact with others will improve Outcome: Progressing   Problem: Safety: Goal: Ability to redirect hostility and anger into socially appropriate behaviors will improve Outcome: Progressing Goal: Ability to remain free from injury will improve Outcome: Progressing   Problem: Self-Care: Goal: Ability to participate in self-care as condition permits will improve Outcome: Progressing   Problem: Self-Concept: Goal: Will verbalize positive feelings about self Outcome: Progressing

## 2020-11-06 NOTE — [facility] Group Notes (Signed)
[facility] Group Notes: (Clinical Social Work)   11/06/2020      Type of Therapy:  Group Therapy   Participation Level:  Did Not Attend - was invited individually by Nurse/MHT and chose not to attend.   Susa Simmonds, LCSWA 11/06/2020  2:52 PM

## 2020-11-06 NOTE — Progress Notes (Signed)
Patient has been observed sleeping throughout the day in her room, which is a major improvement from the past few days. Patient would usually sleep in the dayroom. Patient has been present in the milieu throughout the day, interacting well with no issues.

## 2020-11-07 MED ORDER — HYDROXYZINE HCL 50 MG PO TABS: 50.0000 mg | ORAL_TABLET | Freq: Three times a day (TID) | ORAL | 1 refills | Status: DC | PRN

## 2020-11-07 MED ORDER — TRAZODONE HCL 50 MG PO TABS: 50.0000 mg | ORAL_TABLET | Freq: Every evening | ORAL | 1 refills | Status: DC | PRN

## 2020-11-07 MED ORDER — HALOPERIDOL 5 MG PO TABS: 5.0000 mg | ORAL_TABLET | Freq: Two times a day (BID) | ORAL | 1 refills | Status: DC

## 2020-11-07 MED ORDER — ARIPIPRAZOLE ER 400 MG IM SRER: 400.0000 mg | INTRAMUSCULAR | 1 refills | Status: DC

## 2020-11-07 MED ORDER — BENZTROPINE MESYLATE 2 MG PO TABS: 2.0000 mg | ORAL_TABLET | Freq: Two times a day (BID) | ORAL | 1 refills | Status: DC | PRN

## 2020-11-07 MED ORDER — ARIPIPRAZOLE 10 MG PO TABS: 10.0000 mg | ORAL_TABLET | Freq: Every day | ORAL | 0 refills | Status: DC

## 2020-11-07 MED ORDER — ARIPIPRAZOLE ER 400 MG IM SRER: 400.0000 mg | INTRAMUSCULAR | 0 refills | Status: DC

## 2020-11-07 NOTE — Progress Notes (Signed)
Discharge Note:  Patient denies SI/HI/AVH at this time. Discharge instructions, AVS, prescriptions, and transition record gone over with patient. Patient agrees to comply with medication management, follow-up visit, and outpatient therapy. Patient belongings returned to patient. Patient questions and concerns addressed and answered. Patient ambulatory off unit. Patient discharged to home with Grandma.

## 2020-11-07 NOTE — Plan of Care (Signed)
  Problem: Group Participation Goal: STG - Patient will engage in groups without prompting or encouragement from LRT x3 group sessions within 5 recreation therapy group sessions Description: STG - Patient will engage in groups without prompting or encouragement from LRT x3 group sessions within 5 recreation therapy group sessions 11/07/2020 1423 by Ernest Haber, LRT Outcome: Not Applicable [id number] by Ernest Haber, LRT Outcome: Not Met (add Reason) Note: Patient did not attend any groups.

## 2020-11-07 NOTE — Plan of Care (Signed)
D: Pt denies SI/HI/AVH. She states no depression and anxiety and says she just wants to go home. She says she will "do what I need to" to go home. She did not report any pain or any other physical concerns. She slept well and reports good appetite. She was calm and cooperative and took her medications.   A: Pt was given morning medications as ordered by MD and was educated on her medications. She was given support and encouragement as needed. Q15 safety checks were maintained.  R: Pt remains safe on the unit and is present in the milieu. She interacts well with her peers. Staff will continue to monitor for changes and ensure safety on the unit.   Problem: Education: Goal: Knowledge of General Education information will improve Description: Including pain rating scale, medication(s)/side effects and non-pharmacologic comfort measures Outcome: Progressing   Problem: Health Behavior/Discharge Planning: Goal: Ability to manage health-related needs will improve Outcome: Progressing   Problem: Clinical Measurements: Goal: Ability to maintain clinical measurements within normal limits will improve Outcome: Progressing Goal: Will remain free from infection Outcome: Progressing Goal: Diagnostic test results will improve Outcome: Progressing Goal: Respiratory complications will improve Outcome: Progressing Goal: Cardiovascular complication will be avoided Outcome: Progressing   Problem: Activity: Goal: Risk for activity intolerance will decrease Outcome: Progressing   Problem: Nutrition: Goal: Adequate nutrition will be maintained Outcome: Progressing   Problem: Coping: Goal: Level of anxiety will decrease Outcome: Progressing   Problem: Elimination: Goal: Will not experience complications related to bowel motility Outcome: Progressing Goal: Will not experience complications related to urinary retention Outcome: Progressing   Problem: Pain Managment: Goal: General experience of  comfort will improve Outcome: Progressing   Problem: Safety: Goal: Ability to remain free from injury will improve Outcome: Progressing   Problem: Skin Integrity: Goal: Risk for impaired skin integrity will decrease Outcome: Progressing   Problem: Activity: Goal: Will verbalize the importance of balancing activity with adequate rest periods Outcome: Progressing   Problem: Education: Goal: Will be free of psychotic symptoms Outcome: Progressing Goal: Knowledge of the prescribed therapeutic regimen will improve Outcome: Progressing   Problem: Coping: Goal: Coping ability will improve Outcome: Progressing Goal: Will verbalize feelings Outcome: Progressing   Problem: Health Behavior/Discharge Planning: Goal: Compliance with prescribed medication regimen will improve Outcome: Progressing   Problem: Nutritional: Goal: Ability to achieve adequate nutritional intake will improve Outcome: Progressing   Problem: Role Relationship: Goal: Ability to communicate needs accurately will improve Outcome: Progressing Goal: Ability to interact with others will improve Outcome: Progressing   Problem: Safety: Goal: Ability to redirect hostility and anger into socially appropriate behaviors will improve Outcome: Progressing Goal: Ability to remain free from injury will improve Outcome: Progressing   Problem: Self-Care: Goal: Ability to participate in self-care as condition permits will improve Outcome: Progressing   Problem: Self-Concept: Goal: Will verbalize positive feelings about self Outcome: Progressing

## 2020-11-07 NOTE — Plan of Care (Signed)
  Problem: Education: Goal: Knowledge of General Education information will improve Description: Including pain rating scale, medication(s)/side effects and non-pharmacologic comfort measures Outcome: Progressing   Problem: Health Behavior/Discharge Planning: Goal: Ability to manage health-related needs will improve Outcome: Progressing   Problem: Clinical Measurements: Goal: Ability to maintain clinical measurements within normal limits will improve Outcome: Progressing Goal: Will remain free from infection Outcome: Progressing Goal: Diagnostic test results will improve Outcome: Progressing Goal: Respiratory complications will improve Outcome: Progressing Goal: Cardiovascular complication will be avoided Outcome: Progressing   Problem: Activity: Goal: Risk for activity intolerance will decrease Outcome: Progressing   Problem: Nutrition: Goal: Adequate nutrition will be maintained Outcome: Progressing   Problem: Coping: Goal: Level of anxiety will decrease Outcome: Progressing   Problem: Elimination: Goal: Will not experience complications related to bowel motility Outcome: Progressing Goal: Will not experience complications related to urinary retention Outcome: Progressing   Problem: Pain Managment: Goal: General experience of comfort will improve Outcome: Progressing   Problem: Safety: Goal: Ability to remain free from injury will improve Outcome: Progressing   Problem: Skin Integrity: Goal: Risk for impaired skin integrity will decrease Outcome: Progressing   Problem: Activity: Goal: Will verbalize the importance of balancing activity with adequate rest periods Outcome: Progressing   Problem: Education: Goal: Will be free of psychotic symptoms Outcome: Progressing Goal: Knowledge of the prescribed therapeutic regimen will improve Outcome: Progressing   Problem: Coping: Goal: Coping ability will improve Outcome: Progressing Goal: Will verbalize  feelings Outcome: Progressing   Problem: Health Behavior/Discharge Planning: Goal: Compliance with prescribed medication regimen will improve Outcome: Progressing   Problem: Nutritional: Goal: Ability to achieve adequate nutritional intake will improve Outcome: Progressing   Problem: Role Relationship: Goal: Ability to communicate needs accurately will improve Outcome: Progressing Goal: Ability to interact with others will improve Outcome: Progressing   Problem: Safety: Goal: Ability to redirect hostility and anger into socially appropriate behaviors will improve Outcome: Progressing Goal: Ability to remain free from injury will improve Outcome: Progressing   Problem: Self-Care: Goal: Ability to participate in self-care as condition permits will improve Outcome: Progressing   Problem: Self-Concept: Goal: Will verbalize positive feelings about self Outcome: Progressing   

## 2020-11-07 NOTE — Progress Notes (Signed)
  [facility] Adult Case Management Discharge Plan :  Will you be returning to the same living situation after discharge:  Yes,  pt plans to return home. At discharge, do you have transportation home?: Yes,  pt mother to provide transportation home. Do you have the ability to pay for your medications: Yes,  [employer].  Release of information consent forms completed and in the chart;  Patient's signature needed at discharge.  Patient to Follow up at:  Follow-up Information    [facility] Go on 11/11/2020.   Why: Your appointment is scheduled for 11/11/20 at 12:30. This is an in-person appointment. Please remember to bring your prescriptions with you. Contact information: [address] [place] [address] [phone]               Next level of care provider has access to [facility] Link:no  Safety Planning and Suicide Prevention discussed: Yes,  SPE completed with pt as she declined family/collateral contacts.   Have you used any form of tobacco in the last 30 days? (Cigarettes, Smokeless Tobacco, Cigars, and/or Pipes): Yes  Has patient been referred to the Quitline?: Patient refused referral  Patient has been referred for addiction treatment: Pt. refused referral  Glenis Smoker, LCSW 11/07/2020, 10:57 AM

## 2020-11-07 NOTE — Progress Notes (Signed)
Patient is pleasant and cooperative.  She has been active in the dayroom and gets along well with her peers.  She denies SI  HI  AVH depression  anxiety and pain at this encounter. She reports doing well and feels she is ready to be released. She is med compliant and tolerated her meds without incident. She is safe on the unit with 15 minute safety checks and encouraged to contact staff with any concerns.    Cleo Butler-Nicholson, LPN

## 2020-11-07 NOTE — Discharge Summary (Signed)
Physician Discharge Summary Note  Patient:  Tracy Poole is an 24 y.o., female MRN:  [id number] DOB:  01-04-1996 Patient phone:  [phone] (home)  Patient address:   [address] [place] [address],  Total Time spent with patient: 30 minutes  Date of Admission:  11/02/2020 Date of Discharge: 11/07/2020  Reason for Admission:  Tracy Poole is a 24 year old female with history of bipolar affective disorder who present to hospital under IVC from [facility] representative due to psychosis.  Principal Problem: Bipolar affective disorder, current episode manic Tracy Poole) Discharge Diagnoses: Principal Problem:   Bipolar affective disorder, current episode manic (Tracy Poole) Active Problems:   Noncompliance with medications   Delusion (Tracy Poole)   Past Psychiatric History:  History of psychotic disorder both diagnosed variously as bipolar disorder and schizophrenia. Likely, schizoaffective disorder, bipolar type. Last known admission was last January. Tends to have a similar presentation of getting aggressive and bizarre at home. History of recurrent noncompliance with treatment. No known suicide attempts. Concern has been expressed in the past of her mental state making it difficult for her to safely care for her  child. Last year she was discharged on a combination of Abilify and Haldol  Past Medical History:  Past Medical History:  Diagnosis Date  . Anxiety   . Asthma   . Depression   . Schizophrenia [facility])     Past Surgical History:  Procedure Laterality Date  . CESAREAN SECTION    . EUSTACHIAN TUBE DILATION    . HERNIA REPAIR     Family History:  Family History  Problem Relation Age of Onset  . Bladder Cancer Neg Hx   . Kidney cancer Neg Hx    Family Psychiatric  History: None reported Social History:  Social History   Substance and Sexual Activity  Alcohol Use Yes   Comment: occasionallly     Social History   Substance and Sexual Activity  Drug Use No    Social History    Socioeconomic History  . Marital status: Single    Spouse name: Not on file  . Number of children: Not on file  . Years of education: Not on file  . Highest education level: Not on file  Occupational History  . Not on file  Tobacco Use  . Smoking status: Former Smoker    Packs/day: 0.25    Types: Cigarettes  . Smokeless tobacco: Never Used  Vaping Use  . Vaping Use: Never used  Substance and Sexual Activity  . Alcohol use: Yes    Comment: occasionallly  . Drug use: No  . Sexual activity: Yes    Birth control/protection: None  Other Topics Concern  . Not on file  Social History Narrative  . Not on file   Social Determinants of Poole   Financial Resource Strain: Not on file  Food Insecurity: Not on file  Transportation Needs: Not on file  Physical Activity: Not on file  Stress: Not on file  Social Connections: Not on file    Hospital Course:  Tracy Poole is a 24 year old female with history of bipolar affective disorder who present to hospital under IVC from [facility] representative due to psychosis. Prior to admission she felt she was Tracy Poole's girlfriend, and needing to travel back and forth to Tracy Poole daily. Also reports of aggression towards mother. On admission she was guarded, and appeared to be responding to internal stimuli. She refused to be in her room with the lights off, and was seen  sleeping in the dayroom instead of her room with the television on. It appeared she was trying to drown out internal stimuli with television noise. She was restarted on Haldol 5 mg BID, and oral Abilify 10 mg daily. She received Abilify Maintenna LAI 400 mg IM injection on 11/04/20, and next injection will be due on 12/03/2019. During her stay she gradually improved. She began sleeping in her own room. She would smile appropriately, and speak more to providers. Her mother, Tracy Poole was contacted prior to discharge. She was able to speak with Tracy Poole during her stay and did feel she had  gone back to her baseline. She felt safe with Tracy Poole returning home with her, and would be able to give her a ride. Mother shared she was in the process of trying to obtain guardianship over Tracy Poole, and did mention that she decompensates without the long-acting injectable. We reviewed Yaritzi's current medication list. At time of discharge patient denied suicidal ideations, homicidal ideations, visual hallucinations, and auditory hallucinations. Patient, patient's mother, and treatment team felt she was safe to discharge with outpatient follow-up.   Physical Findings: AIMS: Facial and Oral Movements Muscles of Facial Expression: None, normal Lips and Perioral Area: None, normal Jaw: None, normal Tongue: None, normal,Extremity Movements Upper (arms, wrists, hands, fingers): None, normal Lower (legs, knees, ankles, toes): None, normal, Trunk Movements Neck, shoulders, hips: None, normal, Overall Severity Severity of abnormal movements (highest score from questions above): None, normal Incapacitation due to abnormal movements: None, normal Patient's awareness of abnormal movements (rate only patient's report): No Awareness, Dental Status Current problems with teeth and/or dentures?: No Does patient usually wear dentures?: No  CIWA:    COWS:     Musculoskeletal: Strength & Muscle Tone: within normal limits Gait & Station: normal Patient leans: N/A  Psychiatric Specialty Exam: Physical Exam Vitals and nursing note reviewed.  Constitutional:      Appearance: Normal appearance.  HENT:     Head: Normocephalic and atraumatic.     Right Ear: External ear normal.     Left Ear: External ear normal.     Nose: Nose normal.     Mouth/Throat:     Mouth: Mucous membranes are moist.     Pharynx: Oropharynx is clear.  Eyes:     Extraocular Movements: Extraocular movements intact.     Conjunctiva/sclera: Conjunctivae normal.     Pupils: Pupils are equal, round, and reactive to light.   Cardiovascular:     Rate and Rhythm: Normal rate.     Pulses: Normal pulses.  Pulmonary:     Effort: Pulmonary effort is normal.     Breath sounds: Normal breath sounds.  Abdominal:     General: Abdomen is flat.     Palpations: Abdomen is soft.  Musculoskeletal:        General: No swelling. Normal range of motion.     Cervical back: Normal range of motion and neck supple.  Skin:    General: Skin is warm and dry.  Neurological:     General: No focal deficit present.     Mental Status: She is alert and oriented to person, place, and time.  Psychiatric:        Mood and Affect: Mood normal.        Behavior: Behavior normal.        Thought Content: Thought content normal.        Judgment: Judgment normal.     Review of Systems  Constitutional: Negative for activity change  and fatigue.  HENT: Negative for rhinorrhea and sore throat.   Eyes: Negative for photophobia and visual disturbance.  Respiratory: Negative for cough and shortness of breath.   Cardiovascular: Negative for chest pain and palpitations.  Gastrointestinal: Negative for constipation, diarrhea, nausea and vomiting.  Endocrine: Negative for cold intolerance and heat intolerance.  Genitourinary: Negative for difficulty urinating and dysuria.  Musculoskeletal: Negative for arthralgias and myalgias.  Skin: Negative for rash and wound.  Allergic/Immunologic: Negative for environmental allergies and food allergies.  Neurological: Negative for dizziness and headaches.  Hematological: Negative for adenopathy. Does not bruise/bleed easily.  Psychiatric/Behavioral: Negative for behavioral problems, sleep disturbance and suicidal ideas. The patient is not hyperactive.     Blood pressure 121/69, pulse 81, temperature 98.6 F (37 C), temperature source Oral, resp. rate 18, height 5\' 4"  (1.626 m), weight 125.6 kg, SpO2 99 %.Body mass index is 47.55 kg/m.  General Appearance: Fairly Groomed  ::  Fair  Speech:   Clear and Coherent  Volume:  Normal  Mood:  Euthymic  Affect:  Congruent  Thought Process:  Coherent and Linear  Orientation:  Full (Time, Place, and Person)  Thought Content:  Logical  Suicidal Thoughts:  No  Homicidal Thoughts:  No  Memory:  Immediate;   Fair Recent;   Fair Remote;   Fair  Judgement:  Intact  Insight:  Shallow  Psychomotor Activity:  Normal  Concentration:  Fair  Recall:  Fair  Fund of Knowledge:Fair  Language: Fair  Akathisia:  Negative  Handed:  Right  AIMS (if indicated):     Assets:  Communication Skills Desire for Improvement Financial Resources/Insurance Housing Resilience Social Support  Sleep:  Number of Hours: 6  Cognition: WNL  ADL's:  Intact        Have you used any form of tobacco in the last 30 days? (Cigarettes, Smokeless Tobacco, Cigars, and/or Pipes): Yes  Has this patient used any form of tobacco in the last 30 days? (Cigarettes, Smokeless Tobacco, Cigars, and/or Pipes) Yes, Yes, A prescription for an FDA-approved tobacco cessation medication was offered at discharge and the patient refused  Blood Alcohol level:  Lab Results  Component Value Date   [facility] <10 11/01/2020   ETH <10 01/07/2020    Metabolic Disorder Labs:  Lab Results  Component Value Date   HGBA1C 5.4 11/01/2020   MPG 108.28 11/01/2020   MPG 99.67 11/28/2019   Lab Results  Component Value Date   PROLACTIN 53.4 (H) 03/28/2017   Lab Results  Component Value Date   CHOL 175 11/01/2020   TRIG 52 11/01/2020   HDL 43 11/01/2020   CHOLHDL 4.1 11/01/2020   VLDL 10 11/01/2020   LDLCALC 122 (H) 11/01/2020   LDLCALC 107 (H) 11/28/2019    See Psychiatric Specialty Exam and Suicide Risk Assessment completed by Attending Physician prior to discharge.  Discharge destination:  Home  Is patient on multiple antipsychotic therapies at discharge:  Yes,   Do you recommend tapering to monotherapy for antipsychotics?  No   Has Patient had three or more failed trials of  antipsychotic monotherapy by history:  Yes,   Antipsychotic medications that previously failed include:   1.  Abilify. and 2.  Haldol.  Recommended Plan for Multiple Antipsychotic Therapies: NA  Discharge Instructions    Diet general   Complete by: As directed    Increase activity slowly   Complete by: As directed      Allergies as of 11/07/2020   No Known Allergies  Medication List    STOP taking these medications   ARIPiprazole ER 400 MG Srer injection Commonly known as: ABILIFY MAINTENA Replaced by: ARIPiprazole 10 MG tablet     TAKE these medications     Indication  ARIPiprazole 10 MG tablet Commonly known as: ABILIFY Take 1 tablet (10 mg total) by mouth daily. Start taking on: November 08, 2020 Replaces: ARIPiprazole ER 400 MG Srer injection  Indication: Manic Phase of Manic-Depression   ARIPiprazole ER 400 MG Srer injection Commonly known as: ABILIFY MAINTENA Inject 2 mLs (400 mg total) into the muscle every 28 (twenty-eight) days. Start taking on: December 02, 2020  Indication: Manic-Depression   benztropine 2 MG tablet Commonly known as: COGENTIN Take 1 tablet (2 mg total) by mouth 2 (two) times daily as needed for tremors (spitting).  Indication: Extrapyramidal Reaction caused by Medications   haloperidol 5 MG tablet Commonly known as: HALDOL Take 1 tablet (5 mg total) by mouth 2 (two) times daily.  Indication: Psychosis   hydrOXYzine 50 MG tablet Commonly known as: ATARAX/VISTARIL Take 1 tablet (50 mg total) by mouth 3 (three) times daily as needed for anxiety.  Indication: Feeling Anxious   traZODone 50 MG tablet Commonly known as: DESYREL Take 1 tablet (50 mg total) by mouth at bedtime as needed for sleep.  Indication: Trouble Sleeping       Follow-up Information    [facility] Go on 11/11/2020.   Why: Your appointment is scheduled for 11/11/20 at 12:30. This is an in-person appointment. Please remember to bring your  prescriptions with you. Contact information: [address] [place] [address] [phone]               Follow-up recommendations:  Activity:  as tolerated Diet:  regular diet  Comments:  30-day scripts with one refill sent to [facility] [address]   Signed: Jesse SansMegan M Jalal Rauch, MD 11/07/2020, 11:07 AM

## 2020-11-07 NOTE — Progress Notes (Signed)
Recreation Therapy Notes  INPATIENT RECREATION TR PLAN  Patient Details Name: Tracy Poole MRN: [id number] DOB: [birth year] Today's Date: 11/07/2020  Rec Therapy Plan Is patient appropriate for Therapeutic Recreation?: Yes Treatment times per week: at least 3 Estimated Length of Stay: 5-7 days TR Treatment/Interventions: Group participation (Comment)  Discharge Criteria Pt will be discharged from therapy if:: Discharged Treatment plan/goals/alternatives discussed and agreed upon by:: Patient/family  Discharge Summary Short term goals set: Patient will engage in groups without prompting or encouragement from LRT x3 group sessions within 5 recreation therapy group sessions Short term goals met: Not met Reason goals not met: Patient did not attend any groups. Therapeutic equipment acquired: N/A Reason patient discharged from therapy: Discharge from hospital Pt/family agrees with progress & goals achieved: Yes Date patient discharged from therapy: 11/07/20   Marlane Hirschmann 11/07/2020, 2:25 PM

## 2020-11-07 NOTE — BHH Suicide Risk Assessment (Signed)
[facility] Discharge Suicide Risk Assessment   Principal Problem: Bipolar affective disorder, current episode manic [facility]) Discharge Diagnoses: Principal Problem:   Bipolar affective disorder, current episode manic (HCC) Active Problems:   Noncompliance with medications   Delusion (HCC)   Total Time spent with patient: 30 minutes  Musculoskeletal: Strength & Muscle Tone: within normal limits Gait & Station: normal Patient leans: N/A  Psychiatric Specialty Exam: Review of Systems  Constitutional: Negative for activity change and fatigue.  HENT: Negative for rhinorrhea and sore throat.   Eyes: Negative for photophobia and visual disturbance.  Respiratory: Negative for cough and shortness of breath.   Cardiovascular: Negative for chest pain and palpitations.  Gastrointestinal: Negative for constipation, diarrhea, nausea and vomiting.  Endocrine: Negative for cold intolerance and heat intolerance.  Genitourinary: Negative for difficulty urinating and dysuria.  Musculoskeletal: Negative for arthralgias and myalgias.  Skin: Negative for rash and wound.  Allergic/Immunologic: Negative for environmental allergies and food allergies.  Neurological: Negative for dizziness and headaches.  Hematological: Negative for adenopathy. Does not bruise/bleed easily.  Psychiatric/Behavioral: Negative for behavioral problems, sleep disturbance and suicidal ideas. The patient is not hyperactive.     Blood pressure 121/69, pulse 81, temperature 98.6 F (37 C), temperature source Oral, resp. rate 18, height 5\' 4"  (1.626 m), weight 125.6 kg, SpO2 99 %.Body mass index is 47.55 kg/m.  General Appearance: Fairly Groomed  ::  Fair  Speech:  Clear and Coherent  Volume:  Normal  Mood:  Euthymic  Affect:  Congruent  Thought Process:  Coherent and Linear  Orientation:  Full (Time, Place, and Person)  Thought Content:  Logical  Suicidal Thoughts:  No  Homicidal Thoughts:  No  Memory:  Immediate;    Fair Recent;   Fair Remote;   Fair  Judgement:  Intact  Insight:  Shallow  Psychomotor Activity:  Normal  Concentration:  Fair  Recall:  [phone] of Knowledge:Fair  Language: Fair  Akathisia:  Negative  Handed:  Right  AIMS (if indicated):     Assets:  Communication Skills Desire for Improvement Financial Resources/Insurance Housing Resilience Social Support  Sleep:  Number of Hours: 6  Cognition: WNL  ADL's:  Intact   Mental Status Per Nursing Assessment::   On Admission:  NA  Demographic Factors:  Adolescent or young adult  Loss Factors: NA  Historical Factors: Impulsivity  Risk Reduction Factors:   Responsible for children under 32 years of age, Sense of responsibility to family, Religious beliefs about death, Living with another person, especially a relative, Positive social support, Positive therapeutic relationship and Positive coping skills or problem solving skills  Continued Clinical Symptoms:  Bipolar Disorder:   Mixed State Previous Psychiatric Diagnoses and Treatments  Cognitive Features That Contribute To Risk:  None    Suicide Risk:  Minimal: No identifiable suicidal ideation.  Patients presenting with no risk factors but with morbid ruminations; may be classified as minimal risk based on the severity of the depressive symptoms    Plan Of Care/Follow-up recommendations:  Activity:  as tolearted Diet:  regular diet  15, MD 11/07/2020, 10:44 AM

## 2020-11-07 NOTE — Progress Notes (Signed)
This Clinical research associate was informed by patient that her ride was outside, however, when this Clinical research associate along with patient got upstairs to [employer], patient's family was not there. Patient asked this Clinical research associate to call her Grandmother, and when she answered, this Clinical research associate put the phone on speaker. Patient's grandmother was saying that she thought she was supposed to be discharged at 1pm. Patient's grandmother stated "ok, well I'll be on the way". This Clinical research associate tried to explain to patient that she should come back into the hospital until her ride gets here, and patient stated "I'm not going back downstairs because I'm grown". This Clinical research associate tried to reiterate that she should wait with [place] inside and patient stated "there she go", and walked off. This Clinical research associate called patient's name and she kept walking out into the parking lot and turned the corner. This Clinical research associate notified [facility] and will also notify MD.

## 2021-04-06 ENCOUNTER — Emergency Department
Admission: EM | Admit: 2021-04-06 | Discharge: 2021-04-07 | Disposition: A | Discharge status: Other Acute Inpatient Hospital | Payer: No Typology Code available for payment source | Attending: Emergency Medicine | Admitting: Emergency Medicine

## 2021-04-06 ENCOUNTER — Other Ambulatory Visit: Payer: Self-pay

## 2021-04-06 DIAGNOSIS — Z008 Encounter for other general examination: Secondary | ICD-10-CM

## 2021-04-06 DIAGNOSIS — Z20822 Contact with and (suspected) exposure to covid-19: Secondary | ICD-10-CM | POA: Diagnosis not present

## 2021-04-06 DIAGNOSIS — Z9114 Patient's other noncompliance with medication regimen: Secondary | ICD-10-CM

## 2021-04-06 DIAGNOSIS — Z046 Encounter for general psychiatric examination, requested by authority: Secondary | ICD-10-CM | POA: Diagnosis present

## 2021-04-06 DIAGNOSIS — Z87891 Personal history of nicotine dependence: Secondary | ICD-10-CM | POA: Diagnosis not present

## 2021-04-06 DIAGNOSIS — R4689 Other symptoms and signs involving appearance and behavior: Secondary | ICD-10-CM

## 2021-04-06 DIAGNOSIS — F3163 Bipolar disorder, current episode mixed, severe, without psychotic features: Secondary | ICD-10-CM

## 2021-04-06 DIAGNOSIS — F200 Paranoid schizophrenia: Secondary | ICD-10-CM | POA: Insufficient documentation

## 2021-04-06 DIAGNOSIS — F3110 Bipolar disorder, current episode manic without psychotic features, unspecified: Secondary | ICD-10-CM | POA: Diagnosis not present

## 2021-04-06 DIAGNOSIS — F22 Delusional disorders: Secondary | ICD-10-CM | POA: Diagnosis present

## 2021-04-06 DIAGNOSIS — F319 Bipolar disorder, unspecified: Secondary | ICD-10-CM | POA: Diagnosis present

## 2021-04-06 DIAGNOSIS — O365990 Maternal care for other known or suspected poor fetal growth, unspecified trimester, not applicable or unspecified: Secondary | ICD-10-CM | POA: Diagnosis present

## 2021-04-06 LAB — CBC
HCT: 40.1 % (ref 36.0–46.0)
Hemoglobin: 13.5 g/dL (ref 12.0–15.0)
MCH: 28.8 pg (ref 26.0–34.0)
MCHC: 33.7 g/dL (ref 30.0–36.0)
MCV: 85.5 fL (ref 80.0–100.0)
Platelets: 206 10*3/uL (ref 150–400)
RBC: 4.69 MIL/uL (ref 3.87–5.11)
RDW: 14.1 % (ref 11.5–15.5)
WBC: 9.6 10*3/uL (ref 4.0–10.5)
nRBC: 0 % (ref 0.0–0.2)

## 2021-04-06 LAB — COMPREHENSIVE METABOLIC PANEL
ALT: 37 U/L (ref 0–44)
AST: 32 U/L (ref 15–41)
Albumin: 4 g/dL (ref 3.5–5.0)
Alkaline Phosphatase: 74 U/L (ref 38–126)
Anion gap: 9 (ref 5–15)
BUN: <5 mg/dL — ABNORMAL LOW (ref 6–20)
CO2: 22 mmol/L (ref 22–32)
Calcium: 8.8 mg/dL — ABNORMAL LOW (ref 8.9–10.3)
Chloride: 107 mmol/L (ref 98–111)
Creatinine, Ser: 0.65 mg/dL (ref 0.44–1.00)
GFR, Estimated: >60 mL/min (ref >60)
Glucose, Bld: 101 mg/dL — ABNORMAL HIGH (ref 70–99)
Potassium: 3.4 mmol/L — ABNORMAL LOW (ref 3.5–5.1)
Sodium: 138 mmol/L (ref 135–145)
Total Bilirubin: 0.7 mg/dL (ref 0.3–1.2)
Total Protein: 7.4 g/dL (ref 6.5–8.1)

## 2021-04-06 LAB — SALICYLATE LEVEL: Salicylate Lvl: <7 mg/dL — ABNORMAL LOW (ref 7.0–30.0)

## 2021-04-06 LAB — ACETAMINOPHEN LEVEL: Acetaminophen (Tylenol), Serum: <10 ug/mL — ABNORMAL LOW (ref 10–30)

## 2021-04-06 LAB — RESP PANEL BY RT-PCR (FLU A&B, COVID) ARPGX2
Influenza A by PCR: NEGATIVE
Influenza B by PCR: NEGATIVE
SARS Coronavirus 2 by RT PCR: NEGATIVE

## 2021-04-06 LAB — ETHANOL: Alcohol, Ethyl (B): <10 mg/dL (ref <10)

## 2021-04-06 NOTE — ED Notes (Signed)
Assumed care of pt upon being roomed, denies SI/HI or hallucinations at this time. Reports "my mom drew papers because she is mad I didn't see my kid." Pt reports her child is with the father. AOx4, talking in full sentences with regular and unlabored breathing. Blankets and pillow provided for comfort.

## 2021-04-06 NOTE — ED Triage Notes (Signed)
Pt here under IVC pt denies any SI or HI at this time.

## 2021-04-06 NOTE — ED Notes (Signed)
IVC from [place], pending consult
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
## 2021-04-06 NOTE — ED Notes (Signed)
Belonging removed Cell phone Cigarettes Pink slippers  Socks Black shirt Bonnett Wallace Cullens leggins  Panties All locked in Show Low locker area

## 2021-04-07 DIAGNOSIS — F3163 Bipolar disorder, current episode mixed, severe, without psychotic features: Secondary | ICD-10-CM | POA: Diagnosis present

## 2021-04-07 LAB — URINALYSIS, COMPLETE (UACMP) WITH MICROSCOPIC
Bacteria, UA: NONE SEEN
RBC / HPF: >50 RBC/hpf — ABNORMAL HIGH (ref 0–5)
Specific Gravity, Urine: 1.02 (ref 1.005–1.030)
WBC, UA: NONE SEEN WBC/hpf (ref 0–5)

## 2021-04-07 LAB — URINE DRUG SCREEN, QUALITATIVE (ARMC ONLY)
Amphetamines, Ur Screen: NOT DETECTED
Barbiturates, Ur Screen: NOT DETECTED
Benzodiazepine, Ur Scrn: NOT DETECTED
Cannabinoid 50 Ng, Ur ~~LOC~~: NOT DETECTED
Cocaine Metabolite,Ur ~~LOC~~: NOT DETECTED
MDMA (Ecstasy)Ur Screen: NOT DETECTED
Methadone Scn, Ur: NOT DETECTED
Opiate, Ur Screen: NOT DETECTED
Phencyclidine (PCP) Ur S: NOT DETECTED
Tricyclic, Ur Screen: NOT DETECTED

## 2021-04-07 LAB — POC URINE PREG, ED: Preg Test, Ur: NEGATIVE

## 2021-04-07 NOTE — ED Provider Notes (Signed)
Emergency Medicine Observation Re-evaluation Note  Tracy Poole is a 25 y.o. female, seen on rounds today.  Pt initially presented to the ED for complaints of Psychiatric Evaluation Currently, the patient is resting, in NAD.  Physical Exam  BP 128/78 (BP Location: Left Arm)   Pulse (!) 114   Temp 98.2 F (36.8 C) (Oral)   Resp 20   Ht 5\' 4"  (1.626 m)   Wt 113.4 kg   LMP 04/06/2021   SpO2 100%   BMI 42.91 kg/m  Physical Exam General:NAD Cardiac:well perfused Lungs:even and unlabored Psych:calm  ED Course / MDM  EKG:   I have reviewed the labs performed to date as well as medications administered while in observation.  Recent changes in the last 24 hours include none.  Plan  Current plan is for psych disposition. Patient is under full IVC at this time.   06/06/2021, MD 04/07/21 [phone]

## 2021-04-07 NOTE — BH Assessment (Signed)
Patient has been accepted to [facility].  Patient assigned to [facility], [address] Accepting physician is Dr. Forrestine Him.  Call report to [phone].  Representative was [place].   ER Staff is aware of it:  Inetta Fermo, ER Desma Maxim, Patient's Nurse   Address: [address]  [place], [place] [address]

## 2021-04-07 NOTE — BH Assessment (Signed)
[facility] BMU currently at capacity  Referral information for Psychiatric Hospitalization faxed to;    [facility] (Currently at Capacity)  . [facility] ([phone]-or- [phone]),   . [facility] ([phone]),   . [facility] ([phone] -or- [phone]),   . [facility] ([phone]).     
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
## 2021-04-07 NOTE — ED Notes (Signed)
Report given to Morrie Sheldon, [place]. Pt escorted to [facility] by NA and security via wheelchair
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
## 2021-04-07 NOTE — ED Notes (Signed)
Patient  breakfast. Shower offered. Patient declined this morning , states will take a shower later today.

## 2021-11-05 ENCOUNTER — Other Ambulatory Visit: Payer: Self-pay

## 2021-11-05 ENCOUNTER — Emergency Department (EMERGENCY_DEPARTMENT_HOSPITAL)
Admission: EM | Admit: 2021-11-05 | Discharge: 2021-11-06 | Disposition: A | Discharge status: Other Acute Inpatient Hospital | Payer: No Typology Code available for payment source | Source: Home / Self Care | Attending: Emergency Medicine | Admitting: Emergency Medicine

## 2021-11-05 DIAGNOSIS — Z046 Encounter for general psychiatric examination, requested by authority: Secondary | ICD-10-CM | POA: Insufficient documentation

## 2021-11-05 DIAGNOSIS — J45909 Unspecified asthma, uncomplicated: Secondary | ICD-10-CM | POA: Insufficient documentation

## 2021-11-05 DIAGNOSIS — F989 Unspecified behavioral and emotional disorders with onset usually occurring in childhood and adolescence: Secondary | ICD-10-CM | POA: Insufficient documentation

## 2021-11-05 DIAGNOSIS — Y900 Blood alcohol level of less than 20 mg/100 ml: Secondary | ICD-10-CM | POA: Insufficient documentation

## 2021-11-05 DIAGNOSIS — F3163 Bipolar disorder, current episode mixed, severe, without psychotic features: Secondary | ICD-10-CM | POA: Insufficient documentation

## 2021-11-05 DIAGNOSIS — Z20822 Contact with and (suspected) exposure to covid-19: Secondary | ICD-10-CM | POA: Insufficient documentation

## 2021-11-05 DIAGNOSIS — Z87891 Personal history of nicotine dependence: Secondary | ICD-10-CM | POA: Insufficient documentation

## 2021-11-05 DIAGNOSIS — Z79899 Other long term (current) drug therapy: Secondary | ICD-10-CM | POA: Insufficient documentation

## 2021-11-05 DIAGNOSIS — R4689 Other symptoms and signs involving appearance and behavior: Secondary | ICD-10-CM

## 2021-11-05 LAB — COMPREHENSIVE METABOLIC PANEL
ALT: 33 U/L (ref 0–44)
AST: 24 U/L (ref 15–41)
Albumin: 4 g/dL (ref 3.5–5.0)
Alkaline Phosphatase: 87 U/L (ref 38–126)
Anion gap: 9 (ref 5–15)
BUN: 6 mg/dL (ref 6–20)
CO2: 22 mmol/L (ref 22–32)
Calcium: 8.9 mg/dL (ref 8.9–10.3)
Chloride: 103 mmol/L (ref 98–111)
Creatinine, Ser: 0.8 mg/dL (ref 0.44–1.00)
GFR, Estimated: >60 mL/min (ref >60)
Glucose, Bld: 106 mg/dL — ABNORMAL HIGH (ref 70–99)
Potassium: 3.3 mmol/L — ABNORMAL LOW (ref 3.5–5.1)
Sodium: 134 mmol/L — ABNORMAL LOW (ref 135–145)
Total Bilirubin: 0.9 mg/dL (ref 0.3–1.2)
Total Protein: 7.6 g/dL (ref 6.5–8.1)

## 2021-11-05 LAB — URINE DRUG SCREEN, QUALITATIVE (ARMC ONLY)
Amphetamines, Ur Screen: NOT DETECTED
Barbiturates, Ur Screen: NOT DETECTED
Benzodiazepine, Ur Scrn: NOT DETECTED
Cannabinoid 50 Ng, Ur ~~LOC~~: NOT DETECTED
Cocaine Metabolite,Ur ~~LOC~~: NOT DETECTED
MDMA (Ecstasy)Ur Screen: NOT DETECTED
Methadone Scn, Ur: NOT DETECTED
Opiate, Ur Screen: NOT DETECTED
Phencyclidine (PCP) Ur S: NOT DETECTED
Tricyclic, Ur Screen: NOT DETECTED

## 2021-11-05 LAB — CBC
HCT: 39.8 % (ref 36.0–46.0)
Hemoglobin: 13.7 g/dL (ref 12.0–15.0)
MCH: 29.1 pg (ref 26.0–34.0)
MCHC: 34.4 g/dL (ref 30.0–36.0)
MCV: 84.5 fL (ref 80.0–100.0)
Platelets: 227 10*3/uL (ref 150–400)
RBC: 4.71 MIL/uL (ref 3.87–5.11)
RDW: 13.9 % (ref 11.5–15.5)
WBC: 13.2 10*3/uL — ABNORMAL HIGH (ref 4.0–10.5)
nRBC: 0 % (ref 0.0–0.2)

## 2021-11-05 LAB — SALICYLATE LEVEL: Salicylate Lvl: <7 mg/dL — ABNORMAL LOW (ref 7.0–30.0)

## 2021-11-05 LAB — RESP PANEL BY RT-PCR (FLU A&B, COVID) ARPGX2
Influenza A by PCR: NEGATIVE
Influenza B by PCR: NEGATIVE
SARS Coronavirus 2 by RT PCR: NEGATIVE

## 2021-11-05 LAB — POC URINE PREG, ED: Preg Test, Ur: NEGATIVE

## 2021-11-05 LAB — ETHANOL: Alcohol, Ethyl (B): <10 mg/dL (ref <10)

## 2021-11-05 LAB — ACETAMINOPHEN LEVEL: Acetaminophen (Tylenol), Serum: <10 ug/mL — ABNORMAL LOW (ref 10–30)

## 2021-11-05 MED ORDER — ARIPIPRAZOLE 10 MG PO TABS
10.0000 mg | ORAL_TABLET | Freq: Every day | ORAL | Status: DC
  Administered 2021-11-05 – 2021-11-06 (×2): 10 mg via ORAL
  Filled 2021-11-05 (×2): qty 1

## 2021-11-05 MED ORDER — HALOPERIDOL 5 MG PO TABS
5.0000 mg | ORAL_TABLET | Freq: Two times a day (BID) | ORAL | Status: DC
  Administered 2021-11-05 – 2021-11-06 (×4): 5 mg via ORAL
  Filled 2021-11-05 (×4): qty 1

## 2021-11-05 MED ORDER — HYDROXYZINE HCL 25 MG PO TABS: 50.0000 mg | ORAL_TABLET | Freq: Three times a day (TID) | ORAL | Status: DC | PRN

## 2021-11-05 MED ORDER — TRAZODONE HCL 100 MG PO TABS: 50.0000 mg | ORAL_TABLET | Freq: Every evening | ORAL | Status: DC | PRN

## 2021-11-05 MED ORDER — BENZTROPINE MESYLATE 1 MG PO TABS
1.0000 mg | ORAL_TABLET | Freq: Every day | ORAL | Status: DC
  Administered 2021-11-05 – 2021-11-06 (×2): 1 mg via ORAL
  Filled 2021-11-05 (×2): qty 1

## 2021-11-05 MED ORDER — BENZTROPINE MESYLATE 1 MG PO TABS: 2.0000 mg | ORAL_TABLET | Freq: Two times a day (BID) | ORAL | Status: DC | PRN
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
## 2021-11-05 NOTE — ED Notes (Signed)
Snack and beverage given. 

## 2021-11-05 NOTE — ED Notes (Signed)
Dr. Marisa Severin speaking with pt.

## 2021-11-05 NOTE — ED Notes (Addendum)
Patient changed into hospital provided clothing by this RN and [place] NT. Patient's belonging's placed into labeled bags.   Patient's belonging's include: Pair pink slippers, black/multi-colored night gown, tan bag with clothing and cell phone.  Patient still wearing silver-colored lip stud; unable to remove at this time.
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
## 2021-11-05 NOTE — ED Notes (Signed)
Report to include Situation, Background, Assessment, and Recommendations received from Amy RN. Patient alert and oriented, warm and dry, in no acute distress. Patient denies SI, HI, AVH and pain. Patient made aware of Q15 minute rounds and security cameras for their safety. Patient instructed to come to me with needs or concerns.  

## 2021-11-05 NOTE — ED Notes (Signed)
Meal tray given 
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
## 2021-11-06 ENCOUNTER — Inpatient Hospital Stay
Admission: AD | Admit: 2021-11-06 | Discharge: 2021-11-10 | DRG: 885 | Disposition: A | Discharge status: Home / Self Care | Payer: No Typology Code available for payment source | Source: Intra-hospital | Attending: Psychiatry | Admitting: Psychiatry

## 2021-11-06 ENCOUNTER — Other Ambulatory Visit: Payer: Self-pay

## 2021-11-06 DIAGNOSIS — F3163 Bipolar disorder, current episode mixed, severe, without psychotic features: Principal | ICD-10-CM | POA: Diagnosis present

## 2021-11-06 DIAGNOSIS — Z87891 Personal history of nicotine dependence: Secondary | ICD-10-CM | POA: Diagnosis not present

## 2021-11-06 DIAGNOSIS — Z6841 Body Mass Index (BMI) 40.0 and over, adult: Secondary | ICD-10-CM

## 2021-11-06 DIAGNOSIS — Z20822 Contact with and (suspected) exposure to covid-19: Secondary | ICD-10-CM | POA: Diagnosis present

## 2021-11-06 DIAGNOSIS — Z62820 Parent-biological child conflict: Secondary | ICD-10-CM | POA: Diagnosis present

## 2021-11-06 DIAGNOSIS — Z91199 Patient's noncompliance with other medical treatment and regimen due to unspecified reason: Secondary | ICD-10-CM

## 2021-11-06 DIAGNOSIS — Z9114 Patient's other noncompliance with medication regimen: Secondary | ICD-10-CM

## 2021-11-06 MED ORDER — ARIPIPRAZOLE 10 MG PO TABS
10.0000 mg | ORAL_TABLET | Freq: Every day | ORAL | Status: DC
  Administered 2021-11-07: 10 mg via ORAL
  Filled 2021-11-06: qty 1

## 2021-11-06 MED ORDER — BENZTROPINE MESYLATE 1 MG PO TABS
1.0000 mg | ORAL_TABLET | Freq: Every day | ORAL | Status: DC
  Administered 2021-11-07 – 2021-11-10 (×4): 1 mg via ORAL
  Filled 2021-11-06 (×4): qty 1

## 2021-11-06 MED ORDER — ACETAMINOPHEN 325 MG PO TABS: 650.0000 mg | ORAL_TABLET | Freq: Four times a day (QID) | ORAL | Status: DC | PRN

## 2021-11-06 MED ORDER — HYDROXYZINE HCL 50 MG PO TABS: 50.0000 mg | ORAL_TABLET | Freq: Three times a day (TID) | ORAL | Status: DC | PRN

## 2021-11-06 MED ORDER — HALOPERIDOL 5 MG PO TABS
5.0000 mg | ORAL_TABLET | Freq: Two times a day (BID) | ORAL | Status: DC
  Administered 2021-11-07 – 2021-11-10 (×7): 5 mg via ORAL
  Filled 2021-11-06 (×7): qty 1

## 2021-11-06 MED ORDER — TRAZODONE HCL 50 MG PO TABS
50.0000 mg | ORAL_TABLET | Freq: Every evening | ORAL | Status: DC | PRN
  Administered 2021-11-07 – 2021-11-08 (×2): 50 mg via ORAL
  Filled 2021-11-06 (×2): qty 1

## 2021-11-06 MED ORDER — ALUM & MAG HYDROXIDE-SIMETH 200-200-20 MG/5ML PO SUSP: 30.0000 mL | ORAL | Status: DC | PRN

## 2021-11-06 MED ORDER — MAGNESIUM HYDROXIDE 400 MG/5ML PO SUSP: 30.0000 mL | Freq: Every day | ORAL | Status: DC | PRN

## 2021-11-06 NOTE — ED Provider Notes (Signed)
Emergency Medicine Observation Re-evaluation Note  Tracy Poole is a 25 y.o. female, seen on rounds today.  Pt initially presented to the ED for complaints of Aggressive Behavior Currently, the patient is resting comfortably.  Physical Exam  BP (!) 128/52 (BP Location: Left Arm)   Pulse 93   Temp 99 F (37.2 C) (Oral)   Resp 19   Ht 5\' 4"  (1.626 m)   Wt 113.4 kg   LMP 11/02/2021   SpO2 96%   BMI 42.91 kg/m  Physical Exam Constitutional:      Appearance: She is not ill-appearing or toxic-appearing.  HENT:     Head: Atraumatic.  Cardiovascular:     Comments: Appears well perfused Pulmonary:     Effort: Pulmonary effort is normal.  Abdominal:     General: There is no distension.  Musculoskeletal:        General: No deformity.  Neurological:     General: No focal deficit present.     ED Course / MDM  EKG:   I have reviewed the labs performed to date as well as medications administered while in observation.  Recent changes in the last 24 hours include moving patient back to our BHU, no other events.  Plan  Current plan is for placement. Tracy Poole is under involuntary commitment.      Vella Redhead, MD 11/06/21 [id number]

## 2021-11-06 NOTE — ED Notes (Signed)
Pt given dinner tray.

## 2021-11-06 NOTE — Plan of Care (Signed)
Patient new to the unit tonight, hasn't had time to progress  Problem: Education: Goal: Knowledge of [facility] Education information/materials will improve Outcome: Not Progressing Goal: Emotional status will improve Outcome: Not Progressing Goal: Mental status will improve Outcome: Not Progressing Goal: Verbalization of understanding the information provided will improve Outcome: Not Progressing   Problem: Safety: Goal: Periods of time without injury will increase Outcome: Not Progressing   Problem: Education: Goal: Utilization of techniques to improve thought processes will improve Outcome: Not Progressing Goal: Knowledge of the prescribed therapeutic regimen will improve Outcome: Not Progressing   Problem: Safety: Goal: Ability to disclose and discuss suicidal ideas will improve Outcome: Not Progressing Goal: Ability to identify and utilize support systems that promote safety will improve Outcome: Not Progressing   

## 2021-11-06 NOTE — ED Notes (Signed)
IVC/Pending Placement 

## 2021-11-06 NOTE — ED Notes (Signed)
Breakfast tray given. VS assessed. Shower offered. No other needs found.
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
## 2021-11-06 NOTE — ED Notes (Signed)
IVC/pending psych admission 

## 2021-11-06 NOTE — ED Notes (Signed)
VS not taken, patient asleep 

## 2021-11-06 NOTE — Tx Team (Signed)
Initial Treatment Plan 11/06/2021 11:14 PM [name] IPP:[id number]    PATIENT STRESSORS: Marital or family conflict   Medication change or noncompliance     PATIENT STRENGTHS: Motivation for treatment/growth  Supportive family/friends    PATIENT IDENTIFIED PROBLEMS: Medication noncompliance  Depression                   DISCHARGE CRITERIA:  Motivation to continue treatment in a less acute level of care Verbal commitment to aftercare and medication compliance  PRELIMINARY DISCHARGE PLAN: Outpatient therapy Return to previous living arrangement  PATIENT/FAMILY INVOLVEMENT: This treatment plan has been presented to and reviewed with the patient, Tracy Poole. The patient has been given the opportunity to ask questions and make suggestions.  Elmyra Ricks, RN 11/06/2021, 11:14 PM

## 2021-11-06 NOTE — BH Assessment (Signed)
PATIENT BED AT 10PM ON 11/06/21  Patient is to be admitted to [facility] by Psychiatric Nurse Practitioner Gillermo Murdoch.  Attending Physician will be Dr.  Toni Amend .   Patient has been assigned to room 302, by [facility] Charge Nurse [place].    ER staff is aware of the admission: [facility] ER Secretary   Dr. Larinda Buttery, ER MD  Florentina Addison Patient's Nurse  Rosey Bath Patient Access.

## 2021-11-06 NOTE — ED Notes (Signed)
Lunch tray given @1215 

## 2021-11-06 NOTE — BH Assessment (Signed)
Patient under review at [facility]. Awaiting accepting details.  °

## 2021-11-07 DIAGNOSIS — F3163 Bipolar disorder, current episode mixed, severe, without psychotic features: Principal | ICD-10-CM

## 2021-11-07 MED ORDER — ARIPIPRAZOLE 10 MG PO TABS
20.0000 mg | ORAL_TABLET | Freq: Every day | ORAL | Status: DC
  Administered 2021-11-08 – 2021-11-10 (×3): 20 mg via ORAL
  Filled 2021-11-07 (×3): qty 2

## 2021-11-07 MED ORDER — ARIPIPRAZOLE ER 400 MG IM SRER
400.0000 mg | INTRAMUSCULAR | Status: DC
  Administered 2021-11-07: 400 mg via INTRAMUSCULAR
  Filled 2021-11-07: qty 2
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
## 2021-11-07 NOTE — BHH Suicide Risk Assessment (Signed)
B[facility] INPATIENT:  Family/Significant Other Suicide Prevention Education  Suicide Prevention Education:  Patient Refusal for Family/Significant Other Suicide Prevention Education: The patient Tracy Poole has refused to provide written consent for family/significant other to be provided Family/Significant Other Suicide Prevention Education during admission and/or prior to discharge.  Physician notified.  SPE completed with patient.   Corky Crafts 11/07/2021, 2:04 PM

## 2021-11-07 NOTE — Progress Notes (Signed)
Patient pleasant and cooperative. Medication compliant. Appropriate with staff and peers. Patient asking when she will be discharged, reports she took her injection today. Denies any SI, HI, AVH. Noted smiling on unit, talking on telephone. Requested prn for sleep tonight. No other concerns voiced. Encouragement and support provided. Safety checks maintained. Medications given as prescribed. Pt receptive and remains safe on unit with q 15 min checks.

## 2021-11-07 NOTE — Progress Notes (Signed)
Tracy Poole, patient, provided written consent for CSW team to coordinate aftercare at this time. CSW to have further conversations with patient regarding care in the community.   Patient is seen at [facility]) for medication management.   Signed:  Corky Crafts, MSW, [place], LCASA 11/07/2021 2:05 PM

## 2021-11-07 NOTE — Progress Notes (Signed)
Recreation Therapy Notes  INPATIENT RECREATION THERAPY ASSESSMENT  Patient Details Name: Tracy Poole MRN: [id number] DOB: 24-Dec-1995 Today's Date: 11/07/2021       Information Obtained From: Patient  Able to Participate in Assessment/Interview: Yes  Patient Presentation: Responsive  Reason for Admission (Per Patient): Active Symptoms, Aggressive/Threatening  Patient Stressors: Family  Coping Skills:   Isolation, Arguments, Aggression, Exercise, Deep Breathing, Avoidance  Leisure Interests (2+):  Individual - TV, Individual - Phone, Music - Listen  Frequency of Recreation/Participation: Weekly  Awareness of Community Resources:  No  Community Resources:     Current Use:    If no, Barriers?:    Expressed Interest in [employer] Information: No  [place]  Patient Main Form of Transportation: Set designer  Patient Strengths:  I don't do anything wrong  Patient Identified Areas of Improvement:  Staying to myself  Patient Goal for Hospitalization:  Getting out  Current SI (including self-harm):  No  Current HI:  No  Current AVH: No  Staff Intervention Plan: Group Attendance, Collaborate with Interdisciplinary Treatment Team  Consent to Intern Participation: N/A  Arthuro Canelo 11/07/2021, 12:15 PM

## 2021-11-07 NOTE — Progress Notes (Signed)
Recreation Therapy Notes  INPATIENT RECREATION TR PLAN  Patient Details Name: Tracy Poole MRN: [id number] DOB: March 14, 1996 Today's Date: 11/07/2021  Rec Therapy Plan Is patient appropriate for Therapeutic Recreation?: Yes Treatment times per week: at least 3 Estimated Length of Stay: 5-7 days TR Treatment/Interventions: Group participation (Comment)  Discharge Criteria Pt will be discharged from therapy if:: Discharged Treatment plan/goals/alternatives discussed and agreed upon by:: Patient/family  Discharge Summary     Taos Tapp 11/07/2021, 12:16 PM

## 2021-11-07 NOTE — [facility] Suicide Risk Assessment (Signed)
[facility] Admission Suicide Risk Assessment   Nursing information obtained from:  Patient Demographic factors:  Low socioeconomic status, Unemployed Current Mental Status:  NA Loss Factors:  Financial problems / change in socioeconomic status, Legal issues Historical Factors:  Impulsivity Risk Reduction Factors:  Responsible for children under 25 years of age  Total Time spent with patient: 1 hour Principal Problem: Bipolar affective disorder, mixed, severe (HCC) Diagnosis:  Principal Problem:   Bipolar affective disorder, mixed, severe (HCC) Active Problems:   Noncompliance with medications   Morbid obesity with BMI of 40.0-44.9, adult (HCC)  Subjective Data: Patient seen and chart reviewed.  25 year old woman with a history of schizoaffective or bipolar disorder comes to the hospital after becoming agitated and aggressive at home.  She has been cooperative and calm in the emergency room and here on the psychiatric ward.  In conversation she denies suicidal or homicidal ideation.  States that she wants to get back on her medicine.  Continued Clinical Symptoms:  Alcohol Use Disorder Identification Test Final Score (AUDIT): 0 The "Alcohol Use Disorders Identification Test", Guidelines for Use in Primary Care, Second Edition.  World Science writer [facility]). Score between 0-7:  no or low risk or alcohol related problems. Score between 8-15:  moderate risk of alcohol related problems. Score between 16-19:  high risk of alcohol related problems. Score 20 or above:  warrants further diagnostic evaluation for alcohol dependence and treatment.   CLINICAL FACTORS:   Bipolar Disorder:   Mixed State   Musculoskeletal: Strength & Muscle Tone: within normal limits Gait & Station: normal Patient leans: N/A  Psychiatric Specialty Exam:  Presentation  General Appearance: Appropriate for Environment  Eye Contact:Minimal  Speech:Clear and Coherent  Speech  Volume:Normal  Handedness:Right   Mood and Affect  Mood:Anxious; Irritable  Affect:Blunt; Congruent; Inappropriate   Thought Process  Thought Processes:Disorganized  Descriptions of Associations:Loose  Orientation:Full (Time, Place and Person)  Thought Content:No data recorded History of Schizophrenia/Schizoaffective disorder:Yes  Duration of Psychotic Symptoms:Greater than six months  Hallucinations:No data recorded Ideas of Reference:Paranoia  Suicidal Thoughts:No data recorded Homicidal Thoughts:No data recorded  Sensorium  Memory:Immediate Fair; Recent Fair; Remote Fair  Judgment:Poor  Insight:Poor   Executive Functions  Concentration:Fair  Attention Span:Fair  Recall:Fair  Fund of Knowledge:Fair  Language:Fair   Psychomotor Activity  Psychomotor Activity:No data recorded  Assets  Assets:Communication Skills; Desire for Improvement; Resilience; Social Support   Sleep  Sleep:No data recorded   Physical Exam: Physical Exam Vitals and nursing note reviewed.  Constitutional:      Appearance: Normal appearance.  HENT:     Head: Normocephalic and atraumatic.     Mouth/Throat:     Pharynx: Oropharynx is clear.  Eyes:     Pupils: Pupils are equal, round, and reactive to light.  Cardiovascular:     Rate and Rhythm: Normal rate and regular rhythm.  Pulmonary:     Effort: Pulmonary effort is normal.     Breath sounds: Normal breath sounds.  Abdominal:     General: Abdomen is flat.     Palpations: Abdomen is soft.  Musculoskeletal:        General: Normal range of motion.  Skin:    General: Skin is warm and dry.  Neurological:     General: No focal deficit present.     Mental Status: She is alert. Mental status is at baseline.  Psychiatric:        Attention and Perception: She is inattentive.  Mood and Affect: Mood normal. Affect is blunt.        Speech: Speech is delayed.        Behavior: Behavior is withdrawn.        Thought  Content: Thought content normal.        Cognition and Memory: Cognition is impaired. Memory is impaired.        Judgment: Judgment is impulsive.   Review of Systems  Constitutional: Negative.   HENT: Negative.    Eyes: Negative.   Respiratory: Negative.    Cardiovascular: Negative.   Gastrointestinal: Negative.   Musculoskeletal: Negative.   Skin: Negative.   Neurological: Negative.   Psychiatric/Behavioral:  Positive for depression and memory loss. Negative for hallucinations, substance abuse and suicidal ideas. The patient is not nervous/anxious and does not have insomnia.   Blood pressure 110/70, pulse 87, temperature 98.5 F (36.9 C), temperature source Oral, resp. rate 18, height 5\' 4"  (1.626 m), weight 131.1 kg, last menstrual period 11/02/2021, SpO2 97 %. Body mass index is 49.61 kg/m.   COGNITIVE FEATURES THAT CONTRIBUTE TO RISK:  Thought constriction (tunnel vision)    SUICIDE RISK:   Minimal: No identifiable suicidal ideation.  Patients presenting with no risk factors but with morbid ruminations; may be classified as minimal risk based on the severity of the depressive symptoms  PLAN OF CARE: Restart psychiatric medicine.  15-minute checks.  Engage in individual and group therapy.  Reassess suicidality prior to discharge  I certify that inpatient services furnished can reasonably be expected to improve the patient's condition.   14/06/2021, MD 11/07/2021, 3:49 PM

## 2021-11-07 NOTE — Plan of Care (Signed)
  Problem: Education: Goal: Knowledge of [facility] Education information/materials will improve Outcome: Progressing Goal: Emotional status will improve Outcome: Progressing Goal: Mental status will improve Outcome: Progressing Goal: Verbalization of understanding the information provided will improve Outcome: Progressing   Problem: Safety: Goal: Periods of time without injury will increase Outcome: Progressing   Problem: Education: Goal: Knowledge of the prescribed therapeutic regimen will improve Outcome: Progressing   Problem: Safety: Goal: Ability to disclose and discuss suicidal ideas will improve Outcome: Progressing   

## 2021-11-07 NOTE — Progress Notes (Signed)
D: Pt alert and oriented. Pt rates depression 0/10, hopelessness 0/10, and anxiety 0/10. Pt reports energy level as normal and concentration as being good. Pt reports sleep last night as being good. Pt did not receive medications for sleep. Pt denies experiencing any pain at this time. Pt denies experiencing any SI/HI, or AVH at this time.   Pt did not get up for medication pass or group, however pt has been calm and cooperative in taking medications.Pt receive Abilify injection this afternoon.   A: Scheduled medications administered to pt, per MD orders. Support and encouragement provided. Frequent verbal contact made. Routine safety checks conducted q15 minutes.   R: No adverse drug reactions noted. Pt verbally contracts for safety at this time. Pt complaint with medications. Pt interacts minimally with others on the unit. Pt remains safe at this time. Will continue to monitor.

## 2021-11-07 NOTE — Progress Notes (Signed)
Recreation Therapy Notes   Date: 11/07/2021  Time: 10:15 am   Location:  Craft room    Behavioral response: N/A   Intervention Topic: Self-care      Discussion/Intervention: Patient did not attend group.  Clinical Observations/Feedback:  Patient did not attend group.   Jaykwon Morones LRT/CTRS        Daveena Elmore 11/07/2021 12:01 PM

## 2021-11-07 NOTE — Group Note (Signed)
[facility] LCSW Group Therapy Note ° ° °Group Date: 11/07/2021 °Start Time: 1300 °End Time: 1400 ° °Type of Therapy/Topic:  Group Therapy:  Feelings about Diagnosis ° °Participation Level:  Did Not Attend  ° °Mood: n/a  ° ° °Description of Group:   ° This group will allow patients to explore their thoughts and feelings about diagnoses they have received. Patients will be guided to explore their level of understanding and acceptance of these diagnoses. Facilitator will encourage patients to process their thoughts and feelings about the reactions of others to their diagnosis, and will guide patients in identifying ways to discuss their diagnosis with significant others in their lives. This group will be process-oriented, with patients participating in exploration of their own experiences as well as giving and receiving support and challenge from other group members. ° ° °Therapeutic Goals: °1. Patient will demonstrate understanding of diagnosis as evidence by identifying two or more symptoms of the disorder:  °2. Patient will be able to express two feelings regarding the diagnosis °3. Patient will demonstrate ability to communicate their needs through discussion and/or role plays ° °Summary of Patient Progress: °Patient did not attend group despite encouraged participation.   ° ° ° °Therapeutic Modalities:   °Cognitive Behavioral Therapy °Brief Therapy °Feelings Identification  ° ° °Jalaine Riggenbach W Andersson [place], LCSWA °

## 2021-11-08 LAB — LIPID PANEL
Cholesterol: 176 mg/dL (ref 0–200)
HDL: 30 mg/dL — ABNORMAL LOW (ref >40)
LDL Cholesterol: 134 mg/dL — ABNORMAL HIGH (ref 0–99)
Total CHOL/HDL Ratio: 5.9 RATIO
Triglycerides: 59 mg/dL (ref <150)
VLDL: 12 mg/dL (ref 0–40)

## 2021-11-08 NOTE — Progress Notes (Signed)
Patient calm and compliant during assessment denying SI/HI/AVH. Patient stated to this writer that she feels ready to go, Pt given education and support. Pt observed interacting appropriately with staff and peers on the unit. Pt given education, support, and encouragement to be active in her treatment plan. Pt being monitored Q 15 minutes for safety per unit protocol. Pt remains safe on the unit.

## 2021-11-08 NOTE — Group Note (Signed)
[facility] LCSW Group Therapy Note ° ° °Group Date: 11/08/2021 °Start Time: 1300 °End Time: 1400 ° ° °Type of Therapy/Topic:  Group Therapy:  Emotion Regulation ° °Participation Level:  Did Not Attend  ° °Mood: ° °Description of Group:   ° The purpose of this group is to assist patients in learning to regulate negative emotions and experience positive emotions. Patients will be guided to discuss ways in which they have been vulnerable to their negative emotions. These vulnerabilities will be juxtaposed with experiences of positive emotions or situations, and patients challenged to use positive emotions to combat negative ones. Special emphasis will be placed on coping with negative emotions in conflict situations, and patients will process healthy conflict resolution skills. ° °Therapeutic Goals: °Patient will identify two positive emotions or experiences to reflect on in order to balance out negative emotions:  °Patient will label two or more emotions that they find the most difficult to experience:  °Patient will be able to demonstrate positive conflict resolution skills through discussion or role plays:  ° °Summary of Patient Progress: ° ° °X ° ° ° °Therapeutic Modalities:   °Cognitive Behavioral Therapy °Feelings Identification °Dialectical Behavioral Therapy ° ° °Dejanique Ruehl J Jenner Rosier, LCSW °

## 2021-11-08 NOTE — BH IP Treatment Plan (Signed)
Interdisciplinary Treatment and Diagnostic Plan Update  11/08/2021 Time of Session: 0900 Kollins Fenter MRN: [id number]  Principal Diagnosis: Bipolar affective disorder, mixed, severe ([place])  Secondary Diagnoses: Principal Problem:   Bipolar affective disorder, mixed, severe ([place]) Active Problems:   Noncompliance with medications   Morbid obesity with BMI of 40.0-44.9, adult (HCC)   Current Medications:  Current Facility-Administered Medications  Medication Dose Route Frequency Provider Last Rate Last Admin   acetaminophen (TYLENOL) tablet 650 mg  650 mg Oral Q6H PRN Caroline Sauger, NP       alum & mag hydroxide-simeth (MAALOX/MYLANTA) 200-200-20 MG/5ML suspension 30 mL  30 mL Oral Q4H PRN Caroline Sauger, NP       ARIPiprazole (ABILIFY) tablet 20 mg  20 mg Oral Daily Clapacs, [name], MD   20 mg at 11/08/21 0736   ARIPiprazole ER (ABILIFY MAINTENA) injection 400 mg  400 mg Intramuscular Q28 days Clapacs, John T, MD   400 mg at 11/07/21 1359   benztropine (COGENTIN) tablet 1 mg  1 mg Oral Daily Caroline Sauger, NP   1 mg at 11/08/21 0736   haloperidol (HALDOL) tablet 5 mg  5 mg Oral BID Caroline Sauger, NP   5 mg at 11/08/21 0736   hydrOXYzine (ATARAX) tablet 50 mg  50 mg Oral TID PRN Caroline Sauger, NP       magnesium hydroxide (MILK OF MAGNESIA) suspension 30 mL  30 mL Oral Daily PRN Caroline Sauger, NP       traZODone (DESYREL) tablet 50 mg  50 mg Oral QHS PRN Caroline Sauger, NP   50 mg at 11/07/21 2056   PTA Medications: Medications Prior to Admission  Medication Sig Dispense Refill Last Dose   ARIPiprazole (ABILIFY) 10 MG tablet Take 1 tablet (10 mg total) by mouth daily. (Patient not taking: Reported on 11/05/2021) 12 tablet 0    ARIPiprazole ER (ABILIFY MAINTENA) 400 MG SRER injection Inject 2 mLs (400 mg total) into the muscle every 28 (twenty-eight) days. (Patient not taking: Reported on 11/05/2021) 1 each 1    benztropine (COGENTIN) 2  MG tablet Take 1 tablet (2 mg total) by mouth 2 (two) times daily as needed for tremors (spitting). (Patient not taking: Reported on 11/05/2021) 60 tablet 1    haloperidol (HALDOL) 5 MG tablet Take 1 tablet (5 mg total) by mouth 2 (two) times daily. (Patient not taking: Reported on 11/05/2021) 60 tablet 1    hydrOXYzine (ATARAX/VISTARIL) 50 MG tablet Take 1 tablet (50 mg total) by mouth 3 (three) times daily as needed for anxiety. (Patient not taking: Reported on 11/05/2021) 90 tablet 1    traZODone (DESYREL) 50 MG tablet Take 1 tablet (50 mg total) by mouth at bedtime as needed for sleep. (Patient not taking: Reported on 11/05/2021) 30 tablet 1     Patient Stressors: Marital or family conflict   Medication change or noncompliance    Patient Strengths: Motivation for treatment/growth  Supportive family/friends   Treatment Modalities: Medication Management, Group therapy, Case management,  1 to 1 session with clinician, Psychoeducation, Recreational therapy.   Physician Treatment Plan for Primary Diagnosis: Bipolar affective disorder, mixed, severe ([place]) Long Term Goal(s): Improvement in symptoms so as ready for discharge   Short Term Goals: Ability to maintain clinical measurements within normal limits will improve Compliance with prescribed medications will improve Ability to demonstrate self-control will improve Ability to identify and develop effective coping behaviors will improve  Medication Management: Evaluate patient's response, side effects, and tolerance of medication  regimen.  Therapeutic Interventions: 1 to 1 sessions, Unit Group sessions and Medication administration.  Evaluation of Outcomes: Not Met  Physician Treatment Plan for Secondary Diagnosis: Principal Problem:   Bipolar affective disorder, mixed, severe ([place]) Active Problems:   Noncompliance with medications   Morbid obesity with BMI of 40.0-44.9, adult ([place])  Long Term Goal(s): Improvement in symptoms so as  ready for discharge   Short Term Goals: Ability to maintain clinical measurements within normal limits will improve Compliance with prescribed medications will improve Ability to demonstrate self-control will improve Ability to identify and develop effective coping behaviors will improve     Medication Management: Evaluate patient's response, side effects, and tolerance of medication regimen.  Therapeutic Interventions: 1 to 1 sessions, Unit Group sessions and Medication administration.  Evaluation of Outcomes: Not Met   RN Treatment Plan for Primary Diagnosis: Bipolar affective disorder, mixed, severe ([place]) Long Term Goal(s): Knowledge of disease and therapeutic regimen to maintain health will improve  Short Term Goals: Ability to remain free from injury will improve, Ability to verbalize frustration and anger appropriately will improve, Ability to demonstrate self-control, Ability to participate in decision making will improve, Ability to verbalize feelings will improve, Ability to disclose and discuss suicidal ideas, Ability to identify and develop effective coping behaviors will improve, and Compliance with prescribed medications will improve  Medication Management: RN will administer medications as ordered by provider, will assess and evaluate patient's response and provide education to patient for prescribed medication. RN will report any adverse and/or side effects to prescribing provider.  Therapeutic Interventions: 1 on 1 counseling sessions, Psychoeducation, Medication administration, Evaluate responses to treatment, Monitor vital signs and CBGs as ordered, Perform/monitor CIWA, COWS, AIMS and Fall Risk screenings as ordered, Perform wound care treatments as ordered.  Evaluation of Outcomes: Not Met   LCSW Treatment Plan for Primary Diagnosis: Bipolar affective disorder, mixed, severe ([place]) Long Term Goal(s): Safe transition to appropriate next level of care at discharge, Engage  patient in therapeutic group addressing interpersonal concerns.  Short Term Goals: Engage patient in aftercare planning with referrals and resources, Increase social support, Increase ability to appropriately verbalize feelings, Increase emotional regulation, Facilitate acceptance of mental health diagnosis and concerns, Facilitate patient progression through stages of change regarding substance use diagnoses and concerns, Identify triggers associated with mental health/substance abuse issues, and Increase skills for wellness and recovery  Therapeutic Interventions: Assess for all discharge needs, 1 to 1 time with Social worker, Explore available resources and support systems, Assess for adequacy in community support network, Educate family and significant other(s) on suicide prevention, Complete Psychosocial Assessment, Interpersonal group therapy.  Evaluation of Outcomes: Not Met   Progress in Treatment: Attending groups: No. Participating in groups: No. Taking medication as prescribed: Yes. Toleration medication: Yes. Family/Significant other contact made: No, will contact:  Patient declined to provided consent for CSW to reach collateral contact.  Patient understands diagnosis: No. Discussing patient identified problems/goals with staff: Yes. Medical problems stabilized or resolved: Yes. Denies suicidal/homicidal ideation: Yes. Issues/concerns per patient self-inventory: Yes. Other: none  New problem(s) identified: No, Describe:  No additional problems identified at this time.   New Short Term/Long Term Goal(s): Patient to work towards  elimination of symptoms of psychosis, medication management for mood stabilization; elimination of SI thoughts; development of comprehensive mental wellness plan.  Patient Goals:  "get out . . . Just get back to the right meds."   Discharge Plan or Barriers: No barriers identified at this time.   Reason  for Continuation of Hospitalization:  Aggression  Estimated Length of Stay: 1-7 days   Scribe for Treatment Team: Larose Kells 11/08/2021 9:57 AM

## 2021-11-08 NOTE — Progress Notes (Signed)
Recreation Therapy Notes   Date: 11/08/2021  Time: 10:15am   Location:  Craft room    Behavioral response: N/A   Intervention Topic: Values   Discussion/Intervention: Patient did not attend group.  Clinical Observations/Feedback:  Patient did not attend group.   Tyianna Menefee LRT/CTRS          Manjot Hinks 11/08/2021 11:11 AM

## 2021-11-08 NOTE — Progress Notes (Signed)
[facility] MD Progress Note  11/08/2021 11:20 AM Tracy Poole  MRN:  [id number] Subjective: Patient attended treatment team today and I met with her individually.  Patient has few complaints and only wanted to talk about being discharged.  Mostly staying in bed today.  Will not say much about her situation outside the hospital except that she wants to be on "the right meds".  I asked her what that would mean and she said the ones that made her feel like herself.  I asked her if she was feeling more like herself and she said yes but again did not elaborate.  Patient was given her long-acting Abilify yesterday and has been compliant with medicine. Principal Problem: Bipolar affective disorder, mixed, severe ([place]) Diagnosis: Principal Problem:   Bipolar affective disorder, mixed, severe ([place]) Active Problems:   Noncompliance with medications   Morbid obesity with BMI of 40.0-44.9, adult ([place])  Total Time spent with patient: 30 minutes  Past Psychiatric History: Past history of bipolar disorder with agitation.  History of noncompliance  Past Medical History:  Past Medical History:  Diagnosis Date   Anxiety    Asthma    Depression    Schizophrenia ([place])     Past Surgical History:  Procedure Laterality Date   CESAREAN SECTION     EUSTACHIAN TUBE DILATION     HERNIA REPAIR     Family History:  Family History  Problem Relation Age of Onset   Bladder Cancer Neg Hx    Kidney cancer Neg Hx    Family Psychiatric  History: See previous Social History:  Social History   Substance and Sexual Activity  Alcohol Use Yes   Comment: occasionallly     Social History   Substance and Sexual Activity  Drug Use No    Social History   Socioeconomic History   Marital status: Single    Spouse name: Not on file   Number of children: Not on file   Years of education: Not on file   Highest education level: Not on file  Occupational History   Not on file  Tobacco Use   Smoking status:  Former    Packs/day: 0.25    Types: Cigarettes   Smokeless tobacco: Never  Vaping Use   Vaping Use: Never used  Substance and Sexual Activity   Alcohol use: Yes    Comment: occasionallly   Drug use: No   Sexual activity: Yes    Birth control/protection: None  Other Topics Concern   Not on file  Social History Narrative   Not on file   Social Determinants of Health   Financial Resource Strain: Not on file  Food Insecurity: Not on file  Transportation Needs: Not on file  Physical Activity: Not on file  Stress: Not on file  Social Connections: Not on file   Additional Social History:                         Sleep: Good  Appetite:  Fair  Current Medications: Current Facility-Administered Medications  Medication Dose Route Frequency Provider Last Rate Last Admin   acetaminophen (TYLENOL) tablet 650 mg  650 mg Oral Q6H PRN Caroline Sauger, NP       alum & mag hydroxide-simeth (MAALOX/MYLANTA) 200-200-20 MG/5ML suspension 30 mL  30 mL Oral Q4H PRN Caroline Sauger, NP       ARIPiprazole (ABILIFY) tablet 20 mg  20 mg Oral Daily Dedria Endres, [name], MD   20  mg at 11/08/21 0736   ARIPiprazole ER (ABILIFY MAINTENA) injection 400 mg  400 mg Intramuscular Q28 days Cathyann Kilfoyle, [name], MD   400 mg at 11/07/21 1359   benztropine (COGENTIN) tablet 1 mg  1 mg Oral Daily Caroline Sauger, NP   1 mg at 11/08/21 0736   haloperidol (HALDOL) tablet 5 mg  5 mg Oral BID Caroline Sauger, NP   5 mg at 11/08/21 5366   hydrOXYzine (ATARAX) tablet 50 mg  50 mg Oral TID PRN Caroline Sauger, NP       magnesium hydroxide (MILK OF MAGNESIA) suspension 30 mL  30 mL Oral Daily PRN Caroline Sauger, NP       traZODone (DESYREL) tablet 50 mg  50 mg Oral QHS PRN Caroline Sauger, NP   50 mg at 11/07/21 2056    Lab Results:  Results for orders placed or performed during the hospital encounter of 11/06/21 (from the past 48 hour(s))  Lipid panel     Status: Abnormal    Collection Time: 11/08/21  7:35 AM  Result Value Ref Range   Cholesterol 176 0 - 200 mg/dL   Triglycerides 59 <150 mg/dL   HDL 30 (L) >40 mg/dL   Total CHOL/HDL Ratio 5.9 RATIO   VLDL 12 0 - 40 mg/dL   LDL Cholesterol 134 (H) 0 - 99 mg/dL    Comment:        Total Cholesterol/HDL:CHD Risk Coronary Heart Disease Risk Table                     Men   Women  1/2 Average Risk   3.4   3.3  Average Risk       5.0   4.4  2 X Average Risk   9.6   7.1  3 X Average Risk  23.4   11.0        Use the calculated Patient Ratio above and the CHD Risk Table to determine the patient's CHD Risk.        ATP III CLASSIFICATION (LDL):  <100     mg/dL   Optimal  100-129  mg/dL   Near or Above                    Optimal  130-159  mg/dL   Borderline  160-189  mg/dL   High  >190     mg/dL   Very High Performed at [facility], [address] [place], [place] [address]     Blood Alcohol level:  Lab Results  Component Value Date   [facility] <10 11/05/2021   ETH <10 74/25/9563    Metabolic Disorder Labs: Lab Results  Component Value Date   HGBA1C 5.4 11/01/2020   MPG 108.28 11/01/2020   MPG 99.67 11/28/2019   Lab Results  Component Value Date   PROLACTIN 53.4 (H) 03/28/2017   Lab Results  Component Value Date   CHOL 176 11/08/2021   TRIG 59 11/08/2021   HDL 30 (L) 11/08/2021   CHOLHDL 5.9 11/08/2021   VLDL 12 11/08/2021   LDLCALC 134 (H) 11/08/2021   LDLCALC 122 (H) 11/01/2020    Physical Findings: AIMS:  , ,  ,  ,    CIWA:    COWS:     Musculoskeletal: Strength & Muscle Tone: within normal limits Gait & Station: normal Patient leans: N/A  Psychiatric Specialty Exam:  Presentation  General Appearance: Appropriate for Environment  Eye Contact:Minimal  Speech:Clear and Coherent  Speech Volume:Normal  Handedness:Right   Mood and Affect  Mood:Anxious; Irritable  Affect:Blunt; Congruent; Inappropriate   Thought Process  Thought  Processes:Disorganized  Descriptions of Associations:Loose  Orientation:Full (Time, Place and Person)  Thought Content:No data recorded History of Schizophrenia/Schizoaffective disorder:Yes  Duration of Psychotic Symptoms:Greater than six months  Hallucinations:No data recorded Ideas of Reference:Paranoia  Suicidal Thoughts:No data recorded Homicidal Thoughts:No data recorded  Sensorium  Memory:Immediate Fair; Recent Fair; Remote Fair  Judgment:Poor  Insight:Poor   Executive Functions  Concentration:Fair  Attention Span:Fair  [place]   Psychomotor Activity  Psychomotor Activity:No data recorded  Assets  Assets:Communication Skills; Desire for Improvement; Resilience; Social Support   Sleep  Sleep:No data recorded   Physical Exam: Physical Exam Vitals and nursing note reviewed.  Constitutional:      Appearance: Normal appearance.  HENT:     Head: Normocephalic and atraumatic.     Mouth/Throat:     Pharynx: Oropharynx is clear.  Eyes:     Pupils: Pupils are equal, round, and reactive to light.  Cardiovascular:     Rate and Rhythm: Normal rate and regular rhythm.  Pulmonary:     Effort: Pulmonary effort is normal.     Breath sounds: Normal breath sounds.  Abdominal:     General: Abdomen is flat.     Palpations: Abdomen is soft.  Musculoskeletal:        General: Normal range of motion.  Skin:    General: Skin is warm and dry.  Neurological:     General: No focal deficit present.     Mental Status: She is alert. Mental status is at baseline.  Psychiatric:        Attention and Perception: She is inattentive.        Mood and Affect: Mood normal. Affect is blunt.        Speech: Speech is delayed.        Behavior: Behavior is withdrawn.        Thought Content: Thought content normal.        Cognition and Memory: Cognition is impaired.   Review of Systems  Constitutional: Negative.   HENT: Negative.     Eyes: Negative.   Respiratory: Negative.    Cardiovascular: Negative.   Gastrointestinal: Negative.   Musculoskeletal: Negative.   Skin: Negative.   Neurological: Negative.   Psychiatric/Behavioral: Negative.    Blood pressure 113/73, pulse 90, temperature 98.3 F (36.8 C), temperature source Oral, resp. rate 18, height _0  (1.626 m), weight 131.1 kg, last menstrual period 11/02/2021, SpO2 99 %. Body mass index is 49.61 kg/m.   Treatment Plan Summary: Medication management and Plan patient was asking for discharge today but after just 1 day I do not think she is ready to go home.  Still blunted and negative but also still pretty noncooperative.  Encourage patient to try and be up out of bed and be more interactive.  No change to medication orders for today.  Case reviewed with treatment team.  [facility] is aware of her and is going to really make an effort to make sure she follows up after discharge  Alethia Berthold, MD 11/08/2021, 11:20 AM

## 2021-11-08 NOTE — Progress Notes (Signed)
D: Pt alert and oriented. Pt rates depression 0/10, hopelessness 0/10, and anxiety 0/10. Pt goal: "Do what I need to." Pt reports energy level as normal and concentration as being good. Pt reports sleep last night as being good. Pt did receive medications for sleep. Pt denies experiencing any pain at this time. Pt denies experiencing any SI/HI, or AVH at this time.   During treatment team pt voices her goal was to get back on her medications and discharge. Pt states she wants to discharge today.  A: Scheduled medications administered to pt, per MD orders. Support and encouragement provided. Frequent verbal contact made. Routine safety checks conducted q15 minutes.   R: No adverse drug reactions noted. Pt verbally contracts for safety at this time. Pt complaint with medications. Pt interacts minimally with others on the unit, mostly self isolating to room with exception of meals. Pt remains safe at this time. Will continue to monitor.

## 2021-11-09 NOTE — Progress Notes (Signed)
Recreation Therapy Notes  Date: 11/09/2021  Time: 10:40 am      Location: Craft room    Behavioral response: N/A   Intervention Topic: Stress Management    Discussion/Intervention: Patient did not attend group.   Clinical Observations/Feedback:  Patient did not attend group.    Ronin Rehfeldt LRT/CTRS        Aiyla Baucom 11/09/2021 11:59 AM

## 2021-11-09 NOTE — Progress Notes (Signed)
Patient alert and oriented this shift. Denies SI/HI/AVH, anxiety, depression and pain.   Takes medications as prescribed. No adverse affects noted. Visible on the milieu some but isolates most of the day.   No adverse effects noted. Cont Q15 minute check for safety.

## 2021-11-09 NOTE — Progress Notes (Signed)
[facility] MD Progress Note  11/09/2021 3:12 PM Tracy Poole  MRN:  [id number] Subjective: Follow-up for this 24 year old woman with bipolar disorder mixed.  Patient has no new complaints today.  She says that she is feeling fine and wants to go home.  Denies feeling depressed.  Denies feeling angry.  Denies any wish to harm anyone.  States that she intends to be compliant with medication.  Has not shown any dangerous behavior in the hospital. Principal Problem: Bipolar affective disorder, mixed, severe ([facility]) Diagnosis: Principal Problem:   Bipolar affective disorder, mixed, severe ([facility]) Active Problems:   Noncompliance with medications   Morbid obesity with BMI of 40.0-44.9, adult ([facility])  Total Time spent with patient: 30 minutes  Past Psychiatric History: Past history of bipolar disorder with mixed episodes off and  Past Medical History:  Past Medical History:  Diagnosis Date   Anxiety    Asthma    Depression    Schizophrenia ([facility])     Past Surgical History:  Procedure Laterality Date   CESAREAN SECTION     EUSTACHIAN TUBE DILATION     HERNIA REPAIR     Family History:  Family History  Problem Relation Age of Onset   Bladder Cancer Neg Hx    Kidney cancer Neg Hx    Family Psychiatric  History: See previous Social History:  Social History   Substance and Sexual Activity  Alcohol Use Yes   Comment: occasionallly     Social History   Substance and Sexual Activity  Drug Use No    Social History   Socioeconomic History   Marital status: Single    Spouse name: Not on file   Number of children: Not on file   Years of education: Not on file   Highest education level: Not on file  Occupational History   Not on file  Tobacco Use   Smoking status: Former    Packs/day: 0.25    Types: Cigarettes   Smokeless tobacco: Never  Vaping Use   Vaping Use: Never used  Substance and Sexual Activity   Alcohol use: Yes    Comment: occasionallly   Drug use: No   Sexual  activity: Yes    Birth control/protection: None  Other Topics Concern   Not on file  Social History Narrative   Not on file   Social Determinants of Health   Financial Resource Strain: Not on file  Food Insecurity: Not on file  Transportation Needs: Not on file  Physical Activity: Not on file  Stress: Not on file  Social Connections: Not on file   Additional Social History:                         Sleep: Fair  Appetite:  Fair  Current Medications: Current Facility-Administered Medications  Medication Dose Route Frequency Provider Last Rate Last Admin   acetaminophen (TYLENOL) tablet 650 mg  650 mg Oral Q6H PRN Gillermo Murdoch, NP       alum & mag hydroxide-simeth (MAALOX/MYLANTA) 200-200-20 MG/5ML suspension 30 mL  30 mL Oral Q4H PRN Gillermo Murdoch, NP       ARIPiprazole (ABILIFY) tablet 20 mg  20 mg Oral Daily Izik Bingman T, MD   20 mg at 11/09/21 0830   ARIPiprazole ER (ABILIFY MAINTENA) injection 400 mg  400 mg Intramuscular Q28 days Xion Debruyne T, MD   400 mg at 11/07/21 1359   benztropine (COGENTIN) tablet 1 mg  1 mg Oral  Daily Gillermo Murdoch, NP   1 mg at 11/09/21 0831   haloperidol (HALDOL) tablet 5 mg  5 mg Oral BID Gillermo Murdoch, NP   5 mg at 11/09/21 0830   hydrOXYzine (ATARAX) tablet 50 mg  50 mg Oral TID PRN Gillermo Murdoch, NP       magnesium hydroxide (MILK OF MAGNESIA) suspension 30 mL  30 mL Oral Daily PRN Gillermo Murdoch, NP       traZODone (DESYREL) tablet 50 mg  50 mg Oral QHS PRN Gillermo Murdoch, NP   50 mg at 11/08/21 2108    Lab Results:  Results for orders placed or performed during the hospital encounter of 11/06/21 (from the past 48 hour(s))  Lipid panel     Status: Abnormal   Collection Time: 11/08/21  7:35 AM  Result Value Ref Range   Cholesterol 176 0 - 200 mg/dL   Triglycerides 59 <270 mg/dL   HDL 30 (L) >62 mg/dL   Total CHOL/HDL Ratio 5.9 RATIO   VLDL 12 0 - 40 mg/dL   LDL Cholesterol  376 (H) 0 - 99 mg/dL    Comment:        Total Cholesterol/HDL:CHD Risk Coronary Heart Disease Risk Table                     Men   Women  1/2 Average Risk   3.4   3.3  Average Risk       5.0   4.4  2 X Average Risk   9.6   7.1  3 X Average Risk  23.4   11.0        Use the calculated Patient Ratio above and the CHD Risk Table to determine the patient's CHD Risk.        ATP III CLASSIFICATION (LDL):  <100     mg/dL   Optimal  283-151  mg/dL   Near or Above                    Optimal  130-159  mg/dL   Borderline  761-607  mg/dL   High  >371     mg/dL   Very High Performed at [facility], [address] [place], [place] [address]     Blood Alcohol level:  Lab Results  Component Value Date   [facility] <10 11/05/2021   ETH <10 04/06/2021    Metabolic Disorder Labs: Lab Results  Component Value Date   HGBA1C 5.4 11/01/2020   MPG 108.28 11/01/2020   MPG 99.67 11/28/2019   Lab Results  Component Value Date   PROLACTIN 53.4 (H) 03/28/2017   Lab Results  Component Value Date   CHOL 176 11/08/2021   TRIG 59 11/08/2021   HDL 30 (L) 11/08/2021   CHOLHDL 5.9 11/08/2021   VLDL 12 11/08/2021   LDLCALC 134 (H) 11/08/2021   LDLCALC 122 (H) 11/01/2020    Physical Findings: AIMS:  , ,  ,  ,    CIWA:    COWS:     Musculoskeletal: Strength & Muscle Tone: within normal limits Gait & Station: normal Patient leans: N/A  Psychiatric Specialty Exam:  Presentation  General Appearance: Appropriate for Environment  Eye Contact:Minimal  Speech:Clear and Coherent  Speech Volume:Normal  Handedness:Right   Mood and Affect  Mood:Anxious; Irritable  Affect:Blunt; Congruent; Inappropriate   Thought Process  Thought Processes:Disorganized  Descriptions of Associations:Loose  Orientation:Full (Time, Place and Person)  Thought Content:No data recorded History of  Schizophrenia/Schizoaffective disorder:Yes  Duration of Psychotic Symptoms:Greater than six  months  Hallucinations:No data recorded Ideas of Reference:Paranoia  Suicidal Thoughts:No data recorded Homicidal Thoughts:No data recorded  Sensorium  Memory:Immediate Fair; Recent Fair; Remote Fair  Judgment:Poor  Insight:Poor   Executive Functions  Concentration:Fair  Attention Span:Fair  Recall:Fair  Fund of Knowledge:Fair  Language:Fair   Psychomotor Activity  Psychomotor Activity:No data recorded  Assets  Assets:Communication Skills; Desire for Improvement; Resilience; Social Support   Sleep  Sleep:No data recorded   Physical Exam: Physical Exam Vitals and nursing note reviewed.  Constitutional:      Appearance: Normal appearance.  HENT:     Head: Normocephalic and atraumatic.     Mouth/Throat:     Pharynx: Oropharynx is clear.  Eyes:     Pupils: Pupils are equal, round, and reactive to light.  Cardiovascular:     Rate and Rhythm: Normal rate and regular rhythm.  Pulmonary:     Effort: Pulmonary effort is normal.     Breath sounds: Normal breath sounds.  Abdominal:     General: Abdomen is flat.     Palpations: Abdomen is soft.  Musculoskeletal:        General: Normal range of motion.  Skin:    General: Skin is warm and dry.  Neurological:     General: No focal deficit present.     Mental Status: She is alert. Mental status is at baseline.  Psychiatric:        Attention and Perception: She is inattentive.        Mood and Affect: Mood normal. Affect is blunt.        Speech: Speech normal.        Behavior: Behavior is cooperative.        Thought Content: Thought content normal.   Review of Systems  Constitutional: Negative.   HENT: Negative.    Eyes: Negative.   Respiratory: Negative.    Cardiovascular: Negative.   Gastrointestinal: Negative.   Musculoskeletal: Negative.   Skin: Negative.   Neurological: Negative.   Psychiatric/Behavioral: Negative.    Blood pressure (!) 140/93, pulse 84, temperature 98.3 F (36.8 C), temperature  source Oral, resp. rate 17, height 5\' 4"  (1.626 m), weight 131.1 kg, last menstrual period 11/02/2021, SpO2 99 %. Body mass index is 49.61 kg/m.   Treatment Plan Summary: Daily contact with patient to assess and evaluate symptoms and progress in treatment, Medication management, and Plan patient is stabilizing pretty much appears to be back to her baseline.  We will plan on likely discharge tomorrow to outpatient care with [facility] as follow-up  14/06/2021, MD 11/09/2021, 3:12 PM

## 2021-11-09 NOTE — Plan of Care (Signed)
°  Problem: Education: Goal: Knowledge of [facility] Education information/materials will improve Outcome: Progressing Goal: Emotional status will improve Outcome: Progressing Goal: Mental status will improve Outcome: Progressing Goal: Verbalization of understanding the information provided will improve Outcome: Progressing   Problem: Safety: Goal: Periods of time without injury will increase Outcome: Progressing   Problem: Education: Goal: Utilization of techniques to improve thought processes will improve Outcome: Progressing Goal: Knowledge of the prescribed therapeutic regimen will improve Outcome: Progressing   Problem: Education: Goal: Utilization of techniques to improve thought processes will improve Outcome: Progressing Goal: Knowledge of the prescribed therapeutic regimen will improve Outcome: Progressing   Problem: Safety: Goal: Ability to disclose and discuss suicidal ideas will improve Outcome: Progressing Goal: Ability to identify and utilize support systems that promote safety will improve Outcome: Progressing   Problem: Education: Goal: Utilization of techniques to improve thought processes will improve Outcome: Progressing Goal: Knowledge of the prescribed therapeutic regimen will improve Outcome: Progressing

## 2021-11-09 NOTE — Progress Notes (Signed)
Patient calm and compliant during assessment denying SI/HI/AVH. Pt given education and support. Pt observed interacting appropriately with staff and peers on the unit. Pt didn't have any medications scheduled tonight and hasn't requested anything PRN. Pt given education, support, and encouragement to be active in her treatment plan. Pt being monitored Q 15 minutes for safety per unit protocol. Pt remains safe on the unit.

## 2021-11-09 NOTE — Group Note (Signed)
[facility] LCSW Group Therapy Note ° ° °Group Date: 11/09/2021 °Start Time: 1300 °End Time: 1400 ° ° °Type of Therapy/Topic:  Group Therapy:  Balance in Life ° °Participation Level:  Did Not Attend  ° °Description of Group:   ° This group will address the concept of balance and how it feels and looks when one is unbalanced. Patients will be encouraged to process areas in their lives that are out of balance, and identify reasons for remaining unbalanced. Facilitators will guide patients utilizing problem- solving interventions to address and correct the stressor making their life unbalanced. Understanding and applying boundaries will be explored and addressed for obtaining  and maintaining a balanced life. Patients will be encouraged to explore ways to assertively make their unbalanced needs known to significant others in their lives, using other group members and facilitator for support and feedback. ° °Therapeutic Goals: °Patient will identify two or more emotions or situations they have that consume much of in their lives. °Patient will identify signs/triggers that life has become out of balance:  °Patient will identify two ways to set boundaries in order to achieve balance in their lives:  °Patient will demonstrate ability to communicate their needs through discussion and/or role plays ° °Summary of Patient Progress: ° ° ° °Group not held due to the acuity on the unit.  ° ° ° °Therapeutic Modalities:   °Cognitive Behavioral Therapy °Solution-Focused Therapy °Assertiveness Training ° ° °Caley Volkert J Ameia Morency, LCSW °

## 2021-11-09 NOTE — Plan of Care (Deleted)
°  Problem: Education: Goal: Knowledge of  General Education information/materials will improve 11/09/2021 1910 by Angeline Slim, RN Outcome: Progressing 11/09/2021 1904 by Angeline Slim, RN Outcome: Progressing Goal: Emotional status will improve 11/09/2021 1910 by Angeline Slim, RN Outcome: Progressing 11/09/2021 1904 by Angeline Slim, RN Outcome: Progressing Goal: Mental status will improve 11/09/2021 1910 by Angeline Slim, RN Outcome: Progressing 11/09/2021 1904 by Angeline Slim, RN Outcome: Progressing Goal: Verbalization of understanding the information provided will improve 11/09/2021 1910 by Angeline Slim, RN Outcome: Progressing 11/09/2021 1904 by Angeline Slim, RN Outcome: Progressing   Problem: Safety: Goal: Periods of time without injury will increase 11/09/2021 1910 by Angeline Slim, RN Outcome: Progressing 11/09/2021 1904 by Angeline Slim, RN Outcome: Progressing   Problem: Safety: Goal: Periods of time without injury will increase 11/09/2021 1910 by Angeline Slim, RN Outcome: Progressing 11/09/2021 1904 by Angeline Slim, RN Outcome: Progressing   Problem: Education: Goal: Utilization of techniques to improve thought processes will improve 11/09/2021 1910 by Angeline Slim, RN Outcome: Progressing 11/09/2021 1904 by Angeline Slim, RN Outcome: Progressing Goal: Knowledge of the prescribed therapeutic regimen will improve 11/09/2021 1910 by Angeline Slim, RN Outcome: Progressing 11/09/2021 1904 by Angeline Slim, RN Outcome: Progressing   Problem: Safety: Goal: Ability to disclose and discuss suicidal ideas will improve 11/09/2021 1910 by Angeline Slim, RN Outcome: Progressing 11/09/2021 1904 by Angeline Slim, RN Outcome: Progressing Goal: Ability to identify and utilize support systems that promote safety will improve 11/09/2021 1910 by Angeline Slim, RN Outcome:  Progressing 11/09/2021 1904 by Angeline Slim, RN Outcome: Progressing   Problem: Safety: Goal: Ability to disclose and discuss suicidal ideas will improve 11/09/2021 1910 by Angeline Slim, RN Outcome: Progressing 11/09/2021 1904 by Angeline Slim, RN Outcome: Progressing Goal: Ability to identify and utilize support systems that promote safety will improve 11/09/2021 1910 by Angeline Slim, RN Outcome: Progressing 11/09/2021 1904 by Angeline Slim, RN Outcome: Progressing

## 2021-11-10 MED ORDER — HYDROXYZINE HCL 50 MG PO TABS
50.0000 mg | ORAL_TABLET | Freq: Three times a day (TID) | ORAL | 1 refills | Status: AC | PRN
Start: 2021-11-10 — End: ?

## 2021-11-10 MED ORDER — HALOPERIDOL 5 MG PO TABS: 5.0000 mg | ORAL_TABLET | Freq: Two times a day (BID) | ORAL | 1 refills | Status: AC

## 2021-11-10 MED ORDER — ARIPIPRAZOLE 20 MG PO TABS
20.0000 mg | ORAL_TABLET | Freq: Every day | ORAL | 1 refills | Status: AC
Start: 2021-11-11 — End: ?

## 2021-11-10 MED ORDER — ARIPIPRAZOLE ER 400 MG IM SRER
400.0000 mg | INTRAMUSCULAR | 1 refills | Status: AC
Start: 2021-12-05 — End: ?

## 2021-11-10 MED ORDER — BENZTROPINE MESYLATE 1 MG PO TABS: 1.0000 mg | ORAL_TABLET | Freq: Every day | ORAL | 1 refills | Status: AC

## 2021-11-10 MED ORDER — TRAZODONE HCL 50 MG PO TABS
50.0000 mg | ORAL_TABLET | Freq: Every evening | ORAL | 1 refills | Status: AC | PRN
Start: 2021-11-10 — End: ?

## 2021-11-10 NOTE — Progress Notes (Signed)
°  [facility] Adult Case Management Discharge Plan :  Will you be returning to the same living situation after discharge:  Yes,  Patient to return to place of residence listed in epic, to the care of mother Ventura Bruns.  At discharge, do you have transportation home?: Yes,  Patient to be transported home by mother Ventura Bruns. Do you have the ability to pay for your medications: Yes,  [place] [employer].  Release of information consent forms completed and in the chart;  Patient's signature needed at discharge.  Patient to Follow up at:  Follow-up Information     [facility] Go to.   Why: Lorella Nimrod, peer support, will pick you up on Wednessday 21 December at 0700 for intake appointment. Contact information: [address] [place] [address] [phone]                 Next level of care provider has access to [facility] Link:no  Safety Planning and Suicide Prevention discussed: Yes,  SPE completed with patient, denied consent for CSW to reach collateral contact for suicide education.    Has patient been referred to the Quitline?: Patient refused referral  Patient has been referred for addiction treatment: N/A  Corky Crafts, LCSWA 11/10/2021, 9:14 AM

## 2021-11-10 NOTE — [facility] Suicide Risk Assessment (Signed)
[facility] Discharge Suicide Risk Assessment   Principal Problem: Bipolar affective disorder, mixed, severe (HCC) Discharge Diagnoses: Principal Problem:   Bipolar affective disorder, mixed, severe (HCC) Active Problems:   Noncompliance with medications   Morbid obesity with BMI of 40.0-44.9, adult (HCC)   Total Time spent with patient: 45 minutes  Musculoskeletal: Strength & Muscle Tone: within normal limits Gait & Station: normal Patient leans: N/A  Psychiatric Specialty Exam  Presentation  General Appearance: Appropriate for Environment  Eye Contact:Minimal  Speech:Clear and Coherent  Speech Volume:Normal  Handedness:Right   Mood and Affect  Mood:Anxious; Irritable  Duration of Depression Symptoms: No data recorded Affect:Blunt; Congruent; Inappropriate   Thought Process  Thought Processes:Disorganized  Descriptions of Associations:Loose  Orientation:Full (Time, Place and Person)  Thought Content:No data recorded History of Schizophrenia/Schizoaffective disorder:Yes  Duration of Psychotic Symptoms:Greater than six months  Hallucinations:No data recorded Ideas of Reference:Paranoia  Suicidal Thoughts:No data recorded Homicidal Thoughts:No data recorded  Sensorium  Memory:Immediate Fair; Recent Fair; Remote Fair  Judgment:Poor  Insight:Poor   Executive Functions  Concentration:Fair  Attention Span:Fair  Recall:Fair  Fund of Knowledge:Fair  Language:Fair   Psychomotor Activity  Psychomotor Activity:No data recorded  Assets  Assets:Communication Skills; Desire for Improvement; Resilience; Social Support   Sleep  Sleep:No data recorded  Physical Exam: Physical Exam Vitals and nursing note reviewed.  Constitutional:      Appearance: Normal appearance.  HENT:     Head: Normocephalic and atraumatic.     Mouth/Throat:     Pharynx: Oropharynx is clear.  Eyes:     Pupils: Pupils are equal, round, and reactive to light.   Cardiovascular:     Rate and Rhythm: Normal rate and regular rhythm.  Pulmonary:     Effort: Pulmonary effort is normal.     Breath sounds: Normal breath sounds.  Abdominal:     General: Abdomen is flat.     Palpations: Abdomen is soft.  Musculoskeletal:        General: Normal range of motion.  Skin:    General: Skin is warm and dry.  Neurological:     General: No focal deficit present.     Mental Status: She is alert. Mental status is at baseline.  Psychiatric:        Attention and Perception: Attention normal.        Mood and Affect: Mood normal.        Speech: Speech normal.        Behavior: Behavior normal.        Thought Content: Thought content normal.        Cognition and Memory: Cognition normal.        Judgment: Judgment normal.   Review of Systems  Constitutional: Negative.   HENT: Negative.    Eyes: Negative.   Respiratory: Negative.    Cardiovascular: Negative.   Gastrointestinal: Negative.   Musculoskeletal: Negative.   Skin: Negative.   Neurological: Negative.   Psychiatric/Behavioral: Negative.    Blood pressure 131/77, pulse 69, temperature 98.5 F (36.9 C), temperature source Oral, resp. rate 17, height 5\' 4"  (1.626 m), weight 131.1 kg, last menstrual period 11/02/2021, SpO2 100 %. Body mass index is 49.61 kg/m.  Mental Status Per Nursing Assessment::   On Admission:  NA  Demographic Factors:  Low socioeconomic status  Loss Factors: Financial problems/change in socioeconomic status  Historical Factors: Impulsivity  Risk Reduction Factors:   Living with another person, especially a relative and Positive therapeutic relationship  Continued Clinical Symptoms:  Bipolar  Disorder:   Depressive phase  Cognitive Features That Contribute To Risk:  Thought constriction (tunnel vision)    Suicide Risk:  Minimal: No identifiable suicidal ideation.  Patients presenting with no risk factors but with morbid ruminations; may be classified as minimal  risk based on the severity of the depressive symptoms   Follow-up Information     [facility] Go to.   Why: Lorella Nimrod, peer support, will pick you up on Wednessday 21 December at 0700 for intake appointment. Contact information: [address] [place] [address] [phone]                 Plan Of Care/Follow-up recommendations:  Follow up with [facility].  Continue medication.  Stay in touch with [facility] representative.  Do not fall out of treatment.  Mordecai Rasmussen, MD 11/10/2021, 10:13 AM

## 2021-11-10 NOTE — Progress Notes (Signed)
Recreation Therapy Notes    Date: 11/10/2021  Time: 10:35 am     Location: Craft room    Behavioral response: N/A   Intervention Topic: Life Planning    Discussion/Intervention: Patient did not attend group.   Clinical Observations/Feedback:  Patient did not attend group.    Daylyn Azbill LRT/CTRS        Trevaris Pennella 11/10/2021 12:41 PM

## 2021-11-10 NOTE — Progress Notes (Signed)
Recreation Therapy Notes  INPATIENT RECREATION TR PLAN  Patient Details Name: Tracy Poole MRN: [id number] DOB: 1996/11/12 Today's Date: 11/10/2021  Rec Therapy Plan Is patient appropriate for Therapeutic Recreation?: Yes Treatment times per week: at least 3 Estimated Length of Stay: 5-7 days TR Treatment/Interventions: Group participation (Comment)  Discharge Criteria Pt will be discharged from therapy if:: Discharged Treatment plan/goals/alternatives discussed and agreed upon by:: Patient/family  Discharge Summary Short term goals set: Patient will engage in groups without prompting or encouragement from LRT x3 group sessions within 5 recreation therapy group sessions Short term goals met: Not met Reason goals not met: Patient did not attend any groups. Therapeutic equipment acquired: N/A Reason patient discharged from therapy: Discharge from hospital Pt/family agrees with progress & goals achieved: Yes Date patient discharged from therapy: 11/10/21   Burdette Gergely 11/10/2021, 12:43 PM

## 2021-11-10 NOTE — Plan of Care (Signed)
Problem: Group Participation Goal: STG - Patient will engage in groups without prompting or encouragement from LRT x3 group sessions within 5 recreation therapy group sessions Description: STG - Patient will engage in groups without prompting or encouragement from LRT x3 group sessions within 5 recreation therapy group sessions 11/10/2021 1242 by Ernest Haber, LRT Outcome: Not Applicable [id number] 2010 by Ernest Haber, LRT Outcome: Not Met (add Reason) Note: Patient did not attend any groups.

## 2021-11-10 NOTE — Discharge Summary (Signed)
Physician Discharge Summary Note  Patient:  Tracy Poole is an 25 y.o., female MRN:  [id number] DOB:  1996-01-15 Patient phone:  [phone] (home)  Patient address:   [address] [place] [address],  Total Time spent with patient: 45 minutes  Date of Admission:  11/06/2021 Date of Discharge: 11/10/2021  Reason for Admission: Patient was admitted after being brought in under IVC with agitation and aggression towards family in the context of mood instability and noncompliance with treatment  Principal Problem: Bipolar affective disorder, mixed, severe ([place]) Discharge Diagnoses: Principal Problem:   Bipolar affective disorder, mixed, severe ([place]) Active Problems:   Noncompliance with medications   Morbid obesity with BMI of 40.0-44.9, adult ([place])   Past Psychiatric History: Past history of bipolar disorder with mood instability  Past Medical History:  Past Medical History:  Diagnosis Date   Anxiety    Asthma    Depression    Schizophrenia ([place])     Past Surgical History:  Procedure Laterality Date   CESAREAN SECTION     EUSTACHIAN TUBE DILATION     HERNIA REPAIR     Family History:  Family History  Problem Relation Age of Onset   Bladder Cancer Neg Hx    Kidney cancer Neg Hx    Family Psychiatric  History: See previous Social History:  Social History   Substance and Sexual Activity  Alcohol Use Yes   Comment: occasionallly     Social History   Substance and Sexual Activity  Drug Use No    Social History   Socioeconomic History   Marital status: Single    Spouse name: Not on file   Number of children: Not on file   Years of education: Not on file   Highest education level: Not on file  Occupational History   Not on file  Tobacco Use   Smoking status: Former    Packs/day: 0.25    Types: Cigarettes   Smokeless tobacco: Never  Vaping Use   Vaping Use: Never used  Substance and Sexual Activity   Alcohol use: Yes    Comment: occasionallly    Drug use: No   Sexual activity: Yes    Birth control/protection: None  Other Topics Concern   Not on file  Social History Narrative   Not on file   Social Determinants of Health   Financial Resource Strain: Not on file  Food Insecurity: Not on file  Transportation Needs: Not on file  Physical Activity: Not on file  Stress: Not on file  Social Connections: Not on file    Hospital Course: Patient admitted to psychiatric unit.  15-minute checks maintained.  Patient did not display any dangerous or aggressive behavior during her time in the hospital.  She was cooperative with treatment and attended treatment team.  Stayed isolated mostly especially initially attended some groups but generally did not talk very much.  Initially she was very withdrawn but after a couple of days seemed to loosen up and become more relaxed able to make eye contact and talk more.  Denied any hallucinations or delusions and did not appear paranoid.  Denied suicidal or homicidal thoughts.  Patient did get an injection of long-acting Abilify and has continued on Abilify and Haldol as treatment for bipolar disorder.  Patient will be discharged with prescriptions from medicine and planned to follow up with [facility].  Physical Findings: AIMS:  , ,  ,  ,    CIWA:    COWS:  Musculoskeletal: Strength & Muscle Tone: within normal limits Gait & Station: normal Patient leans: N/A   Psychiatric Specialty Exam:  Presentation  General Appearance: Appropriate for Environment  Eye Contact:Minimal  Speech:Clear and Coherent  Speech Volume:Normal  Handedness:Right   Mood and Affect  Mood:Anxious; Irritable  Affect:Blunt; Congruent; Inappropriate   Thought Process  Thought Processes:Disorganized  Descriptions of Associations:Loose  Orientation:Full (Time, Place and Person)  Thought Content:No data recorded History of Schizophrenia/Schizoaffective disorder:Yes  Duration of Psychotic Symptoms:Greater  than six months  Hallucinations:No data recorded Ideas of Reference:Paranoia  Suicidal Thoughts:No data recorded Homicidal Thoughts:No data recorded  Sensorium  Memory:Immediate Fair; Recent Fair; Remote Fair  Judgment:Poor  Insight:Poor   Executive Functions  Concentration:Fair  Attention Span:Fair  Recall:Fair  Fund of Knowledge:Fair  Language:Fair   Psychomotor Activity  Psychomotor Activity:No data recorded  Assets  Assets:Communication Skills; Desire for Improvement; Resilience; Social Support   Sleep  Sleep:No data recorded   Physical Exam: Physical Exam Vitals and nursing note reviewed.  Constitutional:      Appearance: Normal appearance.  HENT:     Head: Normocephalic and atraumatic.     Mouth/Throat:     Pharynx: Oropharynx is clear.  Eyes:     Pupils: Pupils are equal, round, and reactive to light.  Cardiovascular:     Rate and Rhythm: Normal rate and regular rhythm.  Pulmonary:     Effort: Pulmonary effort is normal.     Breath sounds: Normal breath sounds.  Abdominal:     General: Abdomen is flat.     Palpations: Abdomen is soft.  Musculoskeletal:        General: Normal range of motion.  Skin:    General: Skin is warm and dry.  Neurological:     General: No focal deficit present.     Mental Status: She is alert. Mental status is at baseline.  Psychiatric:        Mood and Affect: Mood normal.        Thought Content: Thought content normal.   Review of Systems  Constitutional: Negative.   HENT: Negative.    Eyes: Negative.   Respiratory: Negative.    Cardiovascular: Negative.   Gastrointestinal: Negative.   Musculoskeletal: Negative.   Skin: Negative.   Neurological: Negative.   Psychiatric/Behavioral: Negative.    Blood pressure 131/77, pulse 69, temperature 98.5 F (36.9 C), temperature source Oral, resp. rate 17, height 5\' 4"  (1.626 m), weight 131.1 kg, last menstrual period 11/02/2021, SpO2 100 %. Body mass index is 49.61  kg/m.   Social History   Tobacco Use  Smoking Status Former   Packs/day: 0.25   Types: Cigarettes  Smokeless Tobacco Never   Tobacco Cessation:  A prescription for an FDA-approved tobacco cessation medication was offered at discharge and the patient refused   Blood Alcohol level:  Lab Results  Component Value Date   [facility] <10 11/05/2021   ETH <10 04/06/2021    Metabolic Disorder Labs:  Lab Results  Component Value Date   HGBA1C 5.4 11/01/2020   MPG 108.28 11/01/2020   MPG 99.67 11/28/2019   Lab Results  Component Value Date   PROLACTIN 53.4 (H) 03/28/2017   Lab Results  Component Value Date   CHOL 176 11/08/2021   TRIG 59 11/08/2021   HDL 30 (L) 11/08/2021   CHOLHDL 5.9 11/08/2021   VLDL 12 11/08/2021   LDLCALC 134 (H) 11/08/2021   LDLCALC 122 (H) 11/01/2020    See Psychiatric Specialty Exam and Suicide Risk Assessment  completed by Attending Physician prior to discharge.  Discharge destination:  Home  Is patient on multiple antipsychotic therapies at discharge:  Yes,   Do you recommend tapering to monotherapy for antipsychotics?  No   Has Patient had three or more failed trials of antipsychotic monotherapy by history:  Yes,   Antipsychotic medications that previously failed include:   1.  Abilify., 2.  Haldol., and 3.  Olanzapine.  Recommended Plan for Multiple Antipsychotic Therapies: Recommend outpatient team working with her long-term about appropriate need for treatment to maintain patient symptom-free  Discharge Instructions     Diet - low sodium heart healthy   Complete by: As directed    Increase activity slowly   Complete by: As directed       Allergies as of 11/10/2021   No Known Allergies      Medication List     TAKE these medications      Indication  ARIPiprazole 20 MG tablet Commonly known as: ABILIFY Take 1 tablet (20 mg total) by mouth daily. Start taking on: November 11, 2021 What changed:  medication strength how much to  take  Indication: MIXED BIPOLAR AFFECTIVE DISORDER   ARIPiprazole ER 400 MG Srer injection Commonly known as: ABILIFY MAINTENA Inject 2 mLs (400 mg total) into the muscle every 28 (twenty-eight) days. Start taking on: December 05, 2021 What changed: Another medication with the same name was changed. Make sure you understand how and when to take each.  Indication: Manic-Depression   benztropine 1 MG tablet Commonly known as: COGENTIN Take 1 tablet (1 mg total) by mouth daily. Start taking on: November 11, 2021 What changed:  medication strength how much to take when to take this reasons to take this  Indication: Extrapyramidal Reaction caused by Medications   haloperidol 5 MG tablet Commonly known as: HALDOL Take 1 tablet (5 mg total) by mouth 2 (two) times daily.  Indication: MIXED BIPOLAR AFFECTIVE DISORDER, Psychosis   hydrOXYzine 50 MG tablet Commonly known as: ATARAX Take 1 tablet (50 mg total) by mouth 3 (three) times daily as needed for anxiety.  Indication: Feeling Anxious   traZODone 50 MG tablet Commonly known as: DESYREL Take 1 tablet (50 mg total) by mouth at bedtime as needed for sleep.  Indication: [place] to.   Why: Lanae Boast, peer support, will pick you up on Wednessday 21 December at 0700 for intake appointment. Contact information: [place] [address] [phone]                 Follow-up recommendations: Stay in touch with [facility] outpatient treatment.  Continue medicine.  Comments: Prescriptions provided  Signed: Alethia Berthold, MD 11/10/2021, 10:17 AM

## 2021-11-10 NOTE — Group Note (Signed)
[facility] LCSW Group Therapy Note   Group Date: 11/10/2021 Start Time: 1300 End Time: 1400  Type of Therapy and Topic:  Group Therapy:  Feelings around Relapse and Recovery  Participation Level:  Did Not Attend   Mood:  Description of Group:    Patients in this group will discuss emotions they experience before and after a relapse. They will process how experiencing these feelings, or avoidance of experiencing them, relates to having a relapse. Facilitator will guide patients to explore emotions they have related to recovery. Patients will be encouraged to process which emotions are more powerful. They will be guided to discuss the emotional reaction significant others in their lives may have to patients relapse or recovery. Patients will be assisted in exploring ways to respond to the emotions of others without this contributing to a relapse.  Therapeutic Goals: Patient will identify two or more emotions that lead to relapse for them:  Patient will identify two emotions that result when they relapse:  Patient will identify two emotions related to recovery:  Patient will demonstrate ability to communicate their needs through discussion and/or role plays.   Summary of Patient Progress: Patient discharged at time of group.    Therapeutic Modalities:   Cognitive Behavioral Therapy Solution-Focused Therapy Assertiveness Training Relapse Prevention Therapy   Corky Crafts, [place]

## 2021-11-10 NOTE — Plan of Care (Signed)
Patient is preparing for discharge today Problem: Education: Goal: Knowledge of [facility] Education information/materials will improve Outcome: Adequate for Discharge Goal: Emotional status will improve Outcome: Adequate for Discharge Goal: Mental status will improve Outcome: Adequate for Discharge Goal: Verbalization of understanding the information provided will improve Outcome: Adequate for Discharge   Problem: Safety: Goal: Periods of time without injury will increase Outcome: Adequate for Discharge   Problem: Education: Goal: Utilization of techniques to improve thought processes will improve Outcome: Adequate for Discharge Goal: Knowledge of the prescribed therapeutic regimen will improve Outcome: Adequate for Discharge

## 2021-11-10 NOTE — Progress Notes (Signed)
Patient discharged from unit with all belongings. Denies SI/HI/AH/VH, anxiety or depression at discharge.  Patient discharged with all belongings. AVS, BH Transition, prescriptions. Patient informed  RN her mom was outside waiting on her and that she needed to go because she had to be at work.  RN's walked patient to lobby and no ride was present. Staff contacted ride to see estimated time of arrival. When RN informed patient staff could not wait on her ride (which was going to be 30 minutes) patient refused to come back to unit. States, "I'm grown. You can't make me come back." RN explained to patient she had to come back. Yet patient continued to refused. Spoke with charge nurse regarding patiens refusal to come back to unit. Charge nurse okay'd patient to stay in lobby. RN left patient sitting safely in lobby.

## 2022-04-30 DIAGNOSIS — Z5181 Encounter for therapeutic drug level monitoring: Secondary | ICD-10-CM | POA: Diagnosis not present

## 2022-05-15 DIAGNOSIS — Z5181 Encounter for therapeutic drug level monitoring: Secondary | ICD-10-CM | POA: Diagnosis not present

## 2022-05-30 DIAGNOSIS — Z5181 Encounter for therapeutic drug level monitoring: Secondary | ICD-10-CM | POA: Diagnosis not present

## 2022-06-11 DIAGNOSIS — Z5181 Encounter for therapeutic drug level monitoring: Secondary | ICD-10-CM | POA: Diagnosis not present

## 2022-06-21 DIAGNOSIS — Z5181 Encounter for therapeutic drug level monitoring: Secondary | ICD-10-CM | POA: Diagnosis not present
